# Patient Record
Sex: Male | Born: 1945 | Race: White | Hispanic: No | Smoking: Former smoker
Health system: Southern US, Community
[De-identification: ages and names within clinical notes are randomized; demographics above are authoritative.]

## PROBLEM LIST (undated history)

## (undated) DIAGNOSIS — K219 Gastro-esophageal reflux disease without esophagitis: Secondary | ICD-10-CM

## (undated) DIAGNOSIS — I10 Essential (primary) hypertension: Secondary | ICD-10-CM

## (undated) DIAGNOSIS — E785 Hyperlipidemia, unspecified: Secondary | ICD-10-CM

## (undated) DIAGNOSIS — H3320 Serous retinal detachment, unspecified eye: Secondary | ICD-10-CM

## (undated) DIAGNOSIS — M1990 Unspecified osteoarthritis, unspecified site: Secondary | ICD-10-CM

## (undated) DIAGNOSIS — R911 Solitary pulmonary nodule: Secondary | ICD-10-CM

## (undated) DIAGNOSIS — R32 Unspecified urinary incontinence: Secondary | ICD-10-CM

## (undated) HISTORY — PX: RETINAL DETACHMENT SURGERY: SHX105

## (undated) HISTORY — PX: CATARACT EXTRACTION: SUR2

## (undated) HISTORY — PX: EYE SURGERY: SHX253

## (undated) HISTORY — PX: TONSILLECTOMY: SUR1361

## (undated) HISTORY — PX: HERNIA REPAIR: SHX51

---

## 2014-04-19 HISTORY — PX: HERNIA REPAIR: SHX51

## 2014-06-26 ENCOUNTER — Other Ambulatory Visit: Payer: Self-pay | Admitting: Family Medicine

## 2014-06-26 DIAGNOSIS — I159 Secondary hypertension, unspecified: Secondary | ICD-10-CM

## 2014-06-26 DIAGNOSIS — Z87891 Personal history of nicotine dependence: Secondary | ICD-10-CM

## 2014-06-26 DIAGNOSIS — Z136 Encounter for screening for cardiovascular disorders: Secondary | ICD-10-CM

## 2014-07-02 ENCOUNTER — Ambulatory Visit
Admission: RE | Admit: 2014-07-02 | Discharge: 2014-07-02 | Disposition: A | Discharge status: Home / Self Care | Payer: 59 | Source: Ambulatory Visit | Attending: Family Medicine | Admitting: Family Medicine

## 2014-07-02 DIAGNOSIS — Z87891 Personal history of nicotine dependence: Secondary | ICD-10-CM

## 2014-07-02 DIAGNOSIS — Z136 Encounter for screening for cardiovascular disorders: Secondary | ICD-10-CM

## 2014-07-02 DIAGNOSIS — I159 Secondary hypertension, unspecified: Secondary | ICD-10-CM

## 2014-07-26 NOTE — Progress Notes (Signed)
Please put orders in Epic surgery 08-08-14 pre op 07-31-14 Thanks

## 2014-07-30 ENCOUNTER — Ambulatory Visit: Payer: Self-pay | Admitting: General Surgery

## 2014-07-30 NOTE — H&P (Signed)
Johnny Peck. Fricke 06/27/2014 10:30 AM Location: [facility] Patient #: [id number] DOB: 04-07-1946 Married / Language: English / Race: White Male  History of Present Illness Johnny Peck M. Carra Brindley MD; 06/29/2014 2:34 PM) Patient words: hernia.  The patient is a 69 year old male who presents with an inguinal hernia. He is referred by Dr. Kathyrn Lass for evaluation of a left inguinal hernia. He moved to the area a few months ago. He reports that he developed left groin pain before Christmas. He was picking up a motor for his boat when he felt pain in his left groin. He obtained a hernia belt which decrease the discomfort. The discomfort persisted and he saw a primary care physician who could not really feel a hernia per the patient. He states that he was told he may have a groin pull. He stopped wearing the hernia belt and he states that the bulge returned. The bulge is noticeable when he is on his feet for prolonged period of time. He also describes increased pain with standing or walking or when straining to have a bowel movement. He describes the pain as a burning pain. It does not radiate. He denies any fever, chills, nausea, vomiting, diarrhea or constipation. He does take Flomax. He denies any chest pain, chest pressure, shortness of breath, dyspnea on exertion, TIAs or amaurosis fugax. He has had a prior open repair inthe 80s he thinks on the right side   Other Problems [name], MD; 06/29/2014 2:35 PM) Arthritis Back Pain High blood pressure Hypercholesterolemia Inguinal Hernia Other disease, cancer, significant illness LEFT INGUINAL HERNIA (550.90  K40.90)  Past Surgical History ([name], LPN; 09/14/4130 44:01 AM) Open Inguinal Hernia Surgery Left. Tonsillectomy  Diagnostic Studies History ([name], LPN; 0/27/2536 64:40 AM) Colonoscopy 5-10 years ago  Allergies ([name], LPN; 3/47/4259 56:38 AM) No Known Drug  Allergies03/01/2015  Medication History ([name], LPN; 7/56/4332 95:18 AM) Omeprazole (10MG  Capsule DR, Oral) Active. Lisinopril (10MG  Tablet, Oral) Active. Crestor (10MG  Tablet, Oral) Active. Mobic (15MG  Tablet, Oral) Active. Flomax (0.4MG  Capsule ER 24HR, Oral) Active. Cialis (2.5MG  Tablet, Oral) Active.  Social History ([name], LPN; 8/41/6606 30:16 AM) Alcohol use Occasional alcohol use. Caffeine use Carbonated beverages, Tea. Illicit drug use Prefer to discuss with provider. Tobacco use Former smoker.  Family History Aleatha Borer, LPN; 0/01/9322 55:73 AM) Arthritis Father, Mother. Heart Disease Father, Mother. Hypertension Father, Mother. Prostate Cancer Father.  Review of Systems ([name] LPN; 06/08/2540 70:62 AM) General Not Present- Appetite Loss, Chills, Fatigue, Fever, Night Sweats, Weight Gain and Weight Loss. Skin Not Present- Change in Wart/Mole, Dryness, Hives, Jaundice, New Lesions, Non-Healing Wounds, Rash and Ulcer. HEENT Not Present- Earache, Hearing Loss, Hoarseness, Nose Bleed, Oral Ulcers, Ringing in the Ears, Seasonal Allergies, Sinus Pain, Sore Throat, Visual Disturbances, Wears glasses/contact lenses and Yellow Eyes. Respiratory Not Present- Bloody sputum, Chronic Cough, Difficulty Breathing, Snoring and Wheezing. Cardiovascular Not Present- Chest Pain, Difficulty Breathing Lying Down, Leg Cramps, Palpitations, Rapid Heart Rate, Shortness of Breath and Swelling of Extremities. Gastrointestinal Present- Abdominal Pain. Not Present- Bloating, Bloody Stool, Change in Bowel Habits, Chronic diarrhea, Constipation, Difficulty Swallowing, Excessive gas, Gets full quickly at meals, Hemorrhoids, Indigestion, Nausea, Rectal Pain and Vomiting. Male Genitourinary Present- Urgency and Urine Leakage. Not Present- Blood in Urine, Change in Urinary Stream, Frequency, Impotence, Nocturia and Painful Urination. Musculoskeletal Present- Back  Pain and Joint Pain. Not Present- Joint Stiffness, Muscle Pain, Muscle Weakness and Swelling of Extremities. Neurological Not Present- Decreased Memory, Fainting,  Headaches, Numbness, Seizures, Tingling, Tremor, Trouble walking and Weakness. Psychiatric Not Present- Anxiety, Bipolar, Change in Sleep Pattern, Depression, Fearful and Frequent crying. Endocrine Not Present- Cold Intolerance, Excessive Hunger, Hair Changes, Heat Intolerance, Hot flashes and New Diabetes. Hematology Not Present- Easy Bruising, Excessive bleeding, Gland problems, HIV and Persistent Infections.   Vitals ([name] LPN; 6/50/3546 56:81 AM) 06/27/2014 10:38 AM Weight: 182.4 lb Height: 69.5in Body Surface Area: 2.01 m Body Mass Index: 26.55 kg/m Temp.: 97.12F(Oral)  Pulse: 59 (Regular)  BP: 122/74 (Sitting, Left Arm, Standard)    Physical Exam Johnny Peck M. Kirsten Spearing MD; 06/29/2014 2:31 PM) General Mental Status-Alert. General Appearance-Consistent with stated age. Hydration-Well hydrated. Voice-Normal.  Head and Neck Head-normocephalic, atraumatic with no lesions or palpable masses. Trachea-midline. Thyroid Gland Characteristics - normal size and consistency.  Eye Eyeball - Bilateral-Extraocular movements intact. Sclera/Conjunctiva - Bilateral-No scleral icterus.  Chest and Lung Exam Chest and lung exam reveals -quiet, even and easy respiratory effort with no use of accessory muscles and on auscultation, normal breath sounds, no adventitious sounds and normal vocal resonance. Inspection Chest Wall - Normal. Back - normal.  Breast - Did not examine.  Cardiovascular Cardiovascular examination reveals -normal heart sounds, regular rate and rhythm with no murmurs and normal pedal pulses bilaterally.  Abdomen Inspection Inspection of the abdomen reveals - No Hernias. Skin - Scar - no surgical scars. Palpation/Percussion Palpation and Percussion of the abdomen reveal -  Soft, Non Tender, No Rebound tenderness, No Rigidity (guarding) and No hepatosplenomegaly. Auscultation Auscultation of the abdomen reveals - Bowel sounds normal.  Male Genitourinary Note: both testicles down; old right inguinal incision; no obvious bulge supine. pt examined supine and standing. on standing with valsalva, there is lateral LEFT inguinal bulge. reducible.   Peripheral Vascular Upper Extremity Palpation - Pulses bilaterally normal.  Neurologic Neurologic evaluation reveals -alert and oriented x 3 with no impairment of recent or remote memory. Mental Status-Normal.  Neuropsychiatric The patient's mood and affect are described as -normal. Judgment and Insight-insight is appropriate concerning matters relevant to self.  Musculoskeletal Normal Exam - Left-Upper Extremity Strength Normal and Lower Extremity Strength Normal. Normal Exam - Right-Upper Extremity Strength Normal and Lower Extremity Strength Normal.  Lymphatic Head & Neck  General Head & Neck Lymphatics: Bilateral - Description - Normal. Axillary - Did not examine. Femoral & Inguinal - Did not examine.    Assessment & Plan Johnny Peck M. Kenzy Campoverde MD; 06/27/2014 11:14 AM) LEFT INGUINAL HERNIA (550.90  K40.90) Current Plans  We discussed the etiology of inguinal hernias. We discussed the signs & symptoms of incarceration & strangulation. We discussed non-operative and operative management. We also discussed open and laparoscopic approaches.  I described laparoscopic inguinal hernia repair procedure in detail. The patient was given educational material. We discussed the risks and benefits including but not limited to bleeding, infection, chronic inguinal pain, nerve entrapment, hernia recurrence, mesh complications, hematoma formation, urinary retention, injury to the testicles, numbness in the groin, blood clots, injury to the surrounding structures, and anesthesia risk. We also discussed the typical  post operative recovery course, including no heavy lifting for 4-6 weeks. I explained that the likelihood of improvement of their symptoms is good  Take your flomax the morning of surgery Provided with written educational materials Schedule for Surgery  [name]. Redmond Pulling, MD, FACS General, Bariatric, & Minimally Invasive Surgery [facility], [place]

## 2014-07-30 NOTE — Patient Instructions (Addendum)
Johnny Peck  07/30/2014   Your procedure is scheduled on: 08/08/2014    Report to [facility] Main  Entrance and follow signs to               [name] at    0700 AM.  Call this number if you have problems the morning of surgery [phone]   Remember:  Do not eat food or drink liquids :After Midnight.     Take these medicines the morning of surgery with A SIP OF WATER:   Toprol, Prilosec                                You may not have any metal on your body including hair pins and              piercings  Do not wear jewelry,  lotions, powders or perfumes., deodorant.                            Men may shave face and neck.   Do not bring valuables to the hospital. [place].  Contacts, dentures or bridgework may not be worn into surgery.       Patients discharged the day of surgery will not be allowed to drive home.  Name and phone number of your driver:  Special Instructions: coughing and deep breathing exercises, leg exercises               Please read over the following fact sheets you were given: _____________________________________________________________________             [facility] - Preparing for Surgery Before surgery, you can play an important role.  Because skin is not sterile, your skin needs to be as free of germs as possible.  You can reduce the number of germs on your skin by washing with CHG (chlorahexidine gluconate) soap before surgery.  CHG is an antiseptic cleaner which kills germs and bonds with the skin to continue killing germs even after washing. Please DO NOT use if you have an allergy to CHG or antibacterial soaps.  If your skin becomes reddened/irritated stop using the CHG and inform your nurse when you arrive at Short Stay. Do not shave (including legs and underarms) for at least 48 hours prior to the first CHG shower.  You may shave your face/neck. Please follow  these instructions carefully:  1.  Shower with CHG Soap the night before surgery and the  morning of Surgery.  2.  If you choose to wash your hair, wash your hair first as usual with your  normal  shampoo.  3.  After you shampoo, rinse your hair and body thoroughly to remove the  shampoo.                           4.  Use CHG as you would any other liquid soap.  You can apply chg directly  to the skin and wash                       Gently with a scrungie or clean washcloth.  5.  Apply the  CHG Soap to your body ONLY FROM THE NECK DOWN.   Do not use on face/ open                           Wound or open sores. Avoid contact with eyes, ears mouth and genitals (private parts).                       Wash face,  Genitals (private parts) with your normal soap.             6.  Wash thoroughly, paying special attention to the area where your surgery  will be performed.  7.  Thoroughly rinse your body with warm water from the neck down.  8.  DO NOT shower/wash with your normal soap after using and rinsing off  the CHG Soap.                9.  Pat yourself dry with a clean towel.            10.  Wear clean pajamas.            11.  Place clean sheets on your bed the night of your first shower and do not  sleep with pets. Day of Surgery : Do not apply any lotions/deodorants the morning of surgery.  Please wear clean clothes to the hospital/surgery center.  FAILURE TO FOLLOW THESE INSTRUCTIONS MAY RESULT IN THE CANCELLATION OF YOUR SURGERY PATIENT SIGNATURE_________________________________  NURSE SIGNATURE__________________________________  ________________________________________________________________________

## 2014-07-31 ENCOUNTER — Encounter (HOSPITAL_COMMUNITY): Payer: Self-pay

## 2014-07-31 ENCOUNTER — Encounter (HOSPITAL_COMMUNITY)
Admission: RE | Admit: 2014-07-31 | Discharge: 2014-07-31 | Disposition: A | Discharge status: Home / Self Care | Payer: Medicare Other | Source: Ambulatory Visit | Attending: General Surgery | Admitting: General Surgery

## 2014-07-31 DIAGNOSIS — K4090 Unilateral inguinal hernia, without obstruction or gangrene, not specified as recurrent: Secondary | ICD-10-CM | POA: Diagnosis not present

## 2014-07-31 DIAGNOSIS — Z01812 Encounter for preprocedural laboratory examination: Secondary | ICD-10-CM | POA: Insufficient documentation

## 2014-07-31 HISTORY — DX: Essential (primary) hypertension: I10

## 2014-07-31 HISTORY — DX: Unspecified urinary incontinence: R32

## 2014-07-31 HISTORY — DX: Gastro-esophageal reflux disease without esophagitis: K21.9

## 2014-07-31 HISTORY — DX: Unspecified osteoarthritis, unspecified site: M19.90

## 2014-07-31 LAB — BASIC METABOLIC PANEL
Anion gap: 5 (ref 5–15)
BUN: 27 mg/dL — ABNORMAL HIGH (ref 6–23)
CO2: 26 mmol/L (ref 19–32)
Calcium: 9 mg/dL (ref 8.4–10.5)
Chloride: 105 mmol/L (ref 96–112)
Creatinine, Ser: 1.16 mg/dL (ref 0.50–1.35)
GFR calc Af Amer: 73 mL/min — ABNORMAL LOW (ref >90)
GFR calc non Af Amer: 63 mL/min — ABNORMAL LOW (ref >90)
Glucose, Bld: 94 mg/dL (ref 70–99)
POTASSIUM: 4.3 mmol/L (ref 3.5–5.1)
SODIUM: 136 mmol/L (ref 135–145)

## 2014-07-31 LAB — CBC WITH DIFFERENTIAL/PLATELET
BASOS ABS: 0 10*3/uL (ref 0.0–0.1)
Basophils Relative: 0 % (ref 0–1)
EOS PCT: 1 % (ref 0–5)
Eosinophils Absolute: 0.1 10*3/uL (ref 0.0–0.7)
HCT: 37.2 % — ABNORMAL LOW (ref 39.0–52.0)
Hemoglobin: 12.2 g/dL — ABNORMAL LOW (ref 13.0–17.0)
LYMPHS ABS: 3.7 10*3/uL (ref 0.7–4.0)
Lymphocytes Relative: 42 % (ref 12–46)
MCH: 30.1 pg (ref 26.0–34.0)
MCHC: 32.8 g/dL (ref 30.0–36.0)
MCV: 91.9 fL (ref 78.0–100.0)
Monocytes Absolute: 0.8 10*3/uL (ref 0.1–1.0)
Monocytes Relative: 9 % (ref 3–12)
NEUTROS ABS: 4.3 10*3/uL (ref 1.7–7.7)
Neutrophils Relative %: 48 % (ref 43–77)
PLATELETS: 210 10*3/uL (ref 150–400)
RBC: 4.05 MIL/uL — AB (ref 4.22–5.81)
RDW: 13.8 % (ref 11.5–15.5)
WBC: 8.9 10*3/uL (ref 4.0–10.5)

## 2014-07-31 NOTE — Progress Notes (Signed)
Requested LOV note from 08/24/2013 from [facility].  Had already received EKG done 08/24/2013 and Stress Test 08/25/2012 that is on chart.

## 2014-07-31 NOTE — Progress Notes (Signed)
Placed on chart LOV from [facility] done 08/24/2013  .  In note discussion of EKG done 08/24/2013 .

## 2014-07-31 NOTE — Progress Notes (Signed)
BMP done 07/31/2014 faxed via EPIC to Dr Greer Pickerel.

## 2014-08-07 NOTE — Anesthesia Preprocedure Evaluation (Addendum)
Anesthesia Evaluation  Patient identified by MRN, date of birth, ID band Patient awake    Reviewed: Allergy & Precautions, NPO status , Patient's Chart, lab work & pertinent test results, reviewed documented beta blocker date and time   History of Anesthesia Complications Negative for: history of anesthetic complications  Airway Mallampati: II  TM Distance: >3 FB Neck ROM: Full    Dental no notable dental hx. (+) Dental Advisory Given   Pulmonary former smoker,  breath sounds clear to auscultation  Pulmonary exam normal       Cardiovascular hypertension, Pt. on medications and Pt. on home beta blockers Rhythm:Regular Rate:Normal     Neuro/Psych negative neurological ROS  negative psych ROS   GI/Hepatic Neg liver ROS, GERD-  Medicated and Controlled,  Endo/Other  negative endocrine ROS  Renal/GU negative Renal ROS  negative genitourinary   Musculoskeletal  (+) Arthritis -, Osteoarthritis,    Abdominal   Peds negative pediatric ROS (+)  Hematology negative hematology ROS (+)   Anesthesia Other Findings   Reproductive/Obstetrics negative OB ROS                          Anesthesia Physical Anesthesia Plan  ASA: II  Anesthesia Plan: General   Post-op Pain Management:    Induction: Intravenous  Airway Management Planned: Oral ETT  Additional Equipment:   Intra-op Plan:   Post-operative Plan: Extubation in OR  Informed Consent: I have reviewed the patients History and Physical, chart, labs and discussed the procedure including the risks, benefits and alternatives for the proposed anesthesia with the patient or authorized representative who has indicated his/her understanding and acceptance.   Dental advisory given  Plan Discussed with: CRNA  Anesthesia Plan Comments:         Anesthesia Quick Evaluation

## 2014-08-08 NOTE — Progress Notes (Signed)
08/24/2013-Last office visit and EKG from [facility] -Dr. Kyra Searles on chart.

## 2014-08-09 ENCOUNTER — Encounter (HOSPITAL_COMMUNITY): Payer: Self-pay | Admitting: *Deleted

## 2014-08-09 ENCOUNTER — Encounter (HOSPITAL_COMMUNITY)
Admission: RE | Disposition: A | Discharge status: Home / Self Care | Payer: Self-pay | Source: Ambulatory Visit | Attending: General Surgery

## 2014-08-09 ENCOUNTER — Ambulatory Visit (HOSPITAL_COMMUNITY): Payer: Medicare Other | Admitting: Anesthesiology

## 2014-08-09 ENCOUNTER — Ambulatory Visit (HOSPITAL_COMMUNITY)
Admission: RE | Admit: 2014-08-09 | Discharge: 2014-08-09 | Disposition: A | Discharge status: Home / Self Care | Payer: Medicare Other | Source: Ambulatory Visit | Attending: General Surgery | Admitting: General Surgery

## 2014-08-09 DIAGNOSIS — K219 Gastro-esophageal reflux disease without esophagitis: Secondary | ICD-10-CM | POA: Diagnosis not present

## 2014-08-09 DIAGNOSIS — M1990 Unspecified osteoarthritis, unspecified site: Secondary | ICD-10-CM | POA: Insufficient documentation

## 2014-08-09 DIAGNOSIS — K4090 Unilateral inguinal hernia, without obstruction or gangrene, not specified as recurrent: Secondary | ICD-10-CM | POA: Diagnosis not present

## 2014-08-09 DIAGNOSIS — I10 Essential (primary) hypertension: Secondary | ICD-10-CM | POA: Insufficient documentation

## 2014-08-09 DIAGNOSIS — D176 Benign lipomatous neoplasm of spermatic cord: Secondary | ICD-10-CM | POA: Insufficient documentation

## 2014-08-09 DIAGNOSIS — Z7982 Long term (current) use of aspirin: Secondary | ICD-10-CM | POA: Insufficient documentation

## 2014-08-09 DIAGNOSIS — Z87891 Personal history of nicotine dependence: Secondary | ICD-10-CM | POA: Insufficient documentation

## 2014-08-09 HISTORY — PX: INGUINAL HERNIA REPAIR: SHX194

## 2014-08-09 SURGERY — REPAIR, HERNIA, INGUINAL, LAPAROSCOPIC
Anesthesia: General | Laterality: Left

## 2014-08-09 MED ORDER — MIDAZOLAM HCL 5 MG/5ML IJ SOLN
INTRAMUSCULAR | Status: DC | PRN
  Administered 2014-08-09: 2 mg via INTRAVENOUS

## 2014-08-09 MED ORDER — 0.9 % SODIUM CHLORIDE (POUR BTL) OPTIME
TOPICAL | Status: DC | PRN
  Administered 2014-08-09: 1000 mL

## 2014-08-09 MED ORDER — SODIUM CHLORIDE 0.9 % IJ SOLN
INTRAMUSCULAR | Status: DC | PRN
  Administered 2014-08-09: 30 mL via INTRAVENOUS

## 2014-08-09 MED ORDER — GLYCOPYRROLATE 0.2 MG/ML IJ SOLN
INTRAMUSCULAR | Status: DC | PRN
  Administered 2014-08-09: 0.6 mg via INTRAVENOUS

## 2014-08-09 MED ORDER — SUCCINYLCHOLINE CHLORIDE 20 MG/ML IJ SOLN
INTRAMUSCULAR | Status: DC | PRN
  Administered 2014-08-09: 80 mg via INTRAVENOUS

## 2014-08-09 MED ORDER — CHLORHEXIDINE GLUCONATE 4 % EX LIQD: 1.0000 "application " | Freq: Once | CUTANEOUS | Status: DC

## 2014-08-09 MED ORDER — LIDOCAINE HCL (CARDIAC) 20 MG/ML IV SOLN
INTRAVENOUS | Status: DC | PRN
  Administered 2014-08-09: 80 mg via INTRAVENOUS

## 2014-08-09 MED ORDER — ONDANSETRON HCL 4 MG/2ML IJ SOLN: 4.0000 mg | Freq: Once | INTRAMUSCULAR | Status: DC | PRN

## 2014-08-09 MED ORDER — EPHEDRINE SULFATE 50 MG/ML IJ SOLN
INTRAMUSCULAR | Status: DC | PRN
  Administered 2014-08-09 (×3): 5 mg via INTRAVENOUS

## 2014-08-09 MED ORDER — FENTANYL CITRATE (PF) 100 MCG/2ML IJ SOLN
INTRAMUSCULAR | Status: DC | PRN
  Administered 2014-08-09: 50 ug via INTRAVENOUS
  Administered 2014-08-09: 100 ug via INTRAVENOUS
  Administered 2014-08-09: 50 ug via INTRAVENOUS

## 2014-08-09 MED ORDER — BUPIVACAINE LIPOSOME 1.3 % IJ SUSP
20.0000 mL | Freq: Once | INTRAMUSCULAR | Status: AC
  Administered 2014-08-09: 20 mL
  Filled 2014-08-09: qty 20

## 2014-08-09 MED ORDER — OXYCODONE-ACETAMINOPHEN 5-325 MG PO TABS: 1.0000 | ORAL_TABLET | ORAL | Status: DC | PRN

## 2014-08-09 MED ORDER — LIDOCAINE HCL (CARDIAC) 20 MG/ML IV SOLN
INTRAVENOUS | Status: AC
  Filled 2014-08-09: qty 5

## 2014-08-09 MED ORDER — CHLORHEXIDINE GLUCONATE 4 % EX LIQD
1.0000 | Freq: Once | CUTANEOUS | Status: DC
Start: 2014-08-10 — End: 2014-08-09

## 2014-08-09 MED ORDER — ONDANSETRON HCL 4 MG/2ML IJ SOLN
INTRAMUSCULAR | Status: AC
  Filled 2014-08-09: qty 2

## 2014-08-09 MED ORDER — ROCURONIUM BROMIDE 100 MG/10ML IV SOLN
INTRAVENOUS | Status: AC
  Filled 2014-08-09: qty 1

## 2014-08-09 MED ORDER — NEOSTIGMINE METHYLSULFATE 10 MG/10ML IV SOLN
INTRAVENOUS | Status: DC | PRN
Start: 2014-08-09 — End: 2014-08-09
  Administered 2014-08-09: 4 mg via INTRAVENOUS

## 2014-08-09 MED ORDER — PHENYLEPHRINE HCL 10 MG/ML IJ SOLN
INTRAMUSCULAR | Status: DC | PRN
  Administered 2014-08-09: 40 ug via INTRAVENOUS

## 2014-08-09 MED ORDER — PROPOFOL 10 MG/ML IV BOLUS
INTRAVENOUS | Status: DC | PRN
  Administered 2014-08-09: 150 mg via INTRAVENOUS

## 2014-08-09 MED ORDER — FENTANYL CITRATE (PF) 100 MCG/2ML IJ SOLN: 25.0000 ug | INTRAMUSCULAR | Status: DC | PRN

## 2014-08-09 MED ORDER — PROPOFOL 10 MG/ML IV BOLUS
INTRAVENOUS | Status: AC
  Filled 2014-08-09: qty 20

## 2014-08-09 MED ORDER — ROCURONIUM BROMIDE 100 MG/10ML IV SOLN
INTRAVENOUS | Status: DC | PRN
  Administered 2014-08-09: 10 mg via INTRAVENOUS
  Administered 2014-08-09: 20 mg via INTRAVENOUS
  Administered 2014-08-09: 10 mg via INTRAVENOUS

## 2014-08-09 MED ORDER — GLYCOPYRROLATE 0.2 MG/ML IJ SOLN
INTRAMUSCULAR | Status: AC
  Filled 2014-08-09: qty 3

## 2014-08-09 MED ORDER — ONDANSETRON HCL 4 MG/2ML IJ SOLN
INTRAMUSCULAR | Status: DC | PRN
  Administered 2014-08-09: 4 mg via INTRAVENOUS

## 2014-08-09 MED ORDER — MIDAZOLAM HCL 2 MG/2ML IJ SOLN
INTRAMUSCULAR | Status: AC
  Filled 2014-08-09: qty 2

## 2014-08-09 MED ORDER — OXYCODONE HCL 5 MG PO TABS
ORAL_TABLET | ORAL | Status: AC
  Filled 2014-08-09: qty 2

## 2014-08-09 MED ORDER — CEFAZOLIN SODIUM-DEXTROSE 2-3 GM-% IV SOLR
INTRAVENOUS | Status: AC
  Filled 2014-08-09: qty 50

## 2014-08-09 MED ORDER — FENTANYL CITRATE (PF) 250 MCG/5ML IJ SOLN
INTRAMUSCULAR | Status: AC
  Filled 2014-08-09: qty 5

## 2014-08-09 MED ORDER — LACTATED RINGERS IV SOLN
INTRAVENOUS | Status: DC
  Administered 2014-08-09: 1000 mL via INTRAVENOUS

## 2014-08-09 MED ORDER — OXYCODONE HCL 5 MG PO TABS
5.0000 mg | ORAL_TABLET | ORAL | Status: DC | PRN
  Administered 2014-08-09: 10 mg via ORAL

## 2014-08-09 MED ORDER — CEFAZOLIN SODIUM-DEXTROSE 2-3 GM-% IV SOLR
2.0000 g | INTRAVENOUS | Status: AC
  Administered 2014-08-09: 2 g via INTRAVENOUS

## 2014-08-09 SURGICAL SUPPLY — 35 items
APPLIER CLIP 5 13 M/L LIGAMAX5 (MISCELLANEOUS) ×2
BANDAGE ADH SHEER 1  50/CT (GAUZE/BANDAGES/DRESSINGS) IMPLANT
BENZOIN TINCTURE PRP APPL 2/3 (GAUZE/BANDAGES/DRESSINGS) IMPLANT
CLIP APPLIE 5 13 M/L LIGAMAX5 (MISCELLANEOUS) ×1 IMPLANT
DECANTER SPIKE VIAL GLASS SM (MISCELLANEOUS) ×2 IMPLANT
DEVICE SECURE STRAP 25 ABSORB (INSTRUMENTS) ×2 IMPLANT
DRAPE LAPAROSCOPIC ABDOMINAL (DRAPES) ×2 IMPLANT
DRSG TEGADERM 2-3/8X2-3/4 SM (GAUZE/BANDAGES/DRESSINGS) IMPLANT
DRSG TEGADERM 4X4.75 (GAUZE/BANDAGES/DRESSINGS) IMPLANT
ELECT REM PT RETURN 9FT ADLT (ELECTROSURGICAL) ×2
ELECTRODE REM PT RTRN 9FT ADLT (ELECTROSURGICAL) ×1 IMPLANT
GLOVE BIO SURGEON STRL SZ7.5 (GLOVE) ×2 IMPLANT
GLOVE BIOGEL M STRL SZ7.5 (GLOVE) IMPLANT
GLOVE INDICATOR 8.0 STRL GRN (GLOVE) ×2 IMPLANT
GOWN STRL REUS W/TWL XL LVL3 (GOWN DISPOSABLE) ×8 IMPLANT
KIT BASIN OR (CUSTOM PROCEDURE TRAY) ×2 IMPLANT
MESH ULTRAPRO 3X6 7.6X15CM (Mesh General) ×2 IMPLANT
NS IRRIG 1000ML POUR BTL (IV SOLUTION) ×2 IMPLANT
POUCH SPECIMEN RETRIEVAL 10MM (ENDOMECHANICALS) ×2 IMPLANT
RELOAD STAPLE HERNIA 4.0 BLUE (INSTRUMENTS) IMPLANT
RELOAD STAPLE HERNIA 4.8 BLK (STAPLE) IMPLANT
SCALPEL HARMONIC ACE (MISCELLANEOUS) IMPLANT
SCISSORS LAP 5X35 DISP (ENDOMECHANICALS) IMPLANT
SET IRRIG TUBING LAPAROSCOPIC (IRRIGATION / IRRIGATOR) IMPLANT
SOLUTION ANTI FOG 6CC (MISCELLANEOUS) IMPLANT
STAPLER HERNIA 12 8.5 360D (INSTRUMENTS) IMPLANT
STRIP CLOSURE SKIN 1/2X4 (GAUZE/BANDAGES/DRESSINGS) IMPLANT
SUT MNCRL AB 4-0 PS2 18 (SUTURE) ×2 IMPLANT
TOWEL OR 17X26 10 PK STRL BLUE (TOWEL DISPOSABLE) ×2 IMPLANT
TOWEL OR NON WOVEN STRL DISP B (DISPOSABLE) ×2 IMPLANT
TRAY LAPAROSCOPIC (CUSTOM PROCEDURE TRAY) ×2 IMPLANT
TROCAR BLADELESS OPT 5 75 (ENDOMECHANICALS) ×2 IMPLANT
TROCAR SLEEVE XCEL 5X75 (ENDOMECHANICALS) ×2 IMPLANT
TROCAR XCEL BLUNT TIP 100MML (ENDOMECHANICALS) ×2 IMPLANT
TUBING INSUFFLATION 10FT LAP (TUBING) ×2 IMPLANT

## 2014-08-09 NOTE — Interval H&P Note (Signed)
History and Physical Interval Note:  08/09/2014 8:50 AM  Johnny Peck  has presented today for surgery, with the diagnosis of Left Inguinal Hernia  The various methods of treatment have been discussed with the patient and family. After consideration of risks, benefits and other options for treatment, the patient has consented to  Procedure(s): [place] (Left) as a surgical intervention .  The patient's history has been reviewed, patient examined, no change in status, stable for surgery.  I have reviewed the patient's chart and labs.  Questions were answered to the patient's satisfaction.    [name]. Redmond Pulling, MD, [place], Bariatric, & Minimally Invasive Surgery [facility], [place]   [facility]

## 2014-08-09 NOTE — H&P (View-Only) (Signed)
Johnny Peck. Bord 06/27/2014 10:30 AM Location: [facility] Patient #: [id number] DOB: [birth year] Married / Language: English / Race: White Male  History of Present Illness [name] M. Shaquela Weichert MD; 06/29/2014 2:34 PM) Patient words: hernia.  The patient is a 69 year old male who presents with an inguinal hernia. He is referred by Dr. Kathyrn Lass for evaluation of a left inguinal hernia. He moved to the area a few months ago. He reports that he developed left groin pain before Christmas. He was picking up a motor for his boat when he felt pain in his left groin. He obtained a hernia belt which decrease the discomfort. The discomfort persisted and he saw a primary care physician who could not really feel a hernia per the patient. He states that he was told he may have a groin pull. He stopped wearing the hernia belt and he states that the bulge returned. The bulge is noticeable when he is on his feet for prolonged period of time. He also describes increased pain with standing or walking or when straining to have a bowel movement. He describes the pain as a burning pain. It does not radiate. He denies any fever, chills, nausea, vomiting, diarrhea or constipation. He does take Flomax. He denies any chest pain, chest pressure, shortness of breath, dyspnea on exertion, TIAs or amaurosis fugax. He has had a prior open repair inthe 80s he thinks on the right side   Other Problems [name], MD; 06/29/2014 2:35 PM) Arthritis Back Pain High blood pressure Hypercholesterolemia Inguinal Hernia Other disease, cancer, significant illness LEFT INGUINAL HERNIA (550.90  K40.90)  Past Surgical History ([name], LPN; 07/10/4008 27:25 AM) Open Inguinal Hernia Surgery Left. Tonsillectomy  Diagnostic Studies History ([name], LPN; 3/66/4403 47:42 AM) Colonoscopy 5-10 years ago  Allergies ([name], LPN; 5/95/6387 56:43 AM) No Known Drug  Allergies03/01/2015  Medication History ([name], LPN; 07/16/5186 41:66 AM) Omeprazole (10MG  Capsule DR, Oral) Active. Lisinopril (10MG  Tablet, Oral) Active. Crestor (10MG  Tablet, Oral) Active. Mobic (15MG  Tablet, Oral) Active. Flomax (0.4MG  Capsule ER 24HR, Oral) Active. Cialis (2.5MG  Tablet, Oral) Active.  Social History ([name], LPN; 0/63/0160 10:93 AM) Alcohol use Occasional alcohol use. Caffeine use Carbonated beverages, Tea. Illicit drug use Prefer to discuss with provider. Tobacco use Former smoker.  Family History Aleatha Borer, LPN; 2/35/5732 20:25 AM) Arthritis Father, Mother. Heart Disease Father, Mother. Hypertension Father, Mother. Prostate Cancer Father.  Review of Systems ([name] LPN; 08/13/621 76:28 AM) General Not Present- Appetite Loss, Chills, Fatigue, Fever, Night Sweats, Weight Gain and Weight Loss. Skin Not Present- Change in Wart/Mole, Dryness, Hives, Jaundice, New Lesions, Non-Healing Wounds, Rash and Ulcer. HEENT Not Present- Earache, Hearing Loss, Hoarseness, Nose Bleed, Oral Ulcers, Ringing in the Ears, Seasonal Allergies, Sinus Pain, Sore Throat, Visual Disturbances, Wears glasses/contact lenses and Yellow Eyes. Respiratory Not Present- Bloody sputum, Chronic Cough, Difficulty Breathing, Snoring and Wheezing. Cardiovascular Not Present- Chest Pain, Difficulty Breathing Lying Down, Leg Cramps, Palpitations, Rapid Heart Rate, Shortness of Breath and Swelling of Extremities. Gastrointestinal Present- Abdominal Pain. Not Present- Bloating, Bloody Stool, Change in Bowel Habits, Chronic diarrhea, Constipation, Difficulty Swallowing, Excessive gas, Gets full quickly at meals, Hemorrhoids, Indigestion, Nausea, Rectal Pain and Vomiting. Male Genitourinary Present- Urgency and Urine Leakage. Not Present- Blood in Urine, Change in Urinary Stream, Frequency, Impotence, Nocturia and Painful Urination. Musculoskeletal Present- Back  Pain and Joint Pain. Not Present- Joint Stiffness, Muscle Pain, Muscle Weakness and Swelling of Extremities. Neurological Not Present- Decreased Memory, Fainting,  Headaches, Numbness, Seizures, Tingling, Tremor, Trouble walking and Weakness. Psychiatric Not Present- Anxiety, Bipolar, Change in Sleep Pattern, Depression, Fearful and Frequent crying. Endocrine Not Present- Cold Intolerance, Excessive Hunger, Hair Changes, Heat Intolerance, Hot flashes and New Diabetes. Hematology Not Present- Easy Bruising, Excessive bleeding, Gland problems, HIV and Persistent Infections.   Vitals ([name] LPN; 7/67/2094 70:96 AM) 06/27/2014 10:38 AM Weight: 182.4 lb Height: 69.5in Body Surface Area: 2.01 m Body Mass Index: 26.55 kg/m Temp.: 97.62F(Oral)  Pulse: 59 (Regular)  BP: 122/74 (Sitting, Left Arm, Standard)    Physical Exam [name] M. Daemian Gahm MD; 06/29/2014 2:31 PM) General Mental Status-Alert. General Appearance-Consistent with stated age. Hydration-Well hydrated. Voice-Normal.  Head and Neck Head-normocephalic, atraumatic with no lesions or palpable masses. Trachea-midline. Thyroid Gland Characteristics - normal size and consistency.  Eye Eyeball - Bilateral-Extraocular movements intact. Sclera/Conjunctiva - Bilateral-No scleral icterus.  Chest and Lung Exam Chest and lung exam reveals -quiet, even and easy respiratory effort with no use of accessory muscles and on auscultation, normal breath sounds, no adventitious sounds and normal vocal resonance. Inspection Chest Wall - Normal. Back - normal.  Breast - Did not examine.  Cardiovascular Cardiovascular examination reveals -normal heart sounds, regular rate and rhythm with no murmurs and normal pedal pulses bilaterally.  Abdomen Inspection Inspection of the abdomen reveals - No Hernias. Skin - Scar - no surgical scars. Palpation/Percussion Palpation and Percussion of the abdomen reveal -  Soft, Non Tender, No Rebound tenderness, No Rigidity (guarding) and No hepatosplenomegaly. Auscultation Auscultation of the abdomen reveals - Bowel sounds normal.  Male Genitourinary Note: both testicles down; old right inguinal incision; no obvious bulge supine. pt examined supine and standing. on standing with valsalva, there is lateral LEFT inguinal bulge. reducible.   Peripheral Vascular Upper Extremity Palpation - Pulses bilaterally normal.  Neurologic Neurologic evaluation reveals -alert and oriented x 3 with no impairment of recent or remote memory. Mental Status-Normal.  Neuropsychiatric The patient's mood and affect are described as -normal. Judgment and Insight-insight is appropriate concerning matters relevant to self.  Musculoskeletal Normal Exam - Left-Upper Extremity Strength Normal and Lower Extremity Strength Normal. Normal Exam - Right-Upper Extremity Strength Normal and Lower Extremity Strength Normal.  Lymphatic Head & Neck  General Head & Neck Lymphatics: Bilateral - Description - Normal. Axillary - Did not examine. Femoral & Inguinal - Did not examine.    Assessment & Plan [name] M. Viyan Rosamond MD; 06/27/2014 11:14 AM) LEFT INGUINAL HERNIA (550.90  K40.90) Current Plans  We discussed the etiology of inguinal hernias. We discussed the signs & symptoms of incarceration & strangulation. We discussed non-operative and operative management. We also discussed open and laparoscopic approaches.  I described laparoscopic inguinal hernia repair procedure in detail. The patient was given educational material. We discussed the risks and benefits including but not limited to bleeding, infection, chronic inguinal pain, nerve entrapment, hernia recurrence, mesh complications, hematoma formation, urinary retention, injury to the testicles, numbness in the groin, blood clots, injury to the surrounding structures, and anesthesia risk. We also discussed the typical  post operative recovery course, including no heavy lifting for 4-6 weeks. I explained that the likelihood of improvement of their symptoms is good  Take your flomax the morning of surgery Provided with written educational materials Schedule for Surgery  [name]. Redmond Pulling, MD, FACS General, Bariatric, & Minimally Invasive Surgery [facility], [place]

## 2014-08-09 NOTE — Op Note (Signed)
08/09/2014  Dawayne Ohair 03-10-1946   PREOPERATIVE DIAGNOSIS: left inguinal hernia.   POSTOPERATIVE DIAGNOSIS: left indirect inguinal hernia.   PROCEDURE: Laparoscopic repair of left indirect inguinal hernia with  mesh (TAPP).   SURGEON: [name]. Redmond Pulling, MD   ASSISTANT SURGEON: None.   ANESTHESIA: General plus local consisting of 0.25% Marcaine with epi. And 50cc exparel  ESTIMATED BLOOD LOSS: Minimal.   FINDINGS: The patient had a defect lateral to the inferior epigastric vessels.  It was repaired using a 3 inch x 6  inch piece of Ethicon UltraPro mesh.   SPECIMEN: large cord lipoma/retroperitoneal fat  INDICATIONS FOR PROCEDURE: 69 year old gentleman presented with left groin pain and discomfort as well as intermittent bulge for the past several months. On exam he had an inguinal hernia consistent with an indirect location. He desired surgical repair. The risks and benefits including but not limited to bleeding, infection, chronic inguinal pain, nerve entrapment, hernia recurrence, mesh complications, hematoma formation, urinary retention, injury to the testicles or the ovaries, numbness in the groin, blood clots, injury to the surrounding structures, and anesthesia risk was discussed with the patient.  DESCRIPTION OF PROCEDURE: After obtaining verbal consent and marking  the left groin in the holding area with the patient confirming the  operative site, the patient was then taken back to the operating room, placed  supine on the operating room table. General endotracheal anesthesia was  established. The patient had emptied their bladder prior to going back to  the operating room. Sequential compression devices were placed. The  abdomen and groin were prepped and draped in the usual standard surgical  fashion with ChloraPrep. The patient received  IV antibiotics prior to the incision. A surgical time-out was performed.  Local was infiltrated at the base of the umbilicus.    Next, a 1-cm vertical infraumbilical incision was made with a #11 blade. The fascia  was grasped and lifted anteriorly. Next, the fascia was incised, and  the abdominal cavity was entered. Pursestring suture was placed around  the fascial edges using a 0 Vicryl. A 12-mm Hasson trocar was placed.  Pneumoperitoneum was smoothly established up to a patient pressure of 15  mmHg. Laparoscope was advanced. There was no evidence of a  contralateral hernia. Part of his sigmoid colon epiploic appendage was tethered to the peritoneum. The patient had a defect lateral to  the inferior epigastric vessel, consistent with an indirect left  hernia. Two 5-mm trocars were placed, one on the right, one on the left  in the midclavicular line slightly above the level of the umbilicus all  under direct visualization. After local had been infiltrated, I then  made incision along the peritoneum on the left, starting 2 inches above  the anterior superior iliac spine and caring it medial  toward the median umbilical ligament in a lazy S configuration using  Endo Shears with electrocautery. The peritoneal flap was then gently  dissected downward from the anterior abdominal wall taking care not to  injure the inferior epigastric vessels. The pubic bone was identified.  The testicular vessels were identified.  He had a generous cord lipoma telescoping down the inguinal canal loosely attached to the cord structures. I separated this cord lipoma from the cord structures using traction and countertraction. Interestingly this cord lipoma structure once I had separated it from the cord structures was still tethered and attached to the left lateral abdominal wall almost appearing as if it was coming up from the retroperitoneum. It was definitely lateral  to the iliacs and appeared to be coming more from a lateral position. At this point I turned my attention to reducing the indirect sac. Using  traction and counter traction with  short graspers, I reduced the sac in  its entirety. The testicular vessels had been identified and preserved. The vas deferens was identified and preserved, and the hernia sac was stripped from those to  surrounding structures. I then went about creating a large pocket by  lifting the peritoneum of the pelvic floor. I took great care not to  injure the iliac vessels. There was this still large amount of cord lipoma or pre-peritoneal fat that was somewhat anchored to the lateral abdominal wall from a retroperitoneal position. Using traction and countertraction I stripped it to just had one 1 mm vessel anchoring it. I reconfirmed my anatomy. The vas deferens and the testicular vessels were intact and isolated from this pre-peritoneal fat. The inferior epigastric vessels were well away. The iliac vessels were well away as well. Dr. Hassell Done had stuck his head into the operating room at my request and agreed with my assessment that this was just an abnormal amount of pre-peritoneal fat. There are no structures that had come across and isolating it in trying to remove it. The only thing connecting it at this point was this 1 mm vessel. I decided to place a ligament clip on the staying side and one on the specimen side and then cut in between with EndoShears. This freed this pre-peritoneal fat. I placed it in an Endo Catch bag and removed it from the abdomen.    Exparel was injected 2 finger breadths below and medial to the anterior superior iliac spine as well as along the left groin prior to placing the mesh. I then obtained a piece of Ethicon UltraPro mesh 3 inch x 6 inch, placed it through the Hasson trocar, half of it covered medial  to the inferior epigastric vessels and half of it lateral to the inferior epigastric vessels. The defect was well  covered with the mesh. I then secured the mesh to the abdominal wall  using an Ethicon secure strap tack. Tacks were placed on superior medial aspect of mesh, one  tack on each side of the inferior epigastric  vessel and two tacks out laterally. No tacks were placed below the  shelving edge of the inguinal ligament. Pneumoperitoneum was reduced  to 8 mmHg. I then brought the peritoneal flap back up to the abdominal  wall and tacked it to the abdominal wall using 4 tacks. There was no  defect in the peritoneum, and the mesh was well covered. I removed the  Hasson trocar and tied down the previously placed pursestring suture. I did place another interrupted 0 Vicryl suture at the umbilical fascia due to a small air leak. The closure was viewed laparoscopically. There was no evidence of  fascial defect. There was no air leak at the umbilicus. There was no  evidence of injury to surrounding structures. Additional Exparel was infiltrated at the umbilical fascia. Pneumoperitoneum was  released, and the remaining trocars were removed. All skin incisions  were closed with a 4-0 Monocryl in a subcuticular fashion followed by  application of Dermabond. All needle, instrument, and sponge counts  were correct x2. There are no immediate complications. The patient  tolerated the procedure well. The patient was extubated and taken to the  recovery room in stable condition.

## 2014-08-09 NOTE — Progress Notes (Addendum)
Patient is experiencing more chronic lower back pain than surgical pain after laparoscopic left inguinal hernia repair. Up ambulating in hallways which helps his lower back. Unable to void. Bladder scanned for 123 ml. Encouraged to hydrate slowly to prevent overdistention of bladder.  1500  Patient is up walking in the hallway. Unable to void at this time. States he is just beginning to feel urge to void.  1630  Patient is up walking in hallway. Unable to void. Bladder scanned for 174 ml.

## 2014-08-09 NOTE — Anesthesia Postprocedure Evaluation (Signed)
  Anesthesia Post-op Note  Patient: Johnny Peck  Procedure(s) Performed: Procedure(s): LAPAROSCOPIC REPAIR OF LEFT INGUINAL HERNIA WITH MESH (Left)  Patient Location: PACU  Anesthesia Type:General  Level of Consciousness: awake and alert   Airway and Oxygen Therapy: Patient Spontanous Breathing  Post-op Pain: mild  Post-op Assessment: Post-op Vital signs reviewed, Patient's Cardiovascular Status Stable and Respiratory Function Stable  Post-op Vital Signs: Reviewed  Filed Vitals:   08/09/14 1222  BP: 101/72  Pulse: 55  Temp: 36.4 C  Resp: 14    Complications: No apparent anesthesia complications

## 2014-08-09 NOTE — Discharge Instructions (Signed)
[facility]  UMBILICAL OR INGUINAL HERNIA REPAIR: POST OP INSTRUCTIONS  Always review your discharge instruction sheet given to you by the facility where your surgery was performed. IF YOU HAVE DISABILITY OR FAMILY LEAVE FORMS, YOU MUST BRING THEM TO THE OFFICE FOR PROCESSING.   DO NOT GIVE THEM TO YOUR DOCTOR.  1. A  prescription for pain medication may be given to you upon discharge.  Take your pain medication as prescribed, if needed.  If narcotic pain medicine is not needed, then you may take acetaminophen (Tylenol) or ibuprofen (Advil) as needed. 2. Take your usually prescribed medications unless otherwise directed. 3. If you need a refill on your pain medication, please contact your pharmacy.  They will contact our office to request authorization. Prescriptions will not be filled after 5 pm or on week-ends. 4. You should follow a light diet the first 24 hours after arrival home, such as soup and crackers, etc.  Be sure to include lots of fluids daily.  Resume your normal diet the day after surgery. 5. Most patients will experience some swelling and bruising around the umbilicus or in the groin and scrotum.  Ice packs and reclining will help.  Swelling and bruising can take several days to resolve.  6. It is common to experience some constipation if taking pain medication after surgery.  Increasing fluid intake and taking a stool softener (such as Colace) will usually help or prevent this problem from occurring.  A mild laxative (Milk of Magnesia or Miralax) should be taken according to package directions if there are no bowel movements after 48 hours. 7.  If your surgeon used skin glue on the incision, you may shower in 24 hours.  The glue will flake off over the next 2-3 weeks.  Any sutures or staples will be removed at the office during your follow-up visit. 8. ACTIVITIES:  You may resume regular (light) daily activities beginning the next day--such as daily self-care,  walking, climbing stairs--gradually increasing activities as tolerated.  You may have sexual intercourse when it is comfortable.  Refrain from any heavy lifting or straining until approved by your doctor. a. You may drive when you are no longer taking prescription pain medication, you can comfortably wear a seatbelt, and you can safely maneuver your car and apply brakes. b. RETURN TO WORK:  9. You should see your doctor in the office for a follow-up appointment approximately 2-3 weeks after your surgery.  Make sure that you call for this appointment within a day or two after you arrive home to insure a convenient appointment time. 10. OTHER INSTRUCTIONS: DO NOT LIFT, PUSH, OR PULL ANYTHING GREATER THAN 10 POUNDS FOR 6 WEEKS    WHEN TO CALL YOUR DOCTOR: 1. Fever over 101.0 2. Inability to urinate 3. Nausea and/or vomiting 4. Extreme swelling or bruising 5. Continued bleeding from incision. 6. Increased pain, redness, or drainage from the incision  The clinic staff is available to answer your questions during regular business hours.  Please dont hesitate to call and ask to speak to one of the nurses for clinical concerns.  If you have a medical emergency, go to the nearest emergency room or call 911.  A surgeon from [facility] is always on call at the hospital   [address], [place], [place], [place]  [address] ?  [address], [place], [place]   [address] [phone] ? [phone] ? FAX [phone] Web site: [email or web address]  General Anesthesia, Care After Refer to this sheet in the next few weeks. These instructions provide you with information on caring for yourself after your procedure. Your health care provider may also give you more specific instructions. Your treatment has been planned according to current medical practices, but problems sometimes occur. Call your health care provider if you have any problems or questions after your  procedure. WHAT TO EXPECT AFTER THE PROCEDURE After the procedure, it is typical to experience:  Sleepiness.  Nausea and vomiting. HOME CARE INSTRUCTIONS  For the first 24 hours after general anesthesia:  Have a responsible person with you.  Do not drive a car. If you are alone, do not take public transportation.  Do not drink alcohol.  Do not take medicine that has not been prescribed by your health care provider.  Do not sign important papers or make important decisions.  You may resume a normal diet and activities as directed by your health care provider.  Change bandages (dressings) as directed.  If you have questions or problems that seem related to general anesthesia, call the hospital and ask for the anesthetist or anesthesiologist on call. SEEK MEDICAL CARE IF:  You have nausea and vomiting that continue the day after anesthesia.  You develop a rash. SEEK IMMEDIATE MEDICAL CARE IF:   You have difficulty breathing.  You have chest pain.  You have any allergic problems. Document Released: 07/12/2000 Document Revised: 04/10/2013 Document Reviewed: 10/19/2012 [facility] Patient Information 2015 [place], [place]. This information is not intended to replace advice given to you by your health care provider. Make sure you discuss any questions you have with your health care provider.

## 2014-08-09 NOTE — Anesthesia Procedure Notes (Signed)
Procedure Name: Intubation Date/Time: 08/09/2014 9:13 AM Performed by: Anne Fu Pre-anesthesia Checklist: Patient identified, Emergency Drugs available, Suction available and Patient being monitored Patient Re-evaluated:Patient Re-evaluated prior to inductionOxygen Delivery Method: Circle System Utilized Preoxygenation: Pre-oxygenation with 100% oxygen Intubation Type: IV induction Ventilation: Mask ventilation without difficulty Laryngoscope Size: Mac and 4 Grade View: Grade I Tube type: Oral Tube size: 7.5 mm Number of attempts: 1 Airway Equipment and Method: Stylet Placement Confirmation: ETT inserted through vocal cords under direct vision,  positive ETCO2 and breath sounds checked- equal and bilateral Secured at: 22 cm Tube secured with: Tape Dental Injury: Teeth and Oropharynx as per pre-operative assessment

## 2014-08-09 NOTE — Transfer of Care (Signed)
Immediate Anesthesia Transfer of Care Note  Patient: Johnny Peck  Procedure(s) Performed: Procedure(s): LAPAROSCOPIC REPAIR OF LEFT INGUINAL HERNIA WITH MESH (Left)  Patient Location: PACU  Anesthesia Type:General  Level of Consciousness:  sedated, patient cooperative and responds to stimulation  Airway & Oxygen Therapy:Patient Spontanous Breathing and Patient connected to face mask oxgen  Post-op Assessment:  Report given to PACU RN and Post -op Vital signs reviewed and stable  Post vital signs:  Reviewed and stable  Last Vitals:  Filed Vitals:   08/09/14 0748  BP: 127/81  Pulse: 63  Temp: 36.2 C  Resp: 16    Complications: No apparent anesthesia complications

## 2014-08-12 ENCOUNTER — Encounter (HOSPITAL_COMMUNITY): Payer: Self-pay | Admitting: General Surgery

## 2014-08-20 DIAGNOSIS — G544 Lumbosacral root disorders, not elsewhere classified: Secondary | ICD-10-CM | POA: Diagnosis not present

## 2014-10-12 DIAGNOSIS — H00016 Hordeolum externum left eye, unspecified eyelid: Secondary | ICD-10-CM | POA: Diagnosis not present

## 2014-10-12 DIAGNOSIS — H109 Unspecified conjunctivitis: Secondary | ICD-10-CM | POA: Diagnosis not present

## 2014-12-04 DIAGNOSIS — M47816 Spondylosis without myelopathy or radiculopathy, lumbar region: Secondary | ICD-10-CM | POA: Diagnosis not present

## 2014-12-04 DIAGNOSIS — M545 Low back pain: Secondary | ICD-10-CM | POA: Diagnosis not present

## 2014-12-23 DIAGNOSIS — R110 Nausea: Secondary | ICD-10-CM | POA: Diagnosis not present

## 2014-12-24 DIAGNOSIS — R1110 Vomiting, unspecified: Secondary | ICD-10-CM | POA: Diagnosis not present

## 2014-12-24 DIAGNOSIS — R42 Dizziness and giddiness: Secondary | ICD-10-CM | POA: Diagnosis not present

## 2014-12-24 DIAGNOSIS — R1084 Generalized abdominal pain: Secondary | ICD-10-CM | POA: Diagnosis not present

## 2014-12-25 ENCOUNTER — Inpatient Hospital Stay (HOSPITAL_COMMUNITY)
Admission: EM | Admit: 2014-12-25 | Discharge: 2014-12-27 | DRG: 394 | Disposition: A | Discharge status: Home / Self Care | Payer: Medicare Other | Attending: Family Medicine | Admitting: Family Medicine

## 2014-12-25 ENCOUNTER — Emergency Department (HOSPITAL_COMMUNITY): Payer: Medicare Other

## 2014-12-25 ENCOUNTER — Encounter (HOSPITAL_COMMUNITY): Payer: Self-pay | Admitting: *Deleted

## 2014-12-25 DIAGNOSIS — E869 Volume depletion, unspecified: Secondary | ICD-10-CM | POA: Diagnosis not present

## 2014-12-25 DIAGNOSIS — K56609 Unspecified intestinal obstruction, unspecified as to partial versus complete obstruction: Secondary | ICD-10-CM

## 2014-12-25 DIAGNOSIS — R112 Nausea with vomiting, unspecified: Secondary | ICD-10-CM | POA: Diagnosis not present

## 2014-12-25 DIAGNOSIS — E860 Dehydration: Secondary | ICD-10-CM | POA: Diagnosis not present

## 2014-12-25 DIAGNOSIS — K529 Noninfective gastroenteritis and colitis, unspecified: Secondary | ICD-10-CM | POA: Diagnosis not present

## 2014-12-25 DIAGNOSIS — Z79891 Long term (current) use of opiate analgesic: Secondary | ICD-10-CM

## 2014-12-25 DIAGNOSIS — K567 Ileus, unspecified: Secondary | ICD-10-CM

## 2014-12-25 DIAGNOSIS — N179 Acute kidney failure, unspecified: Secondary | ICD-10-CM | POA: Diagnosis not present

## 2014-12-25 DIAGNOSIS — E871 Hypo-osmolality and hyponatremia: Secondary | ICD-10-CM | POA: Diagnosis present

## 2014-12-25 DIAGNOSIS — M549 Dorsalgia, unspecified: Secondary | ICD-10-CM | POA: Diagnosis present

## 2014-12-25 DIAGNOSIS — R1030 Lower abdominal pain, unspecified: Secondary | ICD-10-CM | POA: Diagnosis not present

## 2014-12-25 DIAGNOSIS — K5660 Unspecified intestinal obstruction: Secondary | ICD-10-CM | POA: Diagnosis present

## 2014-12-25 DIAGNOSIS — Z87891 Personal history of nicotine dependence: Secondary | ICD-10-CM

## 2014-12-25 DIAGNOSIS — I951 Orthostatic hypotension: Secondary | ICD-10-CM | POA: Diagnosis not present

## 2014-12-25 DIAGNOSIS — R55 Syncope and collapse: Secondary | ICD-10-CM | POA: Diagnosis present

## 2014-12-25 DIAGNOSIS — K219 Gastro-esophageal reflux disease without esophagitis: Secondary | ICD-10-CM | POA: Diagnosis present

## 2014-12-25 DIAGNOSIS — I10 Essential (primary) hypertension: Secondary | ICD-10-CM | POA: Diagnosis present

## 2014-12-25 DIAGNOSIS — N400 Enlarged prostate without lower urinary tract symptoms: Secondary | ICD-10-CM | POA: Diagnosis present

## 2014-12-25 DIAGNOSIS — Z7982 Long term (current) use of aspirin: Secondary | ICD-10-CM

## 2014-12-25 DIAGNOSIS — K521 Toxic gastroenteritis and colitis: Secondary | ICD-10-CM | POA: Diagnosis not present

## 2014-12-25 DIAGNOSIS — R32 Unspecified urinary incontinence: Secondary | ICD-10-CM | POA: Diagnosis present

## 2014-12-25 DIAGNOSIS — K8020 Calculus of gallbladder without cholecystitis without obstruction: Secondary | ICD-10-CM | POA: Diagnosis not present

## 2014-12-25 DIAGNOSIS — M1990 Unspecified osteoarthritis, unspecified site: Secondary | ICD-10-CM | POA: Diagnosis present

## 2014-12-25 DIAGNOSIS — K5732 Diverticulitis of large intestine without perforation or abscess without bleeding: Secondary | ICD-10-CM | POA: Diagnosis not present

## 2014-12-25 DIAGNOSIS — R109 Unspecified abdominal pain: Secondary | ICD-10-CM | POA: Diagnosis not present

## 2014-12-25 DIAGNOSIS — Z79899 Other long term (current) drug therapy: Secondary | ICD-10-CM | POA: Diagnosis not present

## 2014-12-25 DIAGNOSIS — G8929 Other chronic pain: Secondary | ICD-10-CM | POA: Diagnosis present

## 2014-12-25 DIAGNOSIS — I959 Hypotension, unspecified: Secondary | ICD-10-CM | POA: Diagnosis present

## 2014-12-25 LAB — URINALYSIS, ROUTINE W REFLEX MICROSCOPIC
Glucose, UA: NEGATIVE mg/dL
KETONES UR: NEGATIVE mg/dL
LEUKOCYTES UA: NEGATIVE
Nitrite: NEGATIVE
PROTEIN: 100 mg/dL — AB
Specific Gravity, Urine: 1.025 (ref 1.005–1.030)
Urobilinogen, UA: 0.2 mg/dL (ref 0.0–1.0)
pH: 5 (ref 5.0–8.0)

## 2014-12-25 LAB — COMPREHENSIVE METABOLIC PANEL
ALK PHOS: 61 U/L (ref 38–126)
ALT: 26 U/L (ref 17–63)
AST: 25 U/L (ref 15–41)
Albumin: 4.2 g/dL (ref 3.5–5.0)
Anion gap: 22 — ABNORMAL HIGH (ref 5–15)
BUN: 81 mg/dL — ABNORMAL HIGH (ref 6–20)
CALCIUM: 9.2 mg/dL (ref 8.9–10.3)
CHLORIDE: 86 mmol/L — AB (ref 101–111)
CO2: 21 mmol/L — ABNORMAL LOW (ref 22–32)
CREATININE: 5.41 mg/dL — AB (ref 0.61–1.24)
GFR calc Af Amer: 11 mL/min — ABNORMAL LOW (ref >60)
GFR calc non Af Amer: 10 mL/min — ABNORMAL LOW (ref >60)
Glucose, Bld: 132 mg/dL — ABNORMAL HIGH (ref 65–99)
Potassium: 4.3 mmol/L (ref 3.5–5.1)
Sodium: 129 mmol/L — ABNORMAL LOW (ref 135–145)
Total Bilirubin: 0.5 mg/dL (ref 0.3–1.2)
Total Protein: 8.2 g/dL — ABNORMAL HIGH (ref 6.5–8.1)

## 2014-12-25 LAB — CBC WITH DIFFERENTIAL/PLATELET
Basophils Absolute: 0 10*3/uL (ref 0.0–0.1)
Basophils Relative: 0 % (ref 0–1)
EOS ABS: 0 10*3/uL (ref 0.0–0.7)
EOS PCT: 0 % (ref 0–5)
HCT: 39.9 % (ref 39.0–52.0)
Hemoglobin: 13.8 g/dL (ref 13.0–17.0)
LYMPHS ABS: 1.9 10*3/uL (ref 0.7–4.0)
Lymphocytes Relative: 13 % (ref 12–46)
MCH: 31.4 pg (ref 26.0–34.0)
MCHC: 34.6 g/dL (ref 30.0–36.0)
MCV: 90.7 fL (ref 78.0–100.0)
Monocytes Absolute: 1.6 10*3/uL — ABNORMAL HIGH (ref 0.1–1.0)
Monocytes Relative: 11 % (ref 3–12)
Neutro Abs: 11.1 10*3/uL — ABNORMAL HIGH (ref 1.7–7.7)
Neutrophils Relative %: 76 % (ref 43–77)
PLATELETS: 305 10*3/uL (ref 150–400)
RBC: 4.4 MIL/uL (ref 4.22–5.81)
RDW: 14.2 % (ref 11.5–15.5)
WBC: 14.7 10*3/uL — AB (ref 4.0–10.5)

## 2014-12-25 LAB — CK: Total CK: 267 U/L (ref 49–397)

## 2014-12-25 LAB — URINE MICROSCOPIC-ADD ON

## 2014-12-25 LAB — MAGNESIUM: Magnesium: 2 mg/dL (ref 1.7–2.4)

## 2014-12-25 LAB — CREATININE, URINE, RANDOM: CREATININE, URINE: 151.32 mg/dL

## 2014-12-25 LAB — NA AND K (SODIUM & POTASSIUM), RAND UR
POTASSIUM UR: 77 mmol/L
SODIUM UR: 11 mmol/L

## 2014-12-25 LAB — LIPASE, BLOOD: Lipase: 20 U/L — ABNORMAL LOW (ref 22–51)

## 2014-12-25 LAB — PHOSPHORUS: PHOSPHORUS: 7.5 mg/dL — AB (ref 2.5–4.6)

## 2014-12-25 LAB — TROPONIN I: Troponin I: 0.03 ng/mL (ref <0.031)

## 2014-12-25 LAB — CBG MONITORING, ED: Glucose-Capillary: 147 mg/dL — ABNORMAL HIGH (ref 65–99)

## 2014-12-25 MED ORDER — CYCLOBENZAPRINE HCL 10 MG PO TABS
10.0000 mg | ORAL_TABLET | Freq: Once | ORAL | Status: AC
  Administered 2014-12-25: 10 mg via ORAL
  Filled 2014-12-25: qty 1

## 2014-12-25 MED ORDER — ACETAMINOPHEN 650 MG RE SUPP: 650.0000 mg | Freq: Four times a day (QID) | RECTAL | Status: DC | PRN

## 2014-12-25 MED ORDER — HEPARIN SODIUM (PORCINE) 5000 UNIT/ML IJ SOLN
5000.0000 [IU] | Freq: Three times a day (TID) | INTRAMUSCULAR | Status: DC
  Administered 2014-12-25 – 2014-12-27 (×5): 5000 [IU] via SUBCUTANEOUS
  Filled 2014-12-25 (×5): qty 1

## 2014-12-25 MED ORDER — SODIUM CHLORIDE 0.9 % IV SOLN
INTRAVENOUS | Status: DC
  Administered 2014-12-25 – 2014-12-27 (×5): via INTRAVENOUS

## 2014-12-25 MED ORDER — SODIUM CHLORIDE 0.9 % IV SOLN
INTRAVENOUS | Status: DC
  Administered 2014-12-25: 16:00:00 via INTRAVENOUS

## 2014-12-25 MED ORDER — SODIUM CHLORIDE 0.9 % IV SOLN
1000.0000 mL | Freq: Once | INTRAVENOUS | Status: AC
  Administered 2014-12-25: 1000 mL via INTRAVENOUS

## 2014-12-25 MED ORDER — SODIUM CHLORIDE 0.9 % IV BOLUS (SEPSIS)
1000.0000 mL | Freq: Once | INTRAVENOUS | Status: AC
  Administered 2014-12-25: 1000 mL via INTRAVENOUS

## 2014-12-25 MED ORDER — HYDROCODONE-ACETAMINOPHEN 5-325 MG PO TABS
2.0000 | ORAL_TABLET | Freq: Once | ORAL | Status: AC
  Administered 2014-12-25: 2 via ORAL
  Filled 2014-12-25: qty 2

## 2014-12-25 MED ORDER — SODIUM CHLORIDE 0.9 % IV BOLUS (SEPSIS)
2000.0000 mL | Freq: Once | INTRAVENOUS | Status: AC
  Administered 2014-12-25: 2000 mL via INTRAVENOUS

## 2014-12-25 MED ORDER — ONDANSETRON HCL 4 MG PO TABS: 4.0000 mg | ORAL_TABLET | Freq: Four times a day (QID) | ORAL | Status: DC | PRN

## 2014-12-25 MED ORDER — HYDROCODONE-ACETAMINOPHEN 5-325 MG PO TABS
1.0000 | ORAL_TABLET | Freq: Four times a day (QID) | ORAL | Status: DC | PRN
  Administered 2014-12-26: 2 via ORAL
  Filled 2014-12-25: qty 2

## 2014-12-25 MED ORDER — ONDANSETRON HCL 4 MG/2ML IJ SOLN
4.0000 mg | Freq: Four times a day (QID) | INTRAMUSCULAR | Status: DC | PRN
  Administered 2014-12-25: 4 mg via INTRAVENOUS
  Filled 2014-12-25: qty 2

## 2014-12-25 MED ORDER — ACETAMINOPHEN 325 MG PO TABS
650.0000 mg | ORAL_TABLET | Freq: Four times a day (QID) | ORAL | Status: DC | PRN
  Administered 2014-12-25 – 2014-12-26 (×2): 650 mg via ORAL
  Filled 2014-12-25 (×2): qty 2

## 2014-12-25 NOTE — ED Notes (Signed)
Admitting physician at bedside

## 2014-12-25 NOTE — Progress Notes (Signed)
Utilization Review completed.  Ameilia Rattan RN CM  

## 2014-12-25 NOTE — Consult Note (Signed)
Renal Service Consult Note [facility]  Johnny Peck 12/25/2014 [place] D Requesting Physician:  Dr Darrick Meigs  Reason for Consult:  Acute renal failure HPI: The patient is a 69 y.o. year-old with hx of HTN, BPH, HL and GERD. Takes acei/hctz combination, PPI, asa, Mobic, MTP, Crestor, Cialis and flomax.  He was in [place] this weekend at the [employer].  He ate some lobster Sunday am 3 days ago while there, then flew back to [facility].  That same day developed lower mid abd pain which persisted. On Monday began to vomit. Saw PCP and was given nausea Rx.  Kept on with n/v and abd pain.  Last night got IV nausea med on second trip to PCP's and was told to hold his lisinopril.  Today starting vomiting this am again and came to ED. Creat is 5.0. In April creat was 1.16.  BP was in the 90's.  Getting IVF"s, abd still sore but feeling better after IVF's.  CT abd no contrast shows possible PSBO, normal kidneys w no hydro.  UA pending.   No hx renal failure, +nsaid's, +ACEi.  No diarrhea, no fevers , chills , sweats, no brown or tea-colored urine.  Past Medical History  Past Medical History  Diagnosis Date  . Hypertension   . Urinary incontinence   . GERD (gastroesophageal reflux disease)   . Arthritis    Past Surgical History  Past Surgical History  Procedure Laterality Date  . Tonsillectomy    . Inguinal hernia repair Left 08/09/2014    Procedure: LAPAROSCOPIC REPAIR OF LEFT INGUINAL HERNIA WITH MESH;  Surgeon: Greer Pickerel, MD;  Location: [facility];  Service: General;  Laterality: Left;  . Hernia repair  approx 1990    right inguinal hernia repair   . Hernia repair Left 2016   Family History History reviewed. No pertinent family history. Social History  reports that he quit smoking about 31 years ago. He has never used smokeless tobacco. He reports that he drinks alcohol. He reports that he does not use illicit drugs. Allergies No Known Allergies Home medications Prior to  Admission medications   Medication Sig Start Date End Date Taking? Authorizing Provider  acetaminophen (TYLENOL) 500 MG tablet Take 500-1,000 mg by mouth every 6 (six) hours as needed for moderate pain or headache.   Yes Historical Provider, MD  aspirin EC 325 MG tablet Take 325 mg by mouth every evening.   Yes Historical Provider, MD  Cholecalciferol (VITAMIN D PO) Take 2,400 Units by mouth every morning.   Yes Historical Provider, MD  desonide (DESOWEN) 0.05 % cream Apply 1 application topically daily as needed (rash).   Yes Historical Provider, MD  lisinopril-hydrochlorothiazide (PRINZIDE,ZESTORETIC) 20-12.5 MG per tablet Take 1 tablet by mouth every morning.   Yes Historical Provider, MD  meloxicam (MOBIC) 15 MG tablet Take 15 mg by mouth every evening.   Yes Historical Provider, MD  metoprolol succinate (TOPROL-XL) 50 MG 24 hr tablet Take 50 mg by mouth every morning. Take with or immediately following a meal.   Yes Historical Provider, MD  Multiple Vitamin (MULTIVITAMIN WITH MINERALS) TABS tablet Take 1 tablet by mouth 2 (two) times daily. Morning and Noon.   Yes Historical Provider, MD  Nutritional Supplements (SOY PROTEIN SHAKE PO) Take 2 scoop by mouth daily.    Yes Historical Provider, MD  Nutritional Supplements (WELLNESS ESSENTIALS FOR JOINT PO) Take 1 tablet by mouth every morning.   Yes Historical Provider, MD  omeprazole (PRILOSEC) 20 MG  capsule Take 20 mg by mouth every morning.   Yes Historical Provider, MD  ondansetron (ZOFRAN) 4 MG tablet Take 4 mg by mouth every 8 (eight) hours as needed for nausea or vomiting.   Yes Historical Provider, MD  OVER THE COUNTER MEDICATION Take 1 tablet by mouth 2 (two) times daily. Co-Q 10-- 120 mg + Red Yeast Rice 1200 mg.   Yes Historical Provider, MD  OVER THE COUNTER MEDICATION Take 1 tablet by mouth every morning. Garlic Cholesterol Health.   Yes Historical Provider, MD  OVER THE COUNTER MEDICATION Take 1 tablet by mouth every morning. Prostate  health.   Yes Historical Provider, MD  psyllium (METAMUCIL) 58.6 % packet Take 1 packet by mouth daily.   Yes Historical Provider, MD  rosuvastatin (CRESTOR) 40 MG tablet Take 40 mg by mouth every evening.   Yes Historical Provider, MD  tadalafil (CIALIS) 5 MG tablet Take 5 mg by mouth daily as needed for erectile dysfunction.    Yes Historical Provider, MD  tamsulosin (FLOMAX) 0.4 MG CAPS capsule Take 0.4 mg by mouth every evening.   Yes Historical Provider, MD  traMADol (ULTRAM) 50 MG tablet Take 50 mg by mouth every evening.   Yes Historical Provider, MD  oxyCODONE-acetaminophen (ROXICET) 5-325 MG per tablet Take 1-2 tablets by mouth every 4 (four) hours as needed for severe pain. Patient not taking: Reported on 12/25/2014 08/09/14   Greer Pickerel, MD   Liver Function Tests  Recent Labs Lab 12/25/14 1120  AST 25  ALT 26  ALKPHOS 61  BILITOT 0.5  PROT 8.2*  ALBUMIN 4.2    Recent Labs Lab 12/25/14 1120  LIPASE 20*   CBC  Recent Labs Lab 12/25/14 1120  WBC 14.7*  NEUTROABS 11.1*  HGB 13.8  HCT 39.9  MCV 90.7  PLT 324   Basic Metabolic Panel  Recent Labs Lab 12/25/14 1120  NA 129*  K 4.3  CL 86*  CO2 21*  GLUCOSE 132*  BUN 81*  CREATININE 5.41*  CALCIUM 9.2  PHOS 7.5*    Filed Vitals:   12/25/14 1430 12/25/14 1515 12/25/14 1523 12/25/14 1600  BP: 100/62 104/59 104/59 101/57  Pulse: 87 85 86 87  Temp:      TempSrc:      Resp: 14 21 23 21   Weight:      SpO2: 95% 96% 94% 92%   Exam Alert, no distress, calm No rash, cyanosis or gangrene Sclera anicteric, throat clear NO jvd Chest clear bilat no wheezes or rales RRR no MRG Abd soft ntnd no mass or ascites GU normal male No LE or UE edema No joint effusion / deformity Neuro is nf, ox 3,, no asterixis  UA - pending Na 129  K 4.3  CO2 21 CL 86  BUN 81  Creat 5.41  Ca 9.2   P 7.5  Alb 4.2 wBC 14k  Hb 13 Plt 305 CT abd >  normal kidneys, possible PSBO  Assessment: 1 Acute renal failure - in pt with  acute GI illness w frequent vomiting, vol depletion and hypotension on ACEi and NSAID's. Prob functional AKI.  Plan is to get UA, give lots of IVF's and hold BP meds/ nsaid's.  Should recover relatively quickly, no hx renal problems.  2 Abd pain / n/v - possible PSBO per CT  3 HTN 4 HL 5 BPH    Plan- IVF"s, UA, hold BP meds, avoid nsaidsKelly Splinter MD (pgr) [phone]    [phone]  12/25/2014, 4:39 PM

## 2014-12-25 NOTE — Consult Note (Signed)
Reason for Consult:  SBO with [facility] Referring Physician: M Pheiffer  Johnny Peck is an 69 y.o. male.  HPI: Pt was vacationing in [place] and had some raw oysters 12/21/14, and then a Lobster roll 12/22/14.Marland Kitchen  He then developed nausea and vomiting and flew home on 12/22/14.  He has been having nausea and vomiting 4-5 times per day since Sunday, he has been trying to drink water, no diarrhea and his last BM was yesterday 12/24/14. He passed out at home yesterday 12/24/14 and was seen by PCP, given antiemetics without improvement. He has not been taking BP meds because his BP was low at home.  He reports leg cramping.  Abdominal pain is better than Sunday 9/4, but he still has nausea and vomiting.  He returned to Urgent care last PM when antiemetics did not work,  He has not been able to keep even clear liquids down, and was sent to Ed.   Work up in the ED shows:  Hypotension, BP down to 87/53, Na 129, with creatinine of 5.41, BUN of 86.  WBC 14.7.  Lactate is pending. CT scan:  There are multiple dilated proximal and mid small bowel loops with relative sparing of ileal small bowel loops and transition in probably an ileal loop in the right lower quadrant. The colon is normal. The appendix is normal. We are ask to see.    Past Medical History  Diagnosis Date  . Hypertension   . Urinary incontinence   . GERD (gastroesophageal reflux disease)   . Arthritis     Past Surgical History  Procedure Laterality Date  . Tonsillectomy    . Hernia repair      right inguinal hernia repair   . Inguinal hernia repair Left 08/09/2014    Procedure: LAPAROSCOPIC REPAIR OF LEFT INGUINAL HERNIA WITH MESH;  Surgeon: Greer Pickerel, MD;  Location: [facility];  Service: General;  Laterality: Left;    History reviewed. No pertinent family history.  Social History:  reports that he has quit smoking. He has never used smokeless tobacco. He reports that he drinks alcohol. He reports that he does not use illicit drugs.  Allergies: No  Known Allergies  Prior to Admission medications   Medication Sig Start Date End Date Taking? Authorizing Provider  acetaminophen (TYLENOL) 500 MG tablet Take 500-1,000 mg by mouth every 6 (six) hours as needed for moderate pain or headache.   Yes Historical Provider, MD  aspirin EC 325 MG tablet Take 325 mg by mouth every evening.   Yes Historical Provider, MD  Cholecalciferol (VITAMIN D PO) Take 2,400 Units by mouth every morning.   Yes Historical Provider, MD  desonide (DESOWEN) 0.05 % cream Apply 1 application topically daily as needed (rash).   Yes Historical Provider, MD  lisinopril-hydrochlorothiazide (PRINZIDE,ZESTORETIC) 20-12.5 MG per tablet Take 1 tablet by mouth every morning.   Yes Historical Provider, MD  meloxicam (MOBIC) 15 MG tablet Take 15 mg by mouth every evening.   Yes Historical Provider, MD  metoprolol succinate (TOPROL-XL) 50 MG 24 hr tablet Take 50 mg by mouth every morning. Take with or immediately following a meal.   Yes Historical Provider, MD  Multiple Vitamin (MULTIVITAMIN WITH MINERALS) TABS tablet Take 1 tablet by mouth 2 (two) times daily. Morning and Noon.   Yes Historical Provider, MD  Nutritional Supplements (SOY PROTEIN SHAKE PO) Take 2 scoop by mouth daily.    Yes Historical Provider, MD  Nutritional Supplements (WELLNESS ESSENTIALS FOR JOINT PO) Take 1  tablet by mouth every morning.   Yes Historical Provider, MD  omeprazole (PRILOSEC) 20 MG capsule Take 20 mg by mouth every morning.   Yes Historical Provider, MD  ondansetron (ZOFRAN) 4 MG tablet Take 4 mg by mouth every 8 (eight) hours as needed for nausea or vomiting.   Yes Historical Provider, MD  OVER THE COUNTER MEDICATION Take 1 tablet by mouth 2 (two) times daily. Co-Q 10-- 120 mg + Red Yeast Rice 1200 mg.   Yes Historical Provider, MD  OVER THE COUNTER MEDICATION Take 1 tablet by mouth every morning. Garlic Cholesterol Health.   Yes Historical Provider, MD  OVER THE COUNTER MEDICATION Take 1 tablet by  mouth every morning. Prostate health.   Yes Historical Provider, MD  psyllium (METAMUCIL) 58.6 % packet Take 1 packet by mouth daily.   Yes Historical Provider, MD  rosuvastatin (CRESTOR) 40 MG tablet Take 40 mg by mouth every evening.   Yes Historical Provider, MD  tadalafil (CIALIS) 5 MG tablet Take 5 mg by mouth daily as needed for erectile dysfunction.    Yes Historical Provider, MD  tamsulosin (FLOMAX) 0.4 MG CAPS capsule Take 0.4 mg by mouth every evening.   Yes Historical Provider, MD  traMADol (ULTRAM) 50 MG tablet Take 50 mg by mouth every evening.   Yes Historical Provider, MD  oxyCODONE-acetaminophen (ROXICET) 5-325 MG per tablet Take 1-2 tablets by mouth every 4 (four) hours as needed for severe pain. Patient not taking: Reported on 12/25/2014 08/09/14   Greer Pickerel, MD     Results for orders placed or performed during the hospital encounter of 12/25/14 (from the past 48 hour(s))  CBG monitoring, ED     Status: Abnormal   Collection Time: 12/25/14 11:16 AM  Result Value Ref Range   Glucose-Capillary 147 (H) 65 - 99 mg/dL  Comprehensive metabolic panel     Status: Abnormal   Collection Time: 12/25/14 11:20 AM  Result Value Ref Range   Sodium 129 (L) 135 - 145 mmol/L    Comment: REPEATED TO VERIFY   Potassium 4.3 3.5 - 5.1 mmol/L    Comment: REPEATED TO VERIFY   Chloride 86 (L) 101 - 111 mmol/L    Comment: REPEATED TO VERIFY   CO2 21 (L) 22 - 32 mmol/L    Comment: REPEATED TO VERIFY   Glucose, Bld 132 (H) 65 - 99 mg/dL   BUN 81 (H) 6 - 20 mg/dL   Creatinine, Ser 5.41 (H) 0.61 - 1.24 mg/dL   Calcium 9.2 8.9 - 10.3 mg/dL    Comment: REPEATED TO VERIFY   Total Protein 8.2 (H) 6.5 - 8.1 g/dL   Albumin 4.2 3.5 - 5.0 g/dL   AST 25 15 - 41 U/L   ALT 26 17 - 63 U/L   Alkaline Phosphatase 61 38 - 126 U/L   Total Bilirubin 0.5 0.3 - 1.2 mg/dL   GFR calc non Af Amer 10 (L) >60 mL/min   GFR calc Af Amer 11 (L) >60 mL/min    Comment: (NOTE) The eGFR has been calculated using the  CKD EPI equation. This calculation has not been validated in all clinical situations. eGFR's persistently <60 mL/min signify possible Chronic Kidney Disease.    Anion gap 22 (H) 5 - 15    Comment: REPEATED TO VERIFY  CBC with Differential     Status: Abnormal   Collection Time: 12/25/14 11:20 AM  Result Value Ref Range   WBC 14.7 (H) 4.0 - 10.5 K/uL  RBC 4.40 4.22 - 5.81 MIL/uL   Hemoglobin 13.8 13.0 - 17.0 g/dL   HCT 39.9 39.0 - 52.0 %   MCV 90.7 78.0 - 100.0 fL   MCH 31.4 26.0 - 34.0 pg   MCHC 34.6 30.0 - 36.0 g/dL   RDW 14.2 11.5 - 15.5 %   Platelets 305 150 - 400 K/uL   Neutrophils Relative % 76 43 - 77 %   Neutro Abs 11.1 (H) 1.7 - 7.7 K/uL   Lymphocytes Relative 13 12 - 46 %   Lymphs Abs 1.9 0.7 - 4.0 K/uL   Monocytes Relative 11 3 - 12 %   Monocytes Absolute 1.6 (H) 0.1 - 1.0 K/uL   Eosinophils Relative 0 0 - 5 %   Eosinophils Absolute 0.0 0.0 - 0.7 K/uL   Basophils Relative 0 0 - 1 %   Basophils Absolute 0.0 0.0 - 0.1 K/uL  Lipase, blood     Status: Abnormal   Collection Time: 12/25/14 11:20 AM  Result Value Ref Range   Lipase 20 (L) 22 - 51 U/L  Magnesium     Status: None   Collection Time: 12/25/14 11:20 AM  Result Value Ref Range   Magnesium 2.0 1.7 - 2.4 mg/dL  Phosphorus     Status: Abnormal   Collection Time: 12/25/14 11:20 AM  Result Value Ref Range   Phosphorus 7.5 (H) 2.5 - 4.6 mg/dL  CK     Status: None   Collection Time: 12/25/14 11:20 AM  Result Value Ref Range   Total CK 267 49 - 397 U/L  Troponin I     Status: None   Collection Time: 12/25/14 11:20 AM  Result Value Ref Range   Troponin I 0.03 <0.031 ng/mL    Comment:        NO INDICATION OF MYOCARDIAL INJURY.     Ct Abdomen Pelvis Wo Contrast  12/25/2014   CLINICAL DATA:  Low abdomen pain since Sunday.  EXAM: CT ABDOMEN AND PELVIS WITHOUT CONTRAST  TECHNIQUE: Multidetector CT imaging of the abdomen and pelvis was performed following the standard protocol without IV contrast.  COMPARISON:   None.  FINDINGS: There are multiple dilated proximal and mid small bowel loops with relative sparing of ileal small bowel loops and transition in probably an ileal loop in the right lower quadrant. The colon is normal. The appendix is normal.  The liver, spleen, pancreas, gallbladder, adrenal glands are normal. There is no hydronephrosis bilaterally. No nephrolithiasis is identified bilaterally. There is a midpole right kidney cyst measuring 2.1 cm. There is atherosclerosis of the aorta without aneurysmal dilatation. There is no abdominal lymphadenopathy.  Fluid-filled bladder is normal. Pelvic phleboliths are identified. Prostate gland is normal.  Minimal dependent atelectasis of the posterior lung bases are identified. Degenerative joint changes of the spine are noted.  IMPRESSION: Multiple dilated small bowel loops with relative sparing of ileal small bowel loops in transition in probably an ileal loop in the right lower quadrant. Differential diagnosis includes early or partial small bowel obstruction. Followup is recommended.   Electronically Signed   By: Abelardo Diesel M.D.   On: 12/25/2014 13:34    Review of Systems  Constitutional: Negative.   HENT: Negative.   Eyes: Negative.   Respiratory: Negative.   Cardiovascular: Negative.   Gastrointestinal: Positive for heartburn (on chronic PPI), nausea (last nausea and vomiting 0530 and 0930 this AM,  small BM after Nausea and vomiting this AM.), vomiting and abdominal pain. Negative for diarrhea, constipation,  blood in stool and melena.  Genitourinary: Negative.        Decreased volume  Musculoskeletal: Positive for back pain (chronic back pain on tramadol and NSAID).  Skin: Negative.   Neurological: Negative.   Endo/Heme/Allergies: Negative.   Psychiatric/Behavioral: Negative.    Blood pressure 101/61, pulse 88, temperature 97.8 F (36.6 C), temperature source Oral, resp. rate 23, weight 79.379 kg (175 lb), SpO2 94 %. Physical Exam   Constitutional: He is oriented to person, place, and time. He appears well-developed. No distress.  Dehydrated WM, no acute distress.  HENT:  Head: Normocephalic and atraumatic.  Nose: Nose normal.  Eyes: Conjunctivae and EOM are normal. Right eye exhibits no discharge. Left eye exhibits no discharge. No scleral icterus.  Neck: Neck supple. No JVD present. No tracheal deviation present. No thyromegaly present.  Cardiovascular: Normal rate, regular rhythm, normal heart sounds and intact distal pulses.   No murmur heard. Respiratory: Effort normal and breath sounds normal. No respiratory distress. He has no wheezes. He has no rales. He exhibits no tenderness.  GI: Soft. Bowel sounds are normal. He exhibits no distension and no mass. There is tenderness (minimal tenderness over umbilical region now.). There is no rebound and no guarding.  Musculoskeletal: He exhibits no edema.  Lymphadenopathy:    He has no cervical adenopathy.  Neurological: He is alert and oriented to person, place, and time. No cranial nerve deficit.  Skin: Skin is warm and dry. No rash noted. He is not diaphoretic. No erythema. No pallor.  Dehydrated on exam  Psychiatric: He has a normal mood and affect. His behavior is normal. Judgment and thought content normal.    Assessment/Plan: PSBO  Severe dehydration with Acute renal failure Hx of Hypertension Chronic back pain GERD  Plan:  He vomited last early this AM 0530 and 0930, since then he has had a small BM and is passing flatus.  Abdomen is currently soft, with a few high pitched bowel sounds.  I think he is getting better, but he is also in acute renal failure.  I would keep him NPO except for ice chips, recheck film in AM and treat renal failure.  We will follow with you.  Birgit Nowling 12/25/2014, 2:10 PM

## 2014-12-25 NOTE — ED Notes (Addendum)
Pt reports he was out of town at [place] over the last weekend. Pt ate raw oysters Saturday night, had lobster roll on Sunday and had immediate abd pain. Pt flew back home Sunday. Still did not fell well Monday, went to urgent care/pcp and was given nausea meds. No relief. Passed out on Tuesday then went to pcp, given different nausea meds, was told his BP was very low and to hold BP meds until he was feeling better. Vomited x2 today. Last bowel movement today- denies diarrhea.  abd pain 4/10 at present.  Reports leg cramps x3 days.    Reports ears feel "plump" since trip is having difficulty hearing.

## 2014-12-25 NOTE — ED Provider Notes (Signed)
CSN: [id number]     Arrival date & time 12/25/14  1014 History   First MD Initiated Contact with Patient 12/25/14 1124     Chief Complaint  Patient presents with  . Abdominal Pain  . Vomiting  . Otalgia     (Consider location/radiation/quality/duration/timing/severity/associated sxs/prior Treatment) HPI Patient reports he was in [place] over the weekend. Saturday night he ate raw oysters. On Sunday he ate a lobster roll and after a very short period of time, within 10-15 minutes he developed central cramping abdominal pain and subsequently vomiting. He flew back home on Sunday. Patient continued to have vomiting up to 4-5 times per day. He has been trying to take in water. He denies diarrhea. He reports that he did have a normal bowel movement today. Patient reports on Tuesday he had loss of consciousness. He was seen by his PCP and started on antiemetics but has not been getting any improvement. He has not been taking his blood pressure medications due to low blood pressure identified at his PCPs office. He states he's been getting cramping in his legs. He reports that his central lower abdominal pain was very bad on Sunday. He reports it still present but not quite as severe as it was on Sunday. He has not had any rash, paresthesias, fever. Past Medical History  Diagnosis Date  . Hypertension   . Urinary incontinence   . GERD (gastroesophageal reflux disease)   . Arthritis    Past Surgical History  Procedure Laterality Date  . Tonsillectomy    . Inguinal hernia repair Left 08/09/2014    Procedure: LAPAROSCOPIC REPAIR OF LEFT INGUINAL HERNIA WITH MESH;  Surgeon: Greer Pickerel, MD;  Location: [facility];  Service: General;  Laterality: Left;  . Hernia repair  approx 1990    right inguinal hernia repair   . Hernia repair Left 2016   History reviewed. No pertinent family history. Social History  Substance Use Topics  . Smoking status: Former Smoker    Quit date: 12/25/1983  . Smokeless  tobacco: Never Used  . Alcohol Use: Yes     Comment: occasional     Review of Systems  10 Systems reviewed and are negative for acute change except as noted in the HPI.   Allergies  Review of patient's allergies indicates no known allergies.  Home Medications   Prior to Admission medications   Medication Sig Start Date End Date Taking? Authorizing Provider  acetaminophen (TYLENOL) 500 MG tablet Take 500-1,000 mg by mouth every 6 (six) hours as needed for moderate pain or headache.   Yes Historical Provider, MD  aspirin EC 325 MG tablet Take 325 mg by mouth every evening.   Yes Historical Provider, MD  Cholecalciferol (VITAMIN D PO) Take 2,400 Units by mouth every morning.   Yes Historical Provider, MD  desonide (DESOWEN) 0.05 % cream Apply 1 application topically daily as needed (rash).   Yes Historical Provider, MD  lisinopril-hydrochlorothiazide (PRINZIDE,ZESTORETIC) 20-12.5 MG per tablet Take 1 tablet by mouth every morning.   Yes Historical Provider, MD  meloxicam (MOBIC) 15 MG tablet Take 15 mg by mouth every evening.   Yes Historical Provider, MD  metoprolol succinate (TOPROL-XL) 50 MG 24 hr tablet Take 50 mg by mouth every morning. Take with or immediately following a meal.   Yes Historical Provider, MD  Multiple Vitamin (MULTIVITAMIN WITH MINERALS) TABS tablet Take 1 tablet by mouth 2 (two) times daily. Morning and Noon.   Yes Historical Provider, MD  Nutritional  Supplements (SOY PROTEIN SHAKE PO) Take 2 scoop by mouth daily.    Yes Historical Provider, MD  Nutritional Supplements (WELLNESS ESSENTIALS FOR JOINT PO) Take 1 tablet by mouth every morning.   Yes Historical Provider, MD  omeprazole (PRILOSEC) 20 MG capsule Take 20 mg by mouth every morning.   Yes Historical Provider, MD  ondansetron (ZOFRAN) 4 MG tablet Take 4 mg by mouth every 8 (eight) hours as needed for nausea or vomiting.   Yes Historical Provider, MD  OVER THE COUNTER MEDICATION Take 1 tablet by mouth 2 (two)  times daily. Co-Q 10-- 120 mg + Red Yeast Rice 1200 mg.   Yes Historical Provider, MD  OVER THE COUNTER MEDICATION Take 1 tablet by mouth every morning. Garlic Cholesterol Health.   Yes Historical Provider, MD  OVER THE COUNTER MEDICATION Take 1 tablet by mouth every morning. Prostate health.   Yes Historical Provider, MD  psyllium (METAMUCIL) 58.6 % packet Take 1 packet by mouth daily.   Yes Historical Provider, MD  rosuvastatin (CRESTOR) 40 MG tablet Take 40 mg by mouth every evening.   Yes Historical Provider, MD  tadalafil (CIALIS) 5 MG tablet Take 5 mg by mouth daily as needed for erectile dysfunction.    Yes Historical Provider, MD  tamsulosin (FLOMAX) 0.4 MG CAPS capsule Take 0.4 mg by mouth every evening.   Yes Historical Provider, MD  traMADol (ULTRAM) 50 MG tablet Take 50 mg by mouth every evening.   Yes Historical Provider, MD  oxyCODONE-acetaminophen (ROXICET) 5-325 MG per tablet Take 1-2 tablets by mouth every 4 (four) hours as needed for severe pain. Patient not taking: Reported on 12/25/2014 08/09/14   Greer Pickerel, MD   BP 104/59 mmHg  Pulse 86  Temp(Src) 97.8 F (36.6 C) (Oral)  Resp 23  Wt 175 lb (79.379 kg)  SpO2 94% Physical Exam  Constitutional: He is oriented to person, place, and time. He appears well-developed and well-nourished.  Patient is a slender, well-nourished well-developed male. He is pale in appearance. No respiratory distress. Mental status is clear.  HENT:  Head: Normocephalic and atraumatic.  Mouth/Throat: Oropharynx is clear and moist.  Eyes: EOM are normal. Pupils are equal, round, and reactive to light. No scleral icterus.  Neck: Neck supple.  Cardiovascular: Normal rate, regular rhythm, normal heart sounds and intact distal pulses.   Femoral pulses 2+ and symmetric.  Pulmonary/Chest: Effort normal and breath sounds normal. No respiratory distress.  Abdominal: Soft. Bowel sounds are normal. He exhibits no distension. There is tenderness.  Moderate  diffuse central lower abdominal pain.  Musculoskeletal: Normal range of motion. He exhibits no edema or tenderness.  Neurological: He is alert and oriented to person, place, and time. He has normal strength. No cranial nerve deficit. He exhibits normal muscle tone. Coordination normal. GCS eye subscore is 4. GCS verbal subscore is 5. GCS motor subscore is 6.  Skin: Skin is warm, dry and intact. There is pallor.  Psychiatric: He has a normal mood and affect.    ED Course  Procedures (including critical care time) Labs Review Labs Reviewed  COMPREHENSIVE METABOLIC PANEL - Abnormal; Notable for the following:    Sodium 129 (*)    Chloride 86 (*)    CO2 21 (*)    Glucose, Bld 132 (*)    BUN 81 (*)    Creatinine, Ser 5.41 (*)    Total Protein 8.2 (*)    GFR calc non Af Amer 10 (*)    GFR calc  Af Amer 11 (*)    Anion gap 22 (*)    All other components within normal limits  CBC WITH DIFFERENTIAL/PLATELET - Abnormal; Notable for the following:    WBC 14.7 (*)    Neutro Abs 11.1 (*)    Monocytes Absolute 1.6 (*)    All other components within normal limits  LIPASE, BLOOD - Abnormal; Notable for the following:    Lipase 20 (*)    All other components within normal limits  PHOSPHORUS - Abnormal; Notable for the following:    Phosphorus 7.5 (*)    All other components within normal limits  CBG MONITORING, ED - Abnormal; Notable for the following:    Glucose-Capillary 147 (*)    All other components within normal limits  MAGNESIUM  CK  TROPONIN I  URINALYSIS, ROUTINE W REFLEX MICROSCOPIC (NOT AT [facility])  I-STAT CG4 LACTIC ACID, ED  I-STAT CG4 LACTIC ACID, ED    Imaging Review Ct Abdomen Pelvis Wo Contrast  12/25/2014   CLINICAL DATA:  Low abdomen pain since Sunday.  EXAM: CT ABDOMEN AND PELVIS WITHOUT CONTRAST  TECHNIQUE: Multidetector CT imaging of the abdomen and pelvis was performed following the standard protocol without IV contrast.  COMPARISON:  None.  FINDINGS: There are  multiple dilated proximal and mid small bowel loops with relative sparing of ileal small bowel loops and transition in probably an ileal loop in the right lower quadrant. The colon is normal. The appendix is normal.  The liver, spleen, pancreas, gallbladder, adrenal glands are normal. There is no hydronephrosis bilaterally. No nephrolithiasis is identified bilaterally. There is a midpole right kidney cyst measuring 2.1 cm. There is atherosclerosis of the aorta without aneurysmal dilatation. There is no abdominal lymphadenopathy.  Fluid-filled bladder is normal. Pelvic phleboliths are identified. Prostate gland is normal.  Minimal dependent atelectasis of the posterior lung bases are identified. Degenerative joint changes of the spine are noted.  IMPRESSION: Multiple dilated small bowel loops with relative sparing of ileal small bowel loops in transition in probably an ileal loop in the right lower quadrant. Differential diagnosis includes early or partial small bowel obstruction. Followup is recommended.   Electronically Signed   By: Abelardo Diesel M.D.   On: 12/25/2014 13:34   I have personally reviewed and evaluated these images and lab results as part of my medical decision-making.   EKG Interpretation None     CRITICAL CARE Performed by: Charlesetta Shanks   Total critical care time: 60  Critical care time was exclusive of separately billable procedures and treating other patients.  Critical care was necessary to treat or prevent imminent or life-threatening deterioration.  Critical care was time spent personally by me on the following activities: development of treatment plan with patient and/or surrogate as well as nursing, discussions with consultants, evaluation of patient's response to treatment, examination of patient, obtaining history from patient or surrogate, ordering and performing treatments and interventions, ordering and review of laboratory studies, ordering and review of  radiographic studies, pulse oximetry and re-evaluation of patient's condition. MDM   Final diagnoses:  Acute renal failure, unspecified acute renal failure type  Dehydration  Ileus  Gastroenteritis due to toxin in food   Patient presents with severe dehydration after suspected food borne illness. Patient has associated acute renal failure. Rehydration has been initiated in emergency department the patient is showing positive response with improving blood pressure. He identified lower abdominal pain which is suggestive of ileus versus small bowel obstruction. Gen. surgery was consulted and  has evaluated the patient. Nephrology has also been consult it for the patient's acute renal failure. The patient will be admitted for ongoing fluid replacement and management. His mental status is clear without respiratory distress.    Charlesetta Shanks, MD 12/25/14 [phone]

## 2014-12-25 NOTE — H&P (Signed)
PCP:   Tawanna Solo, MD   Chief Complaint:  Nausea and vomiting  HPI: 69 year old male who   has a past medical history of Hypertension; Urinary incontinence; GERD (gastroesophageal reflux disease); and Arthritis.  The presence the hospital with complaints of nausea and vomiting which started on Tuesday morning.  As per patient he was on vacation at  [place], and on saturday night he ate oysters, next day he ate lobster roll at lunchtime and then he started having abdominal pain.  Patient feel that to [facility] and saw a physician at urgent care and was given nausea and pain medication.  Patient said that he started having vomiting Tuesday morning, he denies diarrhea.  During this time.  Patient did not eat much but was able to drink 40 ounces of Gatorade.  This morning also patient vomited and did not "taken to the hospital for further evaluation In the ED patient found to be hypotensive with acute kidney injury with creatinine 5.41, CT abdomen pelvis showed early partial small bowel obstruction. He denies fever, no chest pain, no shortness of breath. Patient had one episode of syncope yesterday.   Allergies:  No Known Allergies    Past Medical History  Diagnosis Date  . Hypertension   . Urinary incontinence   . GERD (gastroesophageal reflux disease)   . Arthritis     Past Surgical History  Procedure Laterality Date  . Tonsillectomy    . Hernia repair      right inguinal hernia repair   . Inguinal hernia repair Left 08/09/2014    Procedure: LAPAROSCOPIC REPAIR OF LEFT INGUINAL HERNIA WITH MESH;  Surgeon: Greer Pickerel, MD;  Location: [facility];  Service: General;  Laterality: Left;    Prior to Admission medications   Medication Sig Start Date End Date Taking? Authorizing Provider  acetaminophen (TYLENOL) 500 MG tablet Take 500-1,000 mg by mouth every 6 (six) hours as needed for moderate pain or headache.   Yes Historical Provider, MD  aspirin EC 325 MG tablet Take 325 mg by  mouth every evening.   Yes Historical Provider, MD  Cholecalciferol (VITAMIN D PO) Take 2,400 Units by mouth every morning.   Yes Historical Provider, MD  desonide (DESOWEN) 0.05 % cream Apply 1 application topically daily as needed (rash).   Yes Historical Provider, MD  lisinopril-hydrochlorothiazide (PRINZIDE,ZESTORETIC) 20-12.5 MG per tablet Take 1 tablet by mouth every morning.   Yes Historical Provider, MD  meloxicam (MOBIC) 15 MG tablet Take 15 mg by mouth every evening.   Yes Historical Provider, MD  metoprolol succinate (TOPROL-XL) 50 MG 24 hr tablet Take 50 mg by mouth every morning. Take with or immediately following a meal.   Yes Historical Provider, MD  Multiple Vitamin (MULTIVITAMIN WITH MINERALS) TABS tablet Take 1 tablet by mouth 2 (two) times daily. Morning and Noon.   Yes Historical Provider, MD  Nutritional Supplements (SOY PROTEIN SHAKE PO) Take 2 scoop by mouth daily.    Yes Historical Provider, MD  Nutritional Supplements (WELLNESS ESSENTIALS FOR JOINT PO) Take 1 tablet by mouth every morning.   Yes Historical Provider, MD  omeprazole (PRILOSEC) 20 MG capsule Take 20 mg by mouth every morning.   Yes Historical Provider, MD  ondansetron (ZOFRAN) 4 MG tablet Take 4 mg by mouth every 8 (eight) hours as needed for nausea or vomiting.   Yes Historical Provider, MD  OVER THE COUNTER MEDICATION Take 1 tablet by mouth 2 (two) times daily. Co-Q 10-- 120 mg + Red  Yeast Rice 1200 mg.   Yes Historical Provider, MD  OVER THE COUNTER MEDICATION Take 1 tablet by mouth every morning. Garlic Cholesterol Health.   Yes Historical Provider, MD  OVER THE COUNTER MEDICATION Take 1 tablet by mouth every morning. Prostate health.   Yes Historical Provider, MD  psyllium (METAMUCIL) 58.6 % packet Take 1 packet by mouth daily.   Yes Historical Provider, MD  rosuvastatin (CRESTOR) 40 MG tablet Take 40 mg by mouth every evening.   Yes Historical Provider, MD  tadalafil (CIALIS) 5 MG tablet Take 5 mg by mouth  daily as needed for erectile dysfunction.    Yes Historical Provider, MD  tamsulosin (FLOMAX) 0.4 MG CAPS capsule Take 0.4 mg by mouth every evening.   Yes Historical Provider, MD  traMADol (ULTRAM) 50 MG tablet Take 50 mg by mouth every evening.   Yes Historical Provider, MD  oxyCODONE-acetaminophen (ROXICET) 5-325 MG per tablet Take 1-2 tablets by mouth every 4 (four) hours as needed for severe pain. Patient not taking: Reported on 12/25/2014 08/09/14   Greer Pickerel, MD    Social History:  reports that he has quit smoking. He has never used smokeless tobacco. He reports that he drinks alcohol. He reports that he does not use illicit drugs.  History reviewed. No pertinent family history.  Filed Weights   12/25/14 1122  Weight: 79.379 kg (175 lb)    All the positives are listed in BOLD  Review of Systems:  HEENT: Headache, blurred vision, runny nose, sore throat Neck: Hypothyroidism, hyperthyroidism,,lymphadenopathy Chest : Shortness of breath, history of COPD, Asthma Heart : Chest pain, history of coronary arterey disease GI:  Nausea, vomiting, diarrhea, constipation, GERD GU: Dysuria, urgency, frequency of urination, hematuria Neuro: Stroke, seizures, syncope Psych: Depression, anxiety, hallucinations   Physical Exam: Blood pressure 100/62, pulse 87, temperature 97.8 F (36.6 C), temperature source Oral, resp. rate 14, weight 79.379 kg (175 lb), SpO2 95 %. Constitutional:   Patient is a well-developed and well-nourished male  in no acute distress and cooperative with exam. Head: Normocephalic and atraumatic Mouth: Mucus membranes moist Eyes: PERRL, EOMI, conjunctivae normal Neck: Supple, No Thyromegaly Cardiovascular: RRR, S1 normal, S2 normal Pulmonary/Chest: CTAB, no wheezes, rales, or rhonchi Abdominal: Soft. Non-tender, non-distended, bowel sounds are normal, no masses, organomegaly, or guarding present.  Neurological: A&O x3, Strength is normal and symmetric bilaterally,  cranial nerve II-XII are grossly intact, no focal motor deficit, sensory intact to light touch bilaterally.  Extremities : No Cyanosis, Clubbing or Edema  Labs on Admission:  Basic Metabolic Panel:  Recent Labs Lab 12/25/14 1120  NA 129*  K 4.3  CL 86*  CO2 21*  GLUCOSE 132*  BUN 81*  CREATININE 5.41*  CALCIUM 9.2  MG 2.0  PHOS 7.5*   Liver Function Tests:  Recent Labs Lab 12/25/14 1120  AST 25  ALT 26  ALKPHOS 61  BILITOT 0.5  PROT 8.2*  ALBUMIN 4.2    Recent Labs Lab 12/25/14 1120  LIPASE 20*   No results for input(s): AMMONIA in the last 168 hours. CBC:  Recent Labs Lab 12/25/14 1120  WBC 14.7*  NEUTROABS 11.1*  HGB 13.8  HCT 39.9  MCV 90.7  PLT 305   Cardiac Enzymes:  Recent Labs Lab 12/25/14 1120  CKTOTAL 267  TROPONINI 0.03     CBG:  Recent Labs Lab 12/25/14 1116  GLUCAP 147*    Radiological Exams on Admission: Ct Abdomen Pelvis Wo Contrast  12/25/2014   CLINICAL DATA:  Low abdomen pain since Sunday.  EXAM: CT ABDOMEN AND PELVIS WITHOUT CONTRAST  TECHNIQUE: Multidetector CT imaging of the abdomen and pelvis was performed following the standard protocol without IV contrast.  COMPARISON:  None.  FINDINGS: There are multiple dilated proximal and mid small bowel loops with relative sparing of ileal small bowel loops and transition in probably an ileal loop in the right lower quadrant. The colon is normal. The appendix is normal.  The liver, spleen, pancreas, gallbladder, adrenal glands are normal. There is no hydronephrosis bilaterally. No nephrolithiasis is identified bilaterally. There is a midpole right kidney cyst measuring 2.1 cm. There is atherosclerosis of the aorta without aneurysmal dilatation. There is no abdominal lymphadenopathy.  Fluid-filled bladder is normal. Pelvic phleboliths are identified. Prostate gland is normal.  Minimal dependent atelectasis of the posterior lung bases are identified. Degenerative joint changes of the  spine are noted.  IMPRESSION: Multiple dilated small bowel loops with relative sparing of ileal small bowel loops in transition in probably an ileal loop in the right lower quadrant. Differential diagnosis includes early or partial small bowel obstruction. Followup is recommended.   Electronically Signed   By: Abelardo Diesel M.D.   On: 12/25/2014 13:34     Assessment/Plan Active Problems:   AKI (acute kidney injury)   Hypertension  Acute kidney injury   likely combination of dehydration as well as hypotension from the hypertensive medications  patient presented with acute kidney injury from dehydration, gastroenteritis.  Will continue with IV fluids normal saline at 125 mL/hr. we'll check urine sodium, creatinine.    Early partial small bowel obstruction  CT scan showed multiple dilated small bowel loops with relative sparing of ileal small bowel loops in transition possibility of early or partial small bowel obstruction.  General surgery has been consulted.  Will follow the recommendations   Hyponatremia Likely from dehydration Sodium is 129, check serum osmolality Follow BMP in a.m.  History of hypertension  Currently patient hypotensive from dehydration.  Hold the antihypertensives medications at this time.    DVT prophylaxis Heparin  Code status:Full code   Family discussion: no family at bedside   Time Spent on Admission: 60 minutes   [facility] Pager: [phone] 12/25/2014, 3:22 PM  If 7PM-7AM, please contact night-coverage  [email or web address]  Password [email or web address]

## 2014-12-25 NOTE — ED Notes (Signed)
Pt was unable to provide urine sample

## 2014-12-26 ENCOUNTER — Inpatient Hospital Stay (HOSPITAL_COMMUNITY): Payer: Medicare Other

## 2014-12-26 LAB — CBC
HCT: 35.3 % — ABNORMAL LOW (ref 39.0–52.0)
Hemoglobin: 12 g/dL — ABNORMAL LOW (ref 13.0–17.0)
MCH: 30.9 pg (ref 26.0–34.0)
MCHC: 34 g/dL (ref 30.0–36.0)
MCV: 91 fL (ref 78.0–100.0)
PLATELETS: 250 10*3/uL (ref 150–400)
RBC: 3.88 MIL/uL — ABNORMAL LOW (ref 4.22–5.81)
RDW: 14.3 % (ref 11.5–15.5)
WBC: 9.2 10*3/uL (ref 4.0–10.5)

## 2014-12-26 LAB — COMPREHENSIVE METABOLIC PANEL
ALT: 23 U/L (ref 17–63)
AST: 15 U/L (ref 15–41)
Albumin: 3.1 g/dL — ABNORMAL LOW (ref 3.5–5.0)
Alkaline Phosphatase: 49 U/L (ref 38–126)
Anion gap: 11 (ref 5–15)
BUN: 60 mg/dL — ABNORMAL HIGH (ref 6–20)
CHLORIDE: 99 mmol/L — AB (ref 101–111)
CO2: 25 mmol/L (ref 22–32)
CREATININE: 1.87 mg/dL — AB (ref 0.61–1.24)
Calcium: 8.8 mg/dL — ABNORMAL LOW (ref 8.9–10.3)
GFR, EST AFRICAN AMERICAN: 41 mL/min — AB (ref >60)
GFR, EST NON AFRICAN AMERICAN: 36 mL/min — AB (ref >60)
Glucose, Bld: 107 mg/dL — ABNORMAL HIGH (ref 65–99)
POTASSIUM: 3.7 mmol/L (ref 3.5–5.1)
Sodium: 135 mmol/L (ref 135–145)
Total Bilirubin: 0.8 mg/dL (ref 0.3–1.2)
Total Protein: 6.6 g/dL (ref 6.5–8.1)

## 2014-12-26 LAB — OSMOLALITY: Osmolality: 301 mOsm/kg — ABNORMAL HIGH (ref 275–300)

## 2014-12-26 NOTE — Progress Notes (Signed)
Subjective: He feels better, playing solitaire on phone.  No pain, he isn't distended. + BS and + BM.  Objective: Vital signs in last 24 hours: Temp:  [97.8 F (36.6 C)-99 F (37.2 C)] 98.2 F (36.8 C) (09/08 0441) Pulse Rate:  [66-88] 79 (09/08 0441) Resp:  [14-23] 20 (09/08 0441) BP: (60-138)/(26-98) 129/80 mmHg (09/08 0441) SpO2:  [91 %-99 %] 92 % (09/08 0441) Weight:  [79.379 kg (175 lb)] 79.379 kg (175 lb) (09/07 1122) Last BM Date: 12/25/14 Nothing PO recorded 2 stools recorded Nothing IV or urine output recorded. TM 99.3, BP improving Creatinine and WBC much better. NA back to normal AM films just completed, not read yet.  He still has a few SB loops distended with air in the colon.I do not see any contrast. Intake/Output from previous day: 09/07 0701 - 09/08 0700 In: -  Out: 2 [Stool:2] Intake/Output this shift:    General appearance: alert, cooperative and no distress GI: soft, non-tender; bowel sounds normal; no masses,  no organomegaly  Lab Results:   Recent Labs  12/25/14 1120 12/26/14 0443  WBC 14.7* 9.2  HGB 13.8 12.0*  HCT 39.9 35.3*  PLT 305 250    BMET  Recent Labs  12/25/14 1120 12/26/14 0443  NA 129* 135  K 4.3 3.7  CL 86* 99*  CO2 21* 25  GLUCOSE 132* 107*  BUN 81* 60*  CREATININE 5.41* 1.87*  CALCIUM 9.2 8.8*   PT/INR No results for input(s): LABPROT, INR in the last 72 hours.   Recent Labs Lab 12/25/14 1120 12/26/14 0443  AST 25 15  ALT 26 23  ALKPHOS 61 49  BILITOT 0.5 0.8  PROT 8.2* 6.6  ALBUMIN 4.2 3.1*     Lipase     Component Value Date/Time   LIPASE 20* 12/25/2014 1120     Studies/Results: Ct Abdomen Pelvis Wo Contrast  12/25/2014   CLINICAL DATA:  Low abdomen pain since Sunday.  EXAM: CT ABDOMEN AND PELVIS WITHOUT CONTRAST  TECHNIQUE: Multidetector CT imaging of the abdomen and pelvis was performed following the standard protocol without IV contrast.  COMPARISON:  None.  FINDINGS: There are multiple  dilated proximal and mid small bowel loops with relative sparing of ileal small bowel loops and transition in probably an ileal loop in the right lower quadrant. The colon is normal. The appendix is normal.  The liver, spleen, pancreas, gallbladder, adrenal glands are normal. There is no hydronephrosis bilaterally. No nephrolithiasis is identified bilaterally. There is a midpole right kidney cyst measuring 2.1 cm. There is atherosclerosis of the aorta without aneurysmal dilatation. There is no abdominal lymphadenopathy.  Fluid-filled bladder is normal. Pelvic phleboliths are identified. Prostate gland is normal.  Minimal dependent atelectasis of the posterior lung bases are identified. Degenerative joint changes of the spine are noted.  IMPRESSION: Multiple dilated small bowel loops with relative sparing of ileal small bowel loops in transition in probably an ileal loop in the right lower quadrant. Differential diagnosis includes early or partial small bowel obstruction. Followup is recommended.   Electronically Signed   By: Abelardo Diesel M.D.   On: 12/25/2014 13:34    Medications: . heparin  5,000 Units Subcutaneous 3 times per day   . sodium chloride 125 mL/hr at 12/26/14 0435    Assessment/Plan PSBO  Severe dehydration with Acute renal failure Hx of Hypertension Chronic back pain GERD Antibiotics:  None DVT: heparin/SCD    Plan:  I don't think he has completely resolved, but I  will start some clears, I told him to go slow and stop if he has any nausea at all with clears.  I will get a single view in the AM. Renal function better with fluids and holding meds..    LOS: 1 day    Veyda Kaufman 12/26/2014

## 2014-12-26 NOTE — Progress Notes (Signed)
  [facility] Progress Note   Subjective: creat down 1.8, feeling better  Filed Vitals:   12/25/14 1600 12/25/14 1642 12/25/14 2056 12/26/14 0441  BP: 101/57 97/64 124/76 129/80  Pulse: 87 78 87 79  Temp:  99 F (37.2 C) 98.6 F (37 C) 98.2 F (36.8 C)  TempSrc:  Oral Oral Oral  Resp: 21  21 20   Height:      Weight:      SpO2: 92% 97% 95% 92%   Exam: *Alert, no distress, calm No rash, cyanosis or gangrene Sclera anicteric, throat clear NO jvd Chest clear bilat no wheezes or rales RRR no MRG Abd soft ntnd no mass or ascites GU normal male No LE or UE edema No joint effusion / deformity Neuro is nf, ox 3,, no asterixis  UA - pending Na 129 K 4.3 CO2 21 CL 86 BUN 81 Creat 5.41 Ca 9.2 P 7.5 Alb 4.2 wBC 14k Hb 13 Plt 305 CT abd > normal kidneys, possible PSBO  Assessment: 1 Acute renal failure - functional ARF from vol depletion/ ACEi and nsaid's. Much better today with fluid resuscitation . Should hold ACEi/ nsaids for 3 weeks minimum.  In future if he has a dehydrating illness he should hold his ACEi/ lisinopril and avoid nsaid's. I explained this to him. Will sign off.  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 heparin  5,000 Units Subcutaneous 3 times per day   . sodium chloride 125 mL/hr at 12/26/14 1403   acetaminophen **OR**  acetaminophen, HYDROcodone-acetaminophen, ondansetron **OR** ondansetron (ZOFRAN) IV

## 2014-12-26 NOTE — Progress Notes (Signed)
[facility] PROGRESS NOTE  Johnny Peck FUX:[id number] DOB: 1945-11-04 DOA: 12/25/2014 PCP: Tawanna Solo, MD  Assessment/Plan: 1. Acute kidney injury- improving, today creatinine 1.87, multifactorial from gastroenteritis, poor by mouth intake, medications. Nephrology following. 2. Partial small bowel obstruction- improving, general surgery following. Patient had BMs this morning. 3. Hyponatremia- resolved, likely from dehydration today sodium is 135. 4. History of hypertension- blood pressure medications on hold. 5. DVT prophylaxis- heparin  Code Status: Full code Family Communication: No family at bedside Disposition Plan: Home when stable   Consultants:  Nephrology  Surgery   Procedures:  None  Antibiotics:  None  HPI/Subjective: 69 year old male who  has a past medical history of Hypertension; Urinary incontinence; GERD (gastroesophageal reflux disease); and Arthritis.  The presence the hospital with complaints of nausea and vomiting which started on Tuesday morning. As per patient he was on vacation at [place], and on saturday night he ate oysters, next day he ate lobster roll at lunchtime and then he started having abdominal pain. Patient feel that to [facility] and saw a physician at urgent care and was given nausea and pain medication. Patient said that he started having vomiting Tuesday morning, he denies diarrhea. During this time. Patient did not eat much but was able to drink 40 ounces of Gatorade. This morning also patient vomited and did not "taken to the hospital for further evaluation In the ED patient found to be hypotensive with acute kidney injury with creatinine 5.41, CT abdomen pelvis showed early partial small bowel obstruction.  Patient feels better today, creatinine is 1.87 today. Had BM's this morning.  Objective: Filed Vitals:   12/26/14 0441  BP: 129/80  Pulse: 79  Temp: 98.2 F (36.8 C)  Resp: 20    Intake/Output Summary  (Last 24 hours) at 12/26/14 1436 Last data filed at 12/26/14 0300  Gross per 24 hour  Intake      0 ml  Output      2 ml  Net     -2 ml   Filed Weights   12/25/14 1122  Weight: 79.379 kg (175 lb)    Exam:   General:  Appears in no acute distress  Cardiovascular: S1s2 RRR  Respiratory: Clear bilaterally  Abdomen: Soft, nontender, no organomegaly  Musculoskeletal: No cyanosis/clubbing/edema of the lower extremities   Data Reviewed: Basic Metabolic Panel:  Recent Labs Lab 12/25/14 1120 12/26/14 0443  NA 129* 135  K 4.3 3.7  CL 86* 99*  CO2 21* 25  GLUCOSE 132* 107*  BUN 81* 60*  CREATININE 5.41* 1.87*  CALCIUM 9.2 8.8*  MG 2.0  --   PHOS 7.5*  --    Liver Function Tests:  Recent Labs Lab 12/25/14 1120 12/26/14 0443  AST 25 15  ALT 26 23  ALKPHOS 61 49  BILITOT 0.5 0.8  PROT 8.2* 6.6  ALBUMIN 4.2 3.1*    Recent Labs Lab 12/25/14 1120  LIPASE 20*   No results for input(s): AMMONIA in the last 168 hours. CBC:  Recent Labs Lab 12/25/14 1120 12/26/14 0443  WBC 14.7* 9.2  NEUTROABS 11.1*  --   HGB 13.8 12.0*  HCT 39.9 35.3*  MCV 90.7 91.0  PLT 305 250   Cardiac Enzymes:  Recent Labs Lab 12/25/14 1120  CKTOTAL 267  TROPONINI 0.03   BNP (last 3 results) No results for input(s): BNP in the last 8760 hours.  ProBNP (last 3 results) No results for input(s): PROBNP in the last 8760 hours.  CBG:  Recent  Labs Lab 12/25/14 1116  GLUCAP 147*    No results found for this or any previous visit (from the past 240 hour(s)).   Studies: Ct Abdomen Pelvis Wo Contrast  12/25/2014   CLINICAL DATA:  Low abdomen pain since Sunday.  EXAM: CT ABDOMEN AND PELVIS WITHOUT CONTRAST  TECHNIQUE: Multidetector CT imaging of the abdomen and pelvis was performed following the standard protocol without IV contrast.  COMPARISON:  None.  FINDINGS: There are multiple dilated proximal and mid small bowel loops with relative sparing of ileal small bowel loops and  transition in probably an ileal loop in the right lower quadrant. The colon is normal. The appendix is normal.  The liver, spleen, pancreas, gallbladder, adrenal glands are normal. There is no hydronephrosis bilaterally. No nephrolithiasis is identified bilaterally. There is a midpole right kidney cyst measuring 2.1 cm. There is atherosclerosis of the aorta without aneurysmal dilatation. There is no abdominal lymphadenopathy.  Fluid-filled bladder is normal. Pelvic phleboliths are identified. Prostate gland is normal.  Minimal dependent atelectasis of the posterior lung bases are identified. Degenerative joint changes of the spine are noted.  IMPRESSION: Multiple dilated small bowel loops with relative sparing of ileal small bowel loops in transition in probably an ileal loop in the right lower quadrant. Differential diagnosis includes early or partial small bowel obstruction. Followup is recommended.   Electronically Signed   By: Abelardo Diesel M.D.   On: 12/25/2014 13:34   Dg Abd 2 Views  12/26/2014   CLINICAL DATA:  Followup small bowel obstruction  EXAM: ABDOMEN - 2 VIEW  COMPARISON:  12/25/2014  FINDINGS: Scattered large and small bowel gas is noted. Few air-fluid levels are noted within the small bowel with evidence of mild dilatation. The overall appearance is stable from the prior exam. No free air is seen. No abnormal mass lesion is noted. Degenerative changes of the lumbar spine are seen.  IMPRESSION: Stable small bowel dilatation with air-fluid levels. No free air is seen. Given the amount of colonic gas is likely represents a partial small bowel obstruction.   Electronically Signed   By: Inez Catalina M.D.   On: 12/26/2014 09:02    Scheduled Meds: . heparin  5,000 Units Subcutaneous 3 times per day   Continuous Infusions: . sodium chloride 125 mL/hr at 12/26/14 1403    Active Problems:   AKI (acute kidney injury)   Hypertension   Ileus   Gastroenteritis due to food toxin    Time spent:  25 min    [facility] Pager [phone]. If 7PM-7AM, please contact night-coverage at [email or web address], password [facility] 12/26/2014, 2:36 PM  LOS: 1 day

## 2014-12-27 ENCOUNTER — Inpatient Hospital Stay (HOSPITAL_COMMUNITY): Payer: Medicare Other

## 2014-12-27 LAB — BASIC METABOLIC PANEL
Anion gap: 8 (ref 5–15)
BUN: 18 mg/dL (ref 6–20)
CALCIUM: 8.6 mg/dL — AB (ref 8.9–10.3)
CHLORIDE: 104 mmol/L (ref 101–111)
CO2: 24 mmol/L (ref 22–32)
CREATININE: 0.85 mg/dL (ref 0.61–1.24)
Glucose, Bld: 109 mg/dL — ABNORMAL HIGH (ref 65–99)
Potassium: 3.8 mmol/L (ref 3.5–5.1)
SODIUM: 136 mmol/L (ref 135–145)

## 2014-12-27 NOTE — Discharge Summary (Signed)
Physician Discharge Summary  Linda Biehn XHB:[id number] DOB: 08/03/1945 DOA: 12/25/2014  PCP: Tawanna Solo, MD  Admit date: 12/25/2014 Discharge date: 12/27/2014  Time spent: 25* minutes  Recommendations for Outpatient Follow-up:  1. *Follow up PCP in 2 weeks  Discharge Diagnoses:  Active Problems:   AKI (acute kidney injury)   Hypertension   Ileus   Gastroenteritis due to food toxin   Discharge Condition: Stable  Diet recommendation: Regular diet  Filed Weights   12/25/14 1122  Weight: 79.379 kg (175 lb)    History of present illness:  69 year old male who  has a past medical history of Hypertension; Urinary incontinence; GERD (gastroesophageal reflux disease); and Arthritis.  The presence the hospital with complaints of nausea and vomiting which started on Tuesday morning. As per patient he was on vacation at [place], and on saturday night he ate oysters, next day he ate lobster roll at lunchtime and then he started having abdominal pain. Patient feel that to [facility] and saw a physician at urgent care and was given nausea and pain medication. Patient said that he started having vomiting Tuesday morning, he denies diarrhea. During this time. Patient did not eat much but was able to drink 40 ounces of Gatorade. This morning also patient vomited and did not "taken to the hospital for further evaluation In the ED patient found to be hypotensive with acute kidney injury with creatinine 5.41, CT abdomen pelvis showed early partial small bowel obstruction.  Hospital Course:  1. Acute kidney injury- improving, today creatinine 0.85, multifactorial from gastroenteritis, poor by mouth intake, medications. Nephrology has signed off and recommend holding ACE for three weeks 2. Partial small bowel obstruction- resolved,  improving, general surgery following. Patient had BMs this morning.Tolerated the diet well, called and discussed with Dr Dalbert Batman, will discharge  home. 3. Hyponatremia- resolved, likely from dehydration today sodium is 135. 4. History of hypertension- will hold the Zestoretic, continue Metoprolol.  Procedures:  None  Consultations:  Nephrology   Discharge Exam: Filed Vitals:   12/27/14 0430  BP: 126/66  Pulse: 70  Temp: 98.4 F (36.9 C)  Resp: 20    General: Appears in no acute distress Cardiovascular: S1S2 RRR Respiratory: Clear bilaterally  Discharge Instructions   Discharge Instructions    Diet - low sodium heart healthy    Complete by:  As directed      Increase activity slowly    Complete by:  As directed           Current Discharge Medication List    CONTINUE these medications which have NOT CHANGED   Details  acetaminophen (TYLENOL) 500 MG tablet Take 500-1,000 mg by mouth every 6 (six) hours as needed for moderate pain or headache.    aspirin EC 325 MG tablet Take 325 mg by mouth every evening.    Cholecalciferol (VITAMIN D PO) Take 2,400 Units by mouth every morning.    desonide (DESOWEN) 0.05 % cream Apply 1 application topically daily as needed (rash).    metoprolol succinate (TOPROL-XL) 50 MG 24 hr tablet Take 50 mg by mouth every morning. Take with or immediately following a meal.    Multiple Vitamin (MULTIVITAMIN WITH MINERALS) TABS tablet Take 1 tablet by mouth 2 (two) times daily. Morning and Noon.    Nutritional Supplements (SOY PROTEIN SHAKE PO) Take 2 scoop by mouth daily.     Nutritional Supplements (WELLNESS ESSENTIALS FOR JOINT PO) Take 1 tablet by mouth every morning.    omeprazole (PRILOSEC) 20 MG  capsule Take 20 mg by mouth every morning.    ondansetron (ZOFRAN) 4 MG tablet Take 4 mg by mouth every 8 (eight) hours as needed for nausea or vomiting.    !! OVER THE COUNTER MEDICATION Take 1 tablet by mouth 2 (two) times daily. Co-Q 10-- 120 mg + Red Yeast Rice 1200 mg.    !! OVER THE COUNTER MEDICATION Take 1 tablet by mouth every morning. Garlic Cholesterol Health.    !!  OVER THE COUNTER MEDICATION Take 1 tablet by mouth every morning. Prostate health.    psyllium (METAMUCIL) 58.6 % packet Take 1 packet by mouth daily.    rosuvastatin (CRESTOR) 40 MG tablet Take 40 mg by mouth every evening.    tadalafil (CIALIS) 5 MG tablet Take 5 mg by mouth daily as needed for erectile dysfunction.     tamsulosin (FLOMAX) 0.4 MG CAPS capsule Take 0.4 mg by mouth every evening.    traMADol (ULTRAM) 50 MG tablet Take 50 mg by mouth every evening.    oxyCODONE-acetaminophen (ROXICET) 5-325 MG per tablet Take 1-2 tablets by mouth every 4 (four) hours as needed for severe pain. Qty: 40 tablet, Refills: 0     !! - Potential duplicate medications found. Please discuss with provider.    STOP taking these medications     lisinopril-hydrochlorothiazide (PRINZIDE,ZESTORETIC) 20-12.5 MG per tablet      meloxicam (MOBIC) 15 MG tablet        No Known Allergies Follow-up Information    Follow up with Tawanna Solo, MD.   Specialty:  Family Medicine   Why:  Call and let her know what happened.  Follow up for medical issues as needed.   Contact information:   [place] [address] [phone]       Call [facility].   Specialty:  General Surgery   Why:  Call if you have any issues, or feel like this is re-occuring.     Contact information:   [place] [address] [phone]        The results of significant diagnostics from this hospitalization (including imaging, microbiology, ancillary and laboratory) are listed below for reference.    Significant Diagnostic Studies: Ct Abdomen Pelvis Wo Contrast  12/25/2014   CLINICAL DATA:  Low abdomen pain since Sunday.  EXAM: CT ABDOMEN AND PELVIS WITHOUT CONTRAST  TECHNIQUE: Multidetector CT imaging of the abdomen and pelvis was performed following the standard protocol without IV contrast.  COMPARISON:  None.  FINDINGS: There are multiple dilated proximal and  mid small bowel loops with relative sparing of ileal small bowel loops and transition in probably an ileal loop in the right lower quadrant. The colon is normal. The appendix is normal.  The liver, spleen, pancreas, gallbladder, adrenal glands are normal. There is no hydronephrosis bilaterally. No nephrolithiasis is identified bilaterally. There is a midpole right kidney cyst measuring 2.1 cm. There is atherosclerosis of the aorta without aneurysmal dilatation. There is no abdominal lymphadenopathy.  Fluid-filled bladder is normal. Pelvic phleboliths are identified. Prostate gland is normal.  Minimal dependent atelectasis of the posterior lung bases are identified. Degenerative joint changes of the spine are noted.  IMPRESSION: Multiple dilated small bowel loops with relative sparing of ileal small bowel loops in transition in probably an ileal loop in the right lower quadrant. Differential diagnosis includes early or partial small bowel obstruction. Followup is recommended.   Electronically Signed   By: Mallie Darting.D.  On: 12/25/2014 13:34   Dg Abd 1 View  12/27/2014   CLINICAL DATA:  Abdominal pain today. Follow-up small bowel obstruction. Abdominal pain for 5 days. Recent hernia repair a couple of months ago.  EXAM: ABDOMEN - 1 VIEW  COMPARISON:  12/26/2014 and earlier  FINDINGS: Persistent dilatation of small bowel loops identified in the central abdomen. No evidence for free air on this supine view. Gas identified within nondilated loops of colon. Degenerative changes in the mid lumbar spine.  IMPRESSION: Persistent dilatation of small bowel loops consistent with partial small bowel obstruction.   Electronically Signed   By: Nolon Nations M.D.   On: 12/27/2014 08:48   Dg Abd 2 Views  12/26/2014   CLINICAL DATA:  Followup small bowel obstruction  EXAM: ABDOMEN - 2 VIEW  COMPARISON:  12/25/2014  FINDINGS: Scattered large and small bowel gas is noted. Few air-fluid levels are noted within the small  bowel with evidence of mild dilatation. The overall appearance is stable from the prior exam. No free air is seen. No abnormal mass lesion is noted. Degenerative changes of the lumbar spine are seen.  IMPRESSION: Stable small bowel dilatation with air-fluid levels. No free air is seen. Given the amount of colonic gas is likely represents a partial small bowel obstruction.   Electronically Signed   By: Inez Catalina M.D.   On: 12/26/2014 09:02    Microbiology: No results found for this or any previous visit (from the past 240 hour(s)).   Labs: Basic Metabolic Panel:  Recent Labs Lab 12/25/14 1120 12/26/14 0443 12/27/14 0425  NA 129* 135 136  K 4.3 3.7 3.8  CL 86* 99* 104  CO2 21* 25 24  GLUCOSE 132* 107* 109*  BUN 81* 60* 18  CREATININE 5.41* 1.87* 0.85  CALCIUM 9.2 8.8* 8.6*  MG 2.0  --   --   PHOS 7.5*  --   --    Liver Function Tests:  Recent Labs Lab 12/25/14 1120 12/26/14 0443  AST 25 15  ALT 26 23  ALKPHOS 61 49  BILITOT 0.5 0.8  PROT 8.2* 6.6  ALBUMIN 4.2 3.1*    Recent Labs Lab 12/25/14 1120  LIPASE 20*   No results for input(s): AMMONIA in the last 168 hours. CBC:  Recent Labs Lab 12/25/14 1120 12/26/14 0443  WBC 14.7* 9.2  NEUTROABS 11.1*  --   HGB 13.8 12.0*  HCT 39.9 35.3*  MCV 90.7 91.0  PLT 305 250   Cardiac Enzymes:  Recent Labs Lab 12/25/14 1120  CKTOTAL 267  TROPONINI 0.03   BNP: BNP (last 3 results) No results for input(s): BNP in the last 8760 hours.  ProBNP (last 3 results) No results for input(s): PROBNP in the last 8760 hours.  CBG:  Recent Labs Lab 12/25/14 1116  GLUCAP 147*       Signed:  Alnita Aybar S  [facility] 12/27/2014, 1:36 PM

## 2014-12-27 NOTE — Discharge Instructions (Signed)
Low-Fiber Diet for at least the next week or until Bowel movements are normal again. Fiber is found in fruits, vegetables, and whole grains. A low-fiber diet restricts fibrous foods that are not digested in the small intestine. A diet containing about 10-15 grams of fiber per day is considered low fiber. Low-fiber diets may be used to:  Promote healing and rest the bowel during intestinal flare-ups.  Prevent blockage of a partially obstructed or narrowed gastrointestinal tract.  Reduce fecal weight and volume.  Slow the movement of feces. You may be on a low-fiber diet as a transitional diet following surgery, after an injury (trauma), or because of a short (acute) or lifelong (chronic) illness. Your health care provider will determine the length of time you need to stay on this diet.  WHAT DO I NEED TO KNOW ABOUT A LOW-FIBER DIET? Always check the fiber content on the packaging's Nutrition Facts label, especially on foods from the grains list. Ask your dietitian if you have questions about specific foods that are related to your condition, especially if the food is not listed below. In general, a low-fiber food will have less than 2 g of fiber. WHAT FOODS CAN I EAT? Grains All breads and crackers made with white flour. Sweet rolls, doughnuts, waffles, pancakes, [place] toast, bagels. Pretzels, Melba toast, zwieback. Well-cooked cereals, such as cornmeal, farina, or cream cereals. Dry cereals that do not contain whole grains, fruit, or nuts, such as refined corn, wheat, rice, and oat cereals. Potatoes prepared any way without skins, plain pastas and noodles, refined white rice. Use white flour for baking and making sauces. Use allowed list of grains for casseroles, dumplings, and puddings.  Vegetables Strained tomato and vegetable juices. Fresh lettuce, cucumber, spinach. Well-cooked (no skin or pulp) or canned vegetables, such as asparagus, bean sprouts, beets, carrots, green beans, mushrooms,  potatoes, pumpkin, spinach, yellow squash, tomato sauce/puree, turnips, yams, and zucchini. Keep servings limited to  cup.  Fruits All fruit juices except prune juice. Cooked or canned fruits without skin and seeds, such as applesauce, apricots, cherries, fruit cocktail, grapefruit, grapes, mandarin oranges, melons, peaches, pears, pineapple, and plums. Fresh fruits without skin, such as apricots, avocados, bananas, melons, pineapple, nectarines, and peaches. Keep servings limited to  cup or 1 piece.  Meat and Other Protein Sources Ground or well-cooked tender beef, ham, veal, lamb, pork, or poultry. Eggs, plain cheese. Fish, oysters, shrimp, lobster, and other seafood. Liver, organ meats. Smooth nut butters. Dairy All milk products and alternative dairy substitutes, such as soy, rice, almond, and coconut, not containing added whole nuts, seeds, or added fruit. Beverages Decaf coffee, fruit, and vegetable juices or smoothies (small amounts, with no pulp or skins, and with fruits from allowed list), sports drinks, herbal tea. Condiments Ketchup, mustard, vinegar, cream sauce, cheese sauce, cocoa powder. Spices in moderation, such as allspice, basil, bay leaves, celery powder or leaves, cinnamon, cumin powder, curry powder, ginger, mace, marjoram, onion or garlic powder, oregano, paprika, parsley flakes, ground pepper, rosemary, sage, savory, tarragon, thyme, and turmeric. Sweets and Desserts Plain cakes and cookies, pie made with allowed fruit, pudding, custard, cream pie. Gelatin, fruit, ice, sherbet, frozen ice pops. Ice cream, ice milk without nuts. Plain hard candy, honey, jelly, molasses, syrup, sugar, chocolate syrup, gumdrops, marshmallows. Limit overall sugar intake.  Fats and Oil Margarine, butter, cream, mayonnaise, salad oils, plain salad dressings made from allowed foods. Choose healthy fats such as olive oil, canola oil, and omega-3 fatty acids (such as found in  salmon or tuna) when  possible.  Other Bouillon, broth, or cream soups made from allowed foods. Any strained soup. Casseroles or mixed dishes made with allowed foods. The items listed above may not be a complete list of recommended foods or beverages. Contact your dietitian for more options.  WHAT FOODS ARE NOT RECOMMENDED? Grains All whole wheat and whole grain breads and crackers. Multigrains, rye, bran seeds, nuts, or coconut. Cereals containing whole grains, multigrains, bran, coconut, nuts, raisins. Cooked or dry oatmeal, steel-cut oats. Coarse wheat cereals, granola. Cereals advertised as high fiber. Potato skins. Whole grain pasta, wild or brown rice. Popcorn. Coconut flour. Bran, buckwheat, corn bread, multigrains, rye, wheat germ.  Vegetables Fresh, cooked or canned vegetables, such as artichokes, asparagus, beet greens, broccoli, Brussels sprouts, cabbage, celery, cauliflower, corn, eggplant, kale, legumes or beans, okra, peas, and tomatoes. Avoid large servings of any vegetables, especially raw vegetables.  Fruits Fresh fruits, such as apples with or without skin, berries, cherries, figs, grapes, grapefruit, guavas, kiwis, mangoes, oranges, papayas, pears, persimmons, pineapple, and pomegranate. Prune juice and juices with pulp, stewed or dried prunes. Dried fruits, dates, raisins. Fruit seeds or skins. Avoid large servings of all fresh fruits. Meats and Other Protein Sources Tough, fibrous meats with gristle. Chunky nut butter. Cheese made with seeds, nuts, or other foods not recommended. Nuts, seeds, legumes (beans, including baked beans), dried peas, beans, lentils.  Dairy Yogurt or cheese that contains nuts, seeds, or added fruit.  Beverages Fruit juices with high pulp, prune juice. Caffeinated coffee and teas.  Condiments Coconut, maple syrup, pickles, olives. Sweets and Desserts Desserts, cookies, or candies that contain nuts or coconut, chunky peanut butter, dried fruits. Jams, preserves with seeds,  marmalade. Large amounts of sugar and sweets. Any other dessert made with fruits from the not recommended list.  Other Soups made from vegetables that are not recommended or that contain other foods not recommended.  The items listed above may not be a complete list of foods and beverages to avoid. Contact your dietitian for more information. Document Released: 09/25/2001 Document Revised: 04/10/2013 Document Reviewed: 02/26/2013 [facility] Patient Information 2015 [place], [place]. This information is not intended to replace advice given to you by your health care provider. Make sure you discuss any questions you have with your health care provider.

## 2014-12-27 NOTE — Progress Notes (Signed)
Subjective: Patient feels better.  States pain is gone.  May feel a little bloated.  Beginning to get hungry.  Passing flatus and a few small loose stools. Creatinine has now normalized  Objective: Vital signs in last 24 hours: Temp:  [98 F (36.7 C)-99.4 F (37.4 C)] 98.4 F (36.9 C) (09/09 0430) Pulse Rate:  [70-78] 70 (09/09 0430) Resp:  [16-20] 20 (09/09 0430) BP: (111-129)/(66-85) 126/66 mmHg (09/09 0430) SpO2:  [97 %-99 %] 97 % (09/09 0430) Last BM Date: 12/26/14  Intake/Output from previous day: 09/08 0701 - 09/09 0700 In: 4162.1 [P.O.:360; I.V.:3802.1] Out: 4 [Urine:4] Intake/Output this shift:    General appearance: Pleasant.  Cooperative.  No distress. GI: Abdomen is soft and essentially nontender.  Normoactive bowel sounds.  Minimally tympanitic  Lab Results:   Recent Labs  12/25/14 1120 12/26/14 0443  WBC 14.7* 9.2  HGB 13.8 12.0*  HCT 39.9 35.3*  PLT 305 250   BMET  Recent Labs  12/26/14 0443 12/27/14 0425  NA 135 136  K 3.7 3.8  CL 99* 104  CO2 25 24  GLUCOSE 107* 109*  BUN 60* 18  CREATININE 1.87* 0.85  CALCIUM 8.8* 8.6*   PT/INR No results for input(s): LABPROT, INR in the last 72 hours. ABG No results for input(s): PHART, HCO3 in the last 72 hours.  Invalid input(s): PCO2, PO2  Studies/Results: Ct Abdomen Pelvis Wo Contrast  12/25/2014   CLINICAL DATA:  Low abdomen pain since Sunday.  EXAM: CT ABDOMEN AND PELVIS WITHOUT CONTRAST  TECHNIQUE: Multidetector CT imaging of the abdomen and pelvis was performed following the standard protocol without IV contrast.  COMPARISON:  None.  FINDINGS: There are multiple dilated proximal and mid small bowel loops with relative sparing of ileal small bowel loops and transition in probably an ileal loop in the right lower quadrant. The colon is normal. The appendix is normal.  The liver, spleen, pancreas, gallbladder, adrenal glands are normal. There is no hydronephrosis bilaterally. No nephrolithiasis  is identified bilaterally. There is a midpole right kidney cyst measuring 2.1 cm. There is atherosclerosis of the aorta without aneurysmal dilatation. There is no abdominal lymphadenopathy.  Fluid-filled bladder is normal. Pelvic phleboliths are identified. Prostate gland is normal.  Minimal dependent atelectasis of the posterior lung bases are identified. Degenerative joint changes of the spine are noted.  IMPRESSION: Multiple dilated small bowel loops with relative sparing of ileal small bowel loops in transition in probably an ileal loop in the right lower quadrant. Differential diagnosis includes early or partial small bowel obstruction. Followup is recommended.   Electronically Signed   By: Abelardo Diesel M.D.   On: 12/25/2014 13:34   Dg Abd 2 Views  12/26/2014   CLINICAL DATA:  Followup small bowel obstruction  EXAM: ABDOMEN - 2 VIEW  COMPARISON:  12/25/2014  FINDINGS: Scattered large and small bowel gas is noted. Few air-fluid levels are noted within the small bowel with evidence of mild dilatation. The overall appearance is stable from the prior exam. No free air is seen. No abnormal mass lesion is noted. Degenerative changes of the lumbar spine are seen.  IMPRESSION: Stable small bowel dilatation with air-fluid levels. No free air is seen. Given the amount of colonic gas is likely represents a partial small bowel obstruction.   Electronically Signed   By: Inez Catalina M.D.   On: 12/26/2014 09:02    Anti-infectives: Anti-infectives    None      Assessment/Plan:  Partial small bowel obstruction  versus motility disorder.  Resolving Advance to soft diet Possibly home this afternoon Follow-up with [facility] if necessary  Severe dehydration with acute renal failure, resolved History of hypertension Chronic back pain GERD   LOS: 2 days    Hussam Muniz M 12/27/2014

## 2014-12-30 DIAGNOSIS — K5669 Other intestinal obstruction: Secondary | ICD-10-CM | POA: Diagnosis not present

## 2014-12-30 DIAGNOSIS — M1990 Unspecified osteoarthritis, unspecified site: Secondary | ICD-10-CM | POA: Diagnosis not present

## 2014-12-30 DIAGNOSIS — N179 Acute kidney failure, unspecified: Secondary | ICD-10-CM | POA: Diagnosis not present

## 2014-12-30 DIAGNOSIS — I10 Essential (primary) hypertension: Secondary | ICD-10-CM | POA: Diagnosis not present

## 2014-12-30 DIAGNOSIS — Z23 Encounter for immunization: Secondary | ICD-10-CM | POA: Diagnosis not present

## 2015-01-09 DIAGNOSIS — I10 Essential (primary) hypertension: Secondary | ICD-10-CM | POA: Diagnosis not present

## 2015-01-09 DIAGNOSIS — N401 Enlarged prostate with lower urinary tract symptoms: Secondary | ICD-10-CM | POA: Diagnosis not present

## 2015-01-09 DIAGNOSIS — M1990 Unspecified osteoarthritis, unspecified site: Secondary | ICD-10-CM | POA: Diagnosis not present

## 2015-04-08 DIAGNOSIS — M47816 Spondylosis without myelopathy or radiculopathy, lumbar region: Secondary | ICD-10-CM | POA: Diagnosis not present

## 2015-04-08 DIAGNOSIS — M545 Low back pain: Secondary | ICD-10-CM | POA: Diagnosis not present

## 2015-04-09 DIAGNOSIS — M47816 Spondylosis without myelopathy or radiculopathy, lumbar region: Secondary | ICD-10-CM | POA: Diagnosis not present

## 2015-05-23 DIAGNOSIS — M1990 Unspecified osteoarthritis, unspecified site: Secondary | ICD-10-CM | POA: Diagnosis not present

## 2015-05-23 DIAGNOSIS — M542 Cervicalgia: Secondary | ICD-10-CM | POA: Diagnosis not present

## 2015-05-23 DIAGNOSIS — M549 Dorsalgia, unspecified: Secondary | ICD-10-CM | POA: Diagnosis not present

## 2015-05-26 DIAGNOSIS — Z961 Presence of intraocular lens: Secondary | ICD-10-CM | POA: Diagnosis not present

## 2015-05-26 DIAGNOSIS — H2512 Age-related nuclear cataract, left eye: Secondary | ICD-10-CM | POA: Diagnosis not present

## 2015-06-03 DIAGNOSIS — M542 Cervicalgia: Secondary | ICD-10-CM | POA: Diagnosis not present

## 2015-06-03 DIAGNOSIS — M4802 Spinal stenosis, cervical region: Secondary | ICD-10-CM | POA: Diagnosis not present

## 2015-06-03 DIAGNOSIS — M5021 Other cervical disc displacement,  high cervical region: Secondary | ICD-10-CM | POA: Diagnosis not present

## 2015-06-03 DIAGNOSIS — M545 Low back pain: Secondary | ICD-10-CM | POA: Diagnosis not present

## 2015-06-03 DIAGNOSIS — M47816 Spondylosis without myelopathy or radiculopathy, lumbar region: Secondary | ICD-10-CM | POA: Diagnosis not present

## 2015-06-03 DIAGNOSIS — M47812 Spondylosis without myelopathy or radiculopathy, cervical region: Secondary | ICD-10-CM | POA: Diagnosis not present

## 2015-06-03 DIAGNOSIS — M47817 Spondylosis without myelopathy or radiculopathy, lumbosacral region: Secondary | ICD-10-CM | POA: Diagnosis not present

## 2015-06-03 DIAGNOSIS — M4806 Spinal stenosis, lumbar region: Secondary | ICD-10-CM | POA: Diagnosis not present

## 2015-06-03 DIAGNOSIS — M5126 Other intervertebral disc displacement, lumbar region: Secondary | ICD-10-CM | POA: Diagnosis not present

## 2015-06-03 DIAGNOSIS — M4312 Spondylolisthesis, cervical region: Secondary | ICD-10-CM | POA: Diagnosis not present

## 2015-06-26 DIAGNOSIS — D1801 Hemangioma of skin and subcutaneous tissue: Secondary | ICD-10-CM | POA: Diagnosis not present

## 2015-06-26 DIAGNOSIS — D485 Neoplasm of uncertain behavior of skin: Secondary | ICD-10-CM | POA: Diagnosis not present

## 2015-06-26 DIAGNOSIS — L821 Other seborrheic keratosis: Secondary | ICD-10-CM | POA: Diagnosis not present

## 2015-06-26 DIAGNOSIS — L570 Actinic keratosis: Secondary | ICD-10-CM | POA: Diagnosis not present

## 2015-06-30 DIAGNOSIS — N400 Enlarged prostate without lower urinary tract symptoms: Secondary | ICD-10-CM | POA: Diagnosis not present

## 2015-06-30 DIAGNOSIS — Z0000 Encounter for general adult medical examination without abnormal findings: Secondary | ICD-10-CM | POA: Diagnosis not present

## 2015-06-30 DIAGNOSIS — M1990 Unspecified osteoarthritis, unspecified site: Secondary | ICD-10-CM | POA: Diagnosis not present

## 2015-06-30 DIAGNOSIS — I10 Essential (primary) hypertension: Secondary | ICD-10-CM | POA: Diagnosis not present

## 2015-06-30 DIAGNOSIS — K219 Gastro-esophageal reflux disease without esophagitis: Secondary | ICD-10-CM | POA: Diagnosis not present

## 2015-06-30 DIAGNOSIS — E785 Hyperlipidemia, unspecified: Secondary | ICD-10-CM | POA: Diagnosis not present

## 2015-07-09 DIAGNOSIS — L570 Actinic keratosis: Secondary | ICD-10-CM | POA: Diagnosis not present

## 2015-10-03 DIAGNOSIS — M5135 Other intervertebral disc degeneration, thoracolumbar region: Secondary | ICD-10-CM | POA: Diagnosis not present

## 2015-10-03 DIAGNOSIS — M5406 Panniculitis affecting regions of neck and back, lumbar region: Secondary | ICD-10-CM | POA: Diagnosis not present

## 2015-10-03 DIAGNOSIS — M545 Low back pain: Secondary | ICD-10-CM | POA: Diagnosis not present

## 2015-11-03 DIAGNOSIS — M47816 Spondylosis without myelopathy or radiculopathy, lumbar region: Secondary | ICD-10-CM | POA: Diagnosis not present

## 2015-11-05 DIAGNOSIS — M47816 Spondylosis without myelopathy or radiculopathy, lumbar region: Secondary | ICD-10-CM | POA: Diagnosis not present

## 2015-11-10 DIAGNOSIS — M47816 Spondylosis without myelopathy or radiculopathy, lumbar region: Secondary | ICD-10-CM | POA: Diagnosis not present

## 2015-11-12 DIAGNOSIS — M47816 Spondylosis without myelopathy or radiculopathy, lumbar region: Secondary | ICD-10-CM | POA: Diagnosis not present

## 2015-11-26 DIAGNOSIS — M47816 Spondylosis without myelopathy or radiculopathy, lumbar region: Secondary | ICD-10-CM | POA: Diagnosis not present

## 2015-11-28 DIAGNOSIS — M47816 Spondylosis without myelopathy or radiculopathy, lumbar region: Secondary | ICD-10-CM | POA: Diagnosis not present

## 2015-12-01 DIAGNOSIS — M47816 Spondylosis without myelopathy or radiculopathy, lumbar region: Secondary | ICD-10-CM | POA: Diagnosis not present

## 2015-12-05 DIAGNOSIS — M47816 Spondylosis without myelopathy or radiculopathy, lumbar region: Secondary | ICD-10-CM | POA: Diagnosis not present

## 2015-12-08 DIAGNOSIS — M47816 Spondylosis without myelopathy or radiculopathy, lumbar region: Secondary | ICD-10-CM | POA: Diagnosis not present

## 2015-12-24 DIAGNOSIS — M47816 Spondylosis without myelopathy or radiculopathy, lumbar region: Secondary | ICD-10-CM | POA: Diagnosis not present

## 2015-12-29 DIAGNOSIS — Z23 Encounter for immunization: Secondary | ICD-10-CM | POA: Diagnosis not present

## 2015-12-29 DIAGNOSIS — M1990 Unspecified osteoarthritis, unspecified site: Secondary | ICD-10-CM | POA: Diagnosis not present

## 2015-12-29 DIAGNOSIS — N401 Enlarged prostate with lower urinary tract symptoms: Secondary | ICD-10-CM | POA: Diagnosis not present

## 2015-12-29 DIAGNOSIS — K219 Gastro-esophageal reflux disease without esophagitis: Secondary | ICD-10-CM | POA: Diagnosis not present

## 2015-12-29 DIAGNOSIS — I10 Essential (primary) hypertension: Secondary | ICD-10-CM | POA: Diagnosis not present

## 2015-12-29 DIAGNOSIS — E785 Hyperlipidemia, unspecified: Secondary | ICD-10-CM | POA: Diagnosis not present

## 2016-01-06 DIAGNOSIS — F40248 Other situational type phobia: Secondary | ICD-10-CM | POA: Diagnosis not present

## 2016-01-15 DIAGNOSIS — F40248 Other situational type phobia: Secondary | ICD-10-CM | POA: Diagnosis not present

## 2016-01-26 DIAGNOSIS — F40248 Other situational type phobia: Secondary | ICD-10-CM | POA: Diagnosis not present

## 2016-02-19 DIAGNOSIS — R32 Unspecified urinary incontinence: Secondary | ICD-10-CM | POA: Diagnosis not present

## 2016-02-19 DIAGNOSIS — R3915 Urgency of urination: Secondary | ICD-10-CM | POA: Diagnosis not present

## 2016-02-19 DIAGNOSIS — R399 Unspecified symptoms and signs involving the genitourinary system: Secondary | ICD-10-CM | POA: Diagnosis not present

## 2016-02-23 DIAGNOSIS — F40248 Other situational type phobia: Secondary | ICD-10-CM | POA: Diagnosis not present

## 2016-03-22 DIAGNOSIS — F40248 Other situational type phobia: Secondary | ICD-10-CM | POA: Diagnosis not present

## 2016-03-25 DIAGNOSIS — R399 Unspecified symptoms and signs involving the genitourinary system: Secondary | ICD-10-CM | POA: Diagnosis not present

## 2016-03-25 DIAGNOSIS — R3915 Urgency of urination: Secondary | ICD-10-CM | POA: Diagnosis not present

## 2016-04-01 DIAGNOSIS — Z205 Contact with and (suspected) exposure to viral hepatitis: Secondary | ICD-10-CM | POA: Diagnosis not present

## 2016-04-01 DIAGNOSIS — Z209 Contact with and (suspected) exposure to unspecified communicable disease: Secondary | ICD-10-CM | POA: Diagnosis not present

## 2016-04-01 DIAGNOSIS — Z202 Contact with and (suspected) exposure to infections with a predominantly sexual mode of transmission: Secondary | ICD-10-CM | POA: Diagnosis not present

## 2016-04-29 DIAGNOSIS — N3941 Urge incontinence: Secondary | ICD-10-CM | POA: Diagnosis not present

## 2016-04-29 DIAGNOSIS — R3915 Urgency of urination: Secondary | ICD-10-CM | POA: Diagnosis not present

## 2016-05-21 DIAGNOSIS — H524 Presbyopia: Secondary | ICD-10-CM | POA: Diagnosis not present

## 2016-05-21 DIAGNOSIS — H2512 Age-related nuclear cataract, left eye: Secondary | ICD-10-CM | POA: Diagnosis not present

## 2016-06-14 DIAGNOSIS — M25561 Pain in right knee: Secondary | ICD-10-CM | POA: Diagnosis not present

## 2016-06-14 DIAGNOSIS — M1711 Unilateral primary osteoarthritis, right knee: Secondary | ICD-10-CM | POA: Diagnosis not present

## 2016-06-24 DIAGNOSIS — L570 Actinic keratosis: Secondary | ICD-10-CM | POA: Diagnosis not present

## 2016-06-24 DIAGNOSIS — L814 Other melanin hyperpigmentation: Secondary | ICD-10-CM | POA: Diagnosis not present

## 2016-06-24 DIAGNOSIS — L820 Inflamed seborrheic keratosis: Secondary | ICD-10-CM | POA: Diagnosis not present

## 2016-06-24 DIAGNOSIS — D1801 Hemangioma of skin and subcutaneous tissue: Secondary | ICD-10-CM | POA: Diagnosis not present

## 2016-07-12 DIAGNOSIS — I10 Essential (primary) hypertension: Secondary | ICD-10-CM | POA: Diagnosis not present

## 2016-07-12 DIAGNOSIS — E785 Hyperlipidemia, unspecified: Secondary | ICD-10-CM | POA: Diagnosis not present

## 2016-07-12 DIAGNOSIS — N401 Enlarged prostate with lower urinary tract symptoms: Secondary | ICD-10-CM | POA: Diagnosis not present

## 2016-07-12 DIAGNOSIS — Z6829 Body mass index (BMI) 29.0-29.9, adult: Secondary | ICD-10-CM | POA: Diagnosis not present

## 2016-07-12 DIAGNOSIS — K219 Gastro-esophageal reflux disease without esophagitis: Secondary | ICD-10-CM | POA: Diagnosis not present

## 2016-07-12 DIAGNOSIS — E663 Overweight: Secondary | ICD-10-CM | POA: Diagnosis not present

## 2016-07-12 DIAGNOSIS — M1990 Unspecified osteoarthritis, unspecified site: Secondary | ICD-10-CM | POA: Diagnosis not present

## 2016-07-30 DIAGNOSIS — M1711 Unilateral primary osteoarthritis, right knee: Secondary | ICD-10-CM | POA: Diagnosis not present

## 2016-07-30 DIAGNOSIS — M25561 Pain in right knee: Secondary | ICD-10-CM | POA: Diagnosis not present

## 2016-08-05 DIAGNOSIS — R350 Frequency of micturition: Secondary | ICD-10-CM | POA: Diagnosis not present

## 2016-08-05 DIAGNOSIS — N401 Enlarged prostate with lower urinary tract symptoms: Secondary | ICD-10-CM | POA: Diagnosis not present

## 2016-08-06 DIAGNOSIS — M1711 Unilateral primary osteoarthritis, right knee: Secondary | ICD-10-CM | POA: Diagnosis not present

## 2016-08-13 DIAGNOSIS — M1711 Unilateral primary osteoarthritis, right knee: Secondary | ICD-10-CM | POA: Diagnosis not present

## 2016-09-28 DIAGNOSIS — M25561 Pain in right knee: Secondary | ICD-10-CM | POA: Diagnosis not present

## 2016-09-28 DIAGNOSIS — M1711 Unilateral primary osteoarthritis, right knee: Secondary | ICD-10-CM | POA: Diagnosis not present

## 2016-12-13 DIAGNOSIS — A549 Gonococcal infection, unspecified: Secondary | ICD-10-CM | POA: Diagnosis not present

## 2016-12-13 DIAGNOSIS — N342 Other urethritis: Secondary | ICD-10-CM | POA: Diagnosis not present

## 2016-12-13 DIAGNOSIS — R369 Urethral discharge, unspecified: Secondary | ICD-10-CM | POA: Diagnosis not present

## 2016-12-16 DIAGNOSIS — A549 Gonococcal infection, unspecified: Secondary | ICD-10-CM | POA: Diagnosis not present

## 2016-12-16 DIAGNOSIS — R369 Urethral discharge, unspecified: Secondary | ICD-10-CM | POA: Diagnosis not present

## 2016-12-16 DIAGNOSIS — N342 Other urethritis: Secondary | ICD-10-CM | POA: Diagnosis not present

## 2016-12-27 DIAGNOSIS — M1990 Unspecified osteoarthritis, unspecified site: Secondary | ICD-10-CM | POA: Diagnosis not present

## 2016-12-27 DIAGNOSIS — I10 Essential (primary) hypertension: Secondary | ICD-10-CM | POA: Diagnosis not present

## 2016-12-27 DIAGNOSIS — Z23 Encounter for immunization: Secondary | ICD-10-CM | POA: Diagnosis not present

## 2016-12-27 DIAGNOSIS — E663 Overweight: Secondary | ICD-10-CM | POA: Diagnosis not present

## 2016-12-27 DIAGNOSIS — N401 Enlarged prostate with lower urinary tract symptoms: Secondary | ICD-10-CM | POA: Diagnosis not present

## 2016-12-27 DIAGNOSIS — E785 Hyperlipidemia, unspecified: Secondary | ICD-10-CM | POA: Diagnosis not present

## 2016-12-27 DIAGNOSIS — Z0000 Encounter for general adult medical examination without abnormal findings: Secondary | ICD-10-CM | POA: Diagnosis not present

## 2016-12-27 DIAGNOSIS — Z125 Encounter for screening for malignant neoplasm of prostate: Secondary | ICD-10-CM | POA: Diagnosis not present

## 2016-12-27 DIAGNOSIS — Z6829 Body mass index (BMI) 29.0-29.9, adult: Secondary | ICD-10-CM | POA: Diagnosis not present

## 2016-12-27 DIAGNOSIS — K219 Gastro-esophageal reflux disease without esophagitis: Secondary | ICD-10-CM | POA: Diagnosis not present

## 2017-02-03 DIAGNOSIS — N401 Enlarged prostate with lower urinary tract symptoms: Secondary | ICD-10-CM | POA: Diagnosis not present

## 2017-02-03 DIAGNOSIS — N3941 Urge incontinence: Secondary | ICD-10-CM | POA: Diagnosis not present

## 2017-02-03 DIAGNOSIS — R3915 Urgency of urination: Secondary | ICD-10-CM | POA: Diagnosis not present

## 2017-02-03 DIAGNOSIS — R350 Frequency of micturition: Secondary | ICD-10-CM | POA: Diagnosis not present

## 2017-03-23 DIAGNOSIS — N492 Inflammatory disorders of scrotum: Secondary | ICD-10-CM | POA: Diagnosis not present

## 2017-05-17 DIAGNOSIS — L732 Hidradenitis suppurativa: Secondary | ICD-10-CM | POA: Diagnosis not present

## 2017-05-17 DIAGNOSIS — L723 Sebaceous cyst: Secondary | ICD-10-CM | POA: Diagnosis not present

## 2017-07-04 DIAGNOSIS — K219 Gastro-esophageal reflux disease without esophagitis: Secondary | ICD-10-CM | POA: Diagnosis not present

## 2017-07-04 DIAGNOSIS — E663 Overweight: Secondary | ICD-10-CM | POA: Diagnosis not present

## 2017-07-04 DIAGNOSIS — M1990 Unspecified osteoarthritis, unspecified site: Secondary | ICD-10-CM | POA: Diagnosis not present

## 2017-07-04 DIAGNOSIS — N401 Enlarged prostate with lower urinary tract symptoms: Secondary | ICD-10-CM | POA: Diagnosis not present

## 2017-07-04 DIAGNOSIS — I10 Essential (primary) hypertension: Secondary | ICD-10-CM | POA: Diagnosis not present

## 2017-07-04 DIAGNOSIS — E785 Hyperlipidemia, unspecified: Secondary | ICD-10-CM | POA: Diagnosis not present

## 2017-07-04 DIAGNOSIS — Z1211 Encounter for screening for malignant neoplasm of colon: Secondary | ICD-10-CM | POA: Diagnosis not present

## 2017-07-06 DIAGNOSIS — L03019 Cellulitis of unspecified finger: Secondary | ICD-10-CM | POA: Diagnosis not present

## 2017-07-06 DIAGNOSIS — S60449A External constriction of unspecified finger, initial encounter: Secondary | ICD-10-CM | POA: Diagnosis not present

## 2017-07-11 DIAGNOSIS — N401 Enlarged prostate with lower urinary tract symptoms: Secondary | ICD-10-CM | POA: Diagnosis not present

## 2017-07-13 DIAGNOSIS — S60449D External constriction of unspecified finger, subsequent encounter: Secondary | ICD-10-CM | POA: Diagnosis not present

## 2017-07-13 DIAGNOSIS — L03019 Cellulitis of unspecified finger: Secondary | ICD-10-CM | POA: Diagnosis not present

## 2017-08-25 DIAGNOSIS — N320 Bladder-neck obstruction: Secondary | ICD-10-CM | POA: Diagnosis not present

## 2017-08-25 DIAGNOSIS — N35912 Unspecified bulbous urethral stricture, male: Secondary | ICD-10-CM | POA: Diagnosis not present

## 2017-08-25 DIAGNOSIS — N401 Enlarged prostate with lower urinary tract symptoms: Secondary | ICD-10-CM | POA: Diagnosis not present

## 2017-08-25 DIAGNOSIS — N138 Other obstructive and reflux uropathy: Secondary | ICD-10-CM | POA: Diagnosis not present

## 2017-09-06 DIAGNOSIS — H5211 Myopia, right eye: Secondary | ICD-10-CM | POA: Diagnosis not present

## 2017-09-06 DIAGNOSIS — E119 Type 2 diabetes mellitus without complications: Secondary | ICD-10-CM | POA: Diagnosis not present

## 2017-09-06 DIAGNOSIS — H2512 Age-related nuclear cataract, left eye: Secondary | ICD-10-CM | POA: Diagnosis not present

## 2017-09-25 DIAGNOSIS — L03116 Cellulitis of left lower limb: Secondary | ICD-10-CM | POA: Diagnosis not present

## 2017-09-26 DIAGNOSIS — Z1211 Encounter for screening for malignant neoplasm of colon: Secondary | ICD-10-CM | POA: Diagnosis not present

## 2017-09-26 DIAGNOSIS — K5730 Diverticulosis of large intestine without perforation or abscess without bleeding: Secondary | ICD-10-CM | POA: Diagnosis not present

## 2017-10-05 DIAGNOSIS — Z87448 Personal history of other diseases of urinary system: Secondary | ICD-10-CM | POA: Diagnosis not present

## 2017-10-05 DIAGNOSIS — N3001 Acute cystitis with hematuria: Secondary | ICD-10-CM | POA: Diagnosis not present

## 2017-10-14 DIAGNOSIS — N35919 Unspecified urethral stricture, male, unspecified site: Secondary | ICD-10-CM | POA: Diagnosis not present

## 2017-10-14 DIAGNOSIS — Z302 Encounter for sterilization: Secondary | ICD-10-CM | POA: Diagnosis not present

## 2017-10-14 DIAGNOSIS — N469 Male infertility, unspecified: Secondary | ICD-10-CM | POA: Diagnosis not present

## 2017-10-14 DIAGNOSIS — I10 Essential (primary) hypertension: Secondary | ICD-10-CM | POA: Diagnosis not present

## 2017-10-14 DIAGNOSIS — Z87891 Personal history of nicotine dependence: Secondary | ICD-10-CM | POA: Diagnosis not present

## 2017-10-14 HISTORY — PX: URETHRAL DILATION: SUR417

## 2017-10-17 DIAGNOSIS — Z87448 Personal history of other diseases of urinary system: Secondary | ICD-10-CM | POA: Diagnosis not present

## 2017-12-23 ENCOUNTER — Ambulatory Visit: Payer: Self-pay | Admitting: General Surgery

## 2017-12-23 DIAGNOSIS — K4091 Unilateral inguinal hernia, without obstruction or gangrene, recurrent: Secondary | ICD-10-CM | POA: Diagnosis not present

## 2017-12-23 NOTE — H&P (Signed)
Johnny Peck Documented: 12/23/2017 10:09 AM Location: [facility] Patient #: [id number] DOB: 11/06/1945 Married / Language: Johnny Peck / Race: White Male  History of Present Illness [name] M. Lizbeth Feijoo MD; 12/23/2017 12:44 PM) The patient is a 72 year old male who presents with inguinal swelling. He comes in with complaints of recurrent discomfort as well as intermittent swelling in his left groin. He underwent laparoscopic repair of a left indirect inguinal hernia with mesh in 2016 by me. 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 He states about a month and have ago he started noticing some discomfort in his left groin. He noticed an intermittent bulge. He felt like a gas sensation in the area. He has had some intermittent burning as well. If he lays flat he is able to push it down and have some flatus and it feels better. It is more noticeable toward the end of the day. He denies any nausea, vomiting, diarrhea or constipation. 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 He states right after that procedure a few months ago he noticed an immediate improvement but now he started to have some recurrent symptoms in the sense that is taking him a while to void but he will eventually empty his bladder. He does not feel like he is not completely emptying his bladder. He plans to follow up with his urologist soon   Problem List/Past Medical [name] M. 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 Open Inguinal Hernia Surgery Left, Right. Tonsillectomy Vasectomy  Diagnostic Studies History [name]; 12/23/2017 10:09 AM) Colonoscopy within last year 5-10 years ago  Allergies [name];  12/23/2017 10:10 AM) No Known Drug Allergies [06/27/2014]: Allergies Reconciled  Medication History [name] M. Redmond Pulling, MD; 12/23/2017 12:48 PM) traMADol HCl (50MG  Tablet, Oral) Active. Crestor (10MG  Tablet, Oral) Active. Omeprazole (10MG  Capsule DR, Oral) Active. Meloxicam (15MG  Tablet, Oral) Active. Metoprolol Succinate ER (50MG  Tablet ER 24HR, Oral) Active. Cialis (2.5MG  Tablet, Oral) Active. CoQ10 (100MG  Capsule, Oral) Active. Multi Vitamin (Oral) Active. Myrbetriq (50MG  Tablet ER 24HR, Oral) Active. Tamsulosin HCl (0.4MG  Capsule, Oral) Active. Medications Reconciled Lisinopril (10MG  Tablet, Oral) Active. Mobic (15MG  Tablet, Oral) Active. Flomax (0.4MG  Capsule ER 24HR, Oral) Active.  Social History [name]; 12/23/2017 10:09 AM) Alcohol use Moderate alcohol use, Occasional alcohol use. Caffeine use Carbonated beverages, Tea. Illicit drug use Prefer to discuss with provider. Tobacco use Current some day smoker, Former smoker.  Family History [name] M. Redmond Pulling, MD; 12/23/2017 12:48 PM) Alcohol Abuse Daughter, Family Members In General. Anesthetic complications Daughter, Family Members In General. Arthritis Family Members In General, Father, Mother. Bleeding disorder Daughter, Family Members In General. Breast Cancer Family Members In General. Cervical Cancer Family Members In General. Colon Cancer Family Members In General. Colon Polyps Family Members In General. Depression Family Members In General. Diabetes Mellitus Family Members In General. Heart Disease Family Members In General, Father, Mother. Hypertension Daughter, Family Members In General, Father, Mother. Kidney Disease Daughter, Family Members In [place], Mother, Son. Malignant Neoplasm Of Pancreas Family Members In General. Migraine Headache Family Members In General. Ovarian Cancer Family Members In General. Prostate Cancer Father. Rectal Cancer Family Members In  General. Respiratory Condition Daughter, Family Members In [place], Son. Arthritis Father, Mother.  Other Problems [name] M. Redmond Pulling, MD; 12/23/2017  12:48 PM) Arthritis Asthma Back Pain Bladder Problems Chronic Renal Failure Syndrome Congestive Heart Failure Gastroesophageal Reflux Disease Hemorrhoids Hepatitis High blood pressure Home Oxygen Use Hypercholesterolemia Inguinal Hernia Migraine Headache Transfusion history Umbilical Hernia Repair Other disease, cancer, significant illness     Review of Systems [name] M. Genever Hentges MD; 12/23/2017 12:44 PM) General Not Present- Appetite Loss, Chills, Fatigue, Fever, Night Sweats, Weight Gain and Weight Loss. Skin Not Present- Change in Wart/Mole, Dryness, Hives, Jaundice, New Lesions, Non-Healing Wounds, Rash and Ulcer. HEENT Not Present- Earache, Hearing Loss, Hoarseness, Nose Bleed, Oral Ulcers, Ringing in the Ears, Seasonal Allergies, Sinus Pain, Sore Throat, Visual Disturbances, Wears glasses/contact lenses and Yellow Eyes. Respiratory Not Present- Bloody sputum, Chronic Cough, Difficulty Breathing, Snoring and Wheezing. Breast Not Present- Breast Mass, Breast Pain, Nipple Discharge and Skin Changes. Cardiovascular Not Present- Chest Pain, Difficulty Breathing Lying Down, Leg Cramps, Palpitations, Rapid Heart Rate, Shortness of Breath and Swelling of Extremities. Gastrointestinal Present- Abdominal Pain. Not Present- Bloating, Bloody Stool, Change in Bowel Habits, Chronic diarrhea, Constipation, Difficulty Swallowing, Excessive gas, Gets full quickly at meals, Hemorrhoids, Indigestion, Nausea, Rectal Pain and Vomiting. Male Genitourinary Not Present- Blood in Urine, Change in Urinary Stream, Frequency, Impotence, Nocturia, Painful Urination, Urgency and Urine Leakage. All other systems negative  Vitals [name]; 12/23/2017 10:15 AM) 12/23/2017 10:15 AM Weight: 201 lb Height: 70in Body Surface Area: 2.09 m  Body Mass Index: 28.84 kg/m  Pain Level: 3/10 Temp.: 98.24F(Temporal)  Pulse: 73 (Regular)  P.OX: 94% (Room air) BP: 158/86 (Sitting, Left Arm, Standard)      Physical Exam [name] M. Jentzen Minasyan MD; 12/23/2017 12:45 PM)  General Mental Status-Alert. General Appearance-Consistent with stated age. Hydration-Well hydrated. Voice-Normal.  Head and Neck Head-normocephalic, atraumatic with no lesions or palpable masses. Trachea-midline. Thyroid Gland Characteristics - normal size and consistency.  Eye Eyeball - Bilateral-Extraocular movements intact. Sclera/Conjunctiva - Bilateral-No scleral icterus.  ENMT Ears -Note:normal external ears.  Mouth and Throat -Note:lips intact.   Chest and Lung Exam Chest and lung exam reveals -quiet, even and easy respiratory effort with no use of accessory muscles and on auscultation, normal breath sounds, no adventitious sounds and normal vocal resonance. Inspection Chest Wall - Normal. Back - normal.  Breast - Did not examine.  Cardiovascular Cardiovascular examination reveals -normal heart sounds, regular rate and rhythm with no murmurs and normal pedal pulses bilaterally.  Abdomen Inspection  Inspection of the abdomen reveals: Note: well healed Rt groin incision and trocar scars No obvious bulge upon standing. Both testicles are descended. No scrotal testicular masses. Palpation of right inguinal canal reveals no bulge with Valsalva. Palpation of the left inguinal canal with valsalva reveals an impulse - small bulge. Skin - Scar - no surgical scars. Palpation/Percussion Palpation and Percussion of the abdomen reveal - Soft, Non Tender, No Rebound tenderness, No Rigidity (guarding) and No hepatosplenomegaly. Auscultation Auscultation of the abdomen reveals - Bowel sounds normal.  Male Genitourinary Note: both testicles down; old right inguinal incision; no obvious bulge supine. pt examined supine and  standing. on standing with valsalva, there is lateral LEFT inguinal bulge. reducible.  Peripheral Vascular Upper Extremity Palpation - Pulses bilaterally normal.  Neurologic Neurologic evaluation reveals -alert and oriented x 3 with no impairment of recent or remote memory. Mental Status-Normal.  Neuropsychiatric The patient's mood and affect are described as -normal. Judgment and Insight-insight is appropriate concerning matters relevant to self.  Musculoskeletal Normal Exam - Left-Upper Extremity Strength Normal and Lower Extremity Strength Normal. Normal Exam - Right-Upper Extremity  Strength Normal and Lower Extremity Strength Normal.  Lymphatic Head & Neck  General Head & Neck Lymphatics: Bilateral - Description - Normal. Axillary - Did not examine. Femoral & Inguinal - Did not examine.    Assessment & Plan [name] M. 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 On exam it is not obvious but there is a small bulge with Valsalva. I don't think imaging would be of significant benefit. Since he has had a laparoscopic approach in the past I recommended an open approach  We discussed the etiology of inguinal hernias. We discussed the signs & symptoms of incarceration & strangulation. We discussed non-operative and operative management.  The patient has elected to proceed with open repair of left recurrent inguinal hernia with mesh  I described the procedure in detail. The patient was given educational material. We discussed the risks and benefits including but not limited to bleeding, infection, chronic inguinal pain, nerve entrapment, hernia recurrence, mesh complications, hematoma formation, urinary retention, injury to the testicle, numbness in the groin, blood clots, injury to the surrounding structures, and  anesthesia risk. We also discussed the typical post operative recovery course, including no heavy lifting for 4-6 weeks. I explained that the likelihood of improvement of their symptoms is good.  I explained that he has a slight increased risk for recurrence, injury to surrounding structures since this is potentially a recurrent hernia.  We also discussed the possibility of urinary retention postoperatively. Since he has had a urethral stricture that has required surgery (if he were to go into retention after this hernia surgery) then we may have to get urology involved. He voiced understanding.  Current Plans Pt Education - Pamphlet Given - Hernia Surgery: discussed with patient and provided information.  [name]. Redmond Pulling, MD, FACS General, Bariatric, & Minimally Invasive Surgery [facility], [place]

## 2018-01-17 DIAGNOSIS — D485 Neoplasm of uncertain behavior of skin: Secondary | ICD-10-CM | POA: Diagnosis not present

## 2018-01-17 DIAGNOSIS — D1801 Hemangioma of skin and subcutaneous tissue: Secondary | ICD-10-CM | POA: Diagnosis not present

## 2018-01-17 DIAGNOSIS — L814 Other melanin hyperpigmentation: Secondary | ICD-10-CM | POA: Diagnosis not present

## 2018-01-17 DIAGNOSIS — D229 Melanocytic nevi, unspecified: Secondary | ICD-10-CM | POA: Diagnosis not present

## 2018-01-17 DIAGNOSIS — D2339 Other benign neoplasm of skin of other parts of face: Secondary | ICD-10-CM | POA: Diagnosis not present

## 2018-01-23 DIAGNOSIS — Z6829 Body mass index (BMI) 29.0-29.9, adult: Secondary | ICD-10-CM | POA: Diagnosis not present

## 2018-01-23 DIAGNOSIS — N401 Enlarged prostate with lower urinary tract symptoms: Secondary | ICD-10-CM | POA: Diagnosis not present

## 2018-01-23 DIAGNOSIS — Z1389 Encounter for screening for other disorder: Secondary | ICD-10-CM | POA: Diagnosis not present

## 2018-01-23 DIAGNOSIS — E785 Hyperlipidemia, unspecified: Secondary | ICD-10-CM | POA: Diagnosis not present

## 2018-01-23 DIAGNOSIS — Z0000 Encounter for general adult medical examination without abnormal findings: Secondary | ICD-10-CM | POA: Diagnosis not present

## 2018-01-23 DIAGNOSIS — Z23 Encounter for immunization: Secondary | ICD-10-CM | POA: Diagnosis not present

## 2018-01-23 DIAGNOSIS — I10 Essential (primary) hypertension: Secondary | ICD-10-CM | POA: Diagnosis not present

## 2018-01-23 DIAGNOSIS — K219 Gastro-esophageal reflux disease without esophagitis: Secondary | ICD-10-CM | POA: Diagnosis not present

## 2018-01-23 DIAGNOSIS — M1990 Unspecified osteoarthritis, unspecified site: Secondary | ICD-10-CM | POA: Diagnosis not present

## 2018-01-23 DIAGNOSIS — E663 Overweight: Secondary | ICD-10-CM | POA: Diagnosis not present

## 2018-02-10 NOTE — Patient Instructions (Signed)
Johnny Peck  02/10/2018   Your procedure is scheduled on: 02-16-18   Report to [facility] Main  Entrance    Report to admitting at 1:00PM    Call this number if you have problems the morning of surgery [phone]     Remember: NO SOLID FOOD AFTER MIDNIGHT THE NIGHT PRIOR TO SURGERY. NOTHING BY MOUTH EXCEPT CLEAR LIQUIDS UNTIL 3 HOURS PRIOR TO [place] SURGERY. PLEASE FINISH ENSURE DRINK PER SURGEON ORDER 3 HOURS PRIOR TO SCHEDULED SURGERY TIME WHICH NEEDS TO BE COMPLETED AT ____12:00PM_____.     CLEAR LIQUID DIET   Foods Allowed                                                                     Foods Excluded  Coffee and tea, regular and decaf                             liquids that you cannot  Plain Jell-O in any flavor                                             see through such as: Fruit ices (not with fruit pulp)                                     milk, soups, orange juice  Iced Popsicles                                    All solid food Carbonated beverages, regular and diet                                    Cranberry, grape and apple juices Sports drinks like Gatorade Lightly seasoned clear broth or consume(fat free) Sugar, honey syrup  Sample Menu Breakfast                                Lunch                                     Supper Cranberry juice                    Beef broth                            Chicken broth Jell-O                                     Grape juice  Apple juice Coffee or tea                        Jell-O                                      Popsicle                                                Coffee or tea                        Coffee or tea  _____________________________________________________________________      Take these medicines the morning of surgery with A SIP OF WATER: metoprolol, omeprazole                                 You may not have any metal on your body  including hair pins and              piercings  Do not wear jewelry, make-up, lotions, powders or perfumes, deodorant                    Men may shave face and neck.   Do not bring valuables to the hospital. [place].  Contacts, dentures or bridgework may not be worn into surgery.      Patients discharged the day of surgery will not be allowed to drive home.  Name and phone number of your driver:  Special Instructions: N/A              Please read over the following fact sheets you were given: _____________________________________________________________________             [facility] - Preparing for Surgery Before surgery, you can play an important role.  Because skin is not sterile, your skin needs to be as free of germs as possible.  You can reduce the number of germs on your skin by washing with CHG (chlorahexidine gluconate) soap before surgery.  CHG is an antiseptic cleaner which kills germs and bonds with the skin to continue killing germs even after washing. Please DO NOT use if you have an allergy to CHG or antibacterial soaps.  If your skin becomes reddened/irritated stop using the CHG and inform your nurse when you arrive at Short Stay. Do not shave (including legs and underarms) for at least 48 hours prior to the first CHG shower.  You may shave your face/neck. Please follow these instructions carefully:  1.  Shower with CHG Soap the night before surgery and the  morning of Surgery.  2.  If you choose to wash your hair, wash your hair first as usual with your  normal  shampoo.  3.  After you shampoo, rinse your hair and body thoroughly to remove the  shampoo.                           4.  Use CHG as you would any other liquid soap.  You can apply chg  directly  to the skin and wash                       Gently with a scrungie or clean washcloth.  5.  Apply the CHG Soap to your body ONLY FROM THE NECK DOWN.   Do not use on  face/ open                           Wound or open sores. Avoid contact with eyes, ears mouth and genitals (private parts).                       Wash face,  Genitals (private parts) with your normal soap.             6.  Wash thoroughly, paying special attention to the area where your surgery  will be performed.  7.  Thoroughly rinse your body with warm water from the neck down.  8.  DO NOT shower/wash with your normal soap after using and rinsing off  the CHG Soap.                9.  Pat yourself dry with a clean towel.            10.  Wear clean pajamas.            11.  Place clean sheets on your bed the night of your first shower and do not  sleep with pets. Day of Surgery : Do not apply any lotions/deodorants the morning of surgery.  Please wear clean clothes to the hospital/surgery center.  FAILURE TO FOLLOW THESE INSTRUCTIONS MAY RESULT IN THE CANCELLATION OF YOUR SURGERY PATIENT SIGNATURE_________________________________  NURSE SIGNATURE__________________________________  ________________________________________________________________________

## 2018-02-13 ENCOUNTER — Encounter (HOSPITAL_COMMUNITY): Payer: Self-pay

## 2018-02-13 ENCOUNTER — Other Ambulatory Visit: Payer: Self-pay

## 2018-02-13 ENCOUNTER — Encounter (HOSPITAL_COMMUNITY)
Admission: RE | Admit: 2018-02-13 | Discharge: 2018-02-13 | Disposition: A | Discharge status: Home / Self Care | Payer: Medicare Other | Source: Ambulatory Visit | Attending: General Surgery | Admitting: General Surgery

## 2018-02-13 DIAGNOSIS — K649 Unspecified hemorrhoids: Secondary | ICD-10-CM | POA: Diagnosis not present

## 2018-02-13 DIAGNOSIS — K4091 Unilateral inguinal hernia, without obstruction or gangrene, recurrent: Secondary | ICD-10-CM | POA: Diagnosis not present

## 2018-02-13 DIAGNOSIS — Z8041 Family history of malignant neoplasm of ovary: Secondary | ICD-10-CM | POA: Diagnosis not present

## 2018-02-13 DIAGNOSIS — Z803 Family history of malignant neoplasm of breast: Secondary | ICD-10-CM | POA: Diagnosis not present

## 2018-02-13 DIAGNOSIS — N189 Chronic kidney disease, unspecified: Secondary | ICD-10-CM | POA: Diagnosis not present

## 2018-02-13 DIAGNOSIS — E7800 Pure hypercholesterolemia, unspecified: Secondary | ICD-10-CM | POA: Diagnosis not present

## 2018-02-13 DIAGNOSIS — Z8489 Family history of other specified conditions: Secondary | ICD-10-CM | POA: Diagnosis not present

## 2018-02-13 DIAGNOSIS — Z800 Family history of malignant neoplasm of digestive organs: Secondary | ICD-10-CM | POA: Diagnosis not present

## 2018-02-13 DIAGNOSIS — Z832 Family history of diseases of the blood and blood-forming organs and certain disorders involving the immune mechanism: Secondary | ICD-10-CM | POA: Diagnosis not present

## 2018-02-13 DIAGNOSIS — Z79899 Other long term (current) drug therapy: Secondary | ICD-10-CM | POA: Diagnosis not present

## 2018-02-13 DIAGNOSIS — Z8042 Family history of malignant neoplasm of prostate: Secondary | ICD-10-CM | POA: Diagnosis not present

## 2018-02-13 DIAGNOSIS — Z9981 Dependence on supplemental oxygen: Secondary | ICD-10-CM | POA: Diagnosis not present

## 2018-02-13 DIAGNOSIS — I130 Hypertensive heart and chronic kidney disease with heart failure and stage 1 through stage 4 chronic kidney disease, or unspecified chronic kidney disease: Secondary | ICD-10-CM | POA: Diagnosis not present

## 2018-02-13 DIAGNOSIS — K219 Gastro-esophageal reflux disease without esophagitis: Secondary | ICD-10-CM | POA: Diagnosis not present

## 2018-02-13 DIAGNOSIS — G43909 Migraine, unspecified, not intractable, without status migrainosus: Secondary | ICD-10-CM | POA: Diagnosis not present

## 2018-02-13 DIAGNOSIS — Z836 Family history of other diseases of the respiratory system: Secondary | ICD-10-CM | POA: Diagnosis not present

## 2018-02-13 DIAGNOSIS — I509 Heart failure, unspecified: Secondary | ICD-10-CM | POA: Diagnosis not present

## 2018-02-13 DIAGNOSIS — M1990 Unspecified osteoarthritis, unspecified site: Secondary | ICD-10-CM | POA: Diagnosis not present

## 2018-02-13 DIAGNOSIS — Z841 Family history of disorders of kidney and ureter: Secondary | ICD-10-CM | POA: Diagnosis not present

## 2018-02-13 DIAGNOSIS — Z87891 Personal history of nicotine dependence: Secondary | ICD-10-CM | POA: Diagnosis not present

## 2018-02-13 DIAGNOSIS — J45909 Unspecified asthma, uncomplicated: Secondary | ICD-10-CM | POA: Diagnosis not present

## 2018-02-13 DIAGNOSIS — Z811 Family history of alcohol abuse and dependence: Secondary | ICD-10-CM | POA: Diagnosis not present

## 2018-02-13 DIAGNOSIS — Z01812 Encounter for preprocedural laboratory examination: Secondary | ICD-10-CM

## 2018-02-13 DIAGNOSIS — Z8261 Family history of arthritis: Secondary | ICD-10-CM | POA: Diagnosis not present

## 2018-02-13 DIAGNOSIS — M549 Dorsalgia, unspecified: Secondary | ICD-10-CM | POA: Diagnosis not present

## 2018-02-13 LAB — CBC
HCT: 40.5 % (ref 39.0–52.0)
Hemoglobin: 13.4 g/dL (ref 13.0–17.0)
MCH: 31.5 pg (ref 26.0–34.0)
MCHC: 33.1 g/dL (ref 30.0–36.0)
MCV: 95.3 fL (ref 80.0–100.0)
Platelets: 187 10*3/uL (ref 150–400)
RBC: 4.25 MIL/uL (ref 4.22–5.81)
RDW: 13.5 % (ref 11.5–15.5)
WBC: 7.5 10*3/uL (ref 4.0–10.5)
nRBC: 0 % (ref 0.0–0.2)

## 2018-02-13 LAB — BASIC METABOLIC PANEL
Anion gap: 7 (ref 5–15)
BUN: 17 mg/dL (ref 8–23)
CO2: 26 mmol/L (ref 22–32)
CREATININE: 0.96 mg/dL (ref 0.61–1.24)
Calcium: 9.2 mg/dL (ref 8.9–10.3)
Chloride: 106 mmol/L (ref 98–111)
GFR calc Af Amer: >60 mL/min (ref >60)
Glucose, Bld: 95 mg/dL (ref 70–99)
Potassium: 4.1 mmol/L (ref 3.5–5.1)
SODIUM: 139 mmol/L (ref 135–145)

## 2018-02-13 NOTE — Progress Notes (Signed)
EKG REQUESTED [place]

## 2018-02-14 DIAGNOSIS — Z0000 Encounter for general adult medical examination without abnormal findings: Secondary | ICD-10-CM | POA: Diagnosis not present

## 2018-02-14 DIAGNOSIS — M1990 Unspecified osteoarthritis, unspecified site: Secondary | ICD-10-CM | POA: Diagnosis not present

## 2018-02-14 DIAGNOSIS — K219 Gastro-esophageal reflux disease without esophagitis: Secondary | ICD-10-CM | POA: Diagnosis not present

## 2018-02-14 DIAGNOSIS — E785 Hyperlipidemia, unspecified: Secondary | ICD-10-CM | POA: Diagnosis not present

## 2018-02-14 DIAGNOSIS — E663 Overweight: Secondary | ICD-10-CM | POA: Diagnosis not present

## 2018-02-14 DIAGNOSIS — Z6829 Body mass index (BMI) 29.0-29.9, adult: Secondary | ICD-10-CM | POA: Diagnosis not present

## 2018-02-14 DIAGNOSIS — N401 Enlarged prostate with lower urinary tract symptoms: Secondary | ICD-10-CM | POA: Diagnosis not present

## 2018-02-14 DIAGNOSIS — Z23 Encounter for immunization: Secondary | ICD-10-CM | POA: Diagnosis not present

## 2018-02-14 DIAGNOSIS — I10 Essential (primary) hypertension: Secondary | ICD-10-CM | POA: Diagnosis not present

## 2018-02-14 DIAGNOSIS — Z1389 Encounter for screening for other disorder: Secondary | ICD-10-CM | POA: Diagnosis not present

## 2018-02-14 NOTE — Progress Notes (Signed)
EKG 10-05-17 on chart

## 2018-02-16 ENCOUNTER — Encounter (HOSPITAL_COMMUNITY)
Admission: RE | Disposition: A | Discharge status: Home / Self Care | Payer: Self-pay | Source: Ambulatory Visit | Attending: General Surgery

## 2018-02-16 ENCOUNTER — Ambulatory Visit (HOSPITAL_COMMUNITY): Payer: Medicare Other | Admitting: Anesthesiology

## 2018-02-16 ENCOUNTER — Ambulatory Visit (HOSPITAL_COMMUNITY)
Admission: RE | Admit: 2018-02-16 | Discharge: 2018-02-16 | Disposition: A | Discharge status: Home / Self Care | Payer: Medicare Other | Source: Ambulatory Visit | Attending: General Surgery | Admitting: General Surgery

## 2018-02-16 ENCOUNTER — Encounter (HOSPITAL_COMMUNITY): Payer: Self-pay

## 2018-02-16 DIAGNOSIS — G43909 Migraine, unspecified, not intractable, without status migrainosus: Secondary | ICD-10-CM | POA: Insufficient documentation

## 2018-02-16 DIAGNOSIS — K219 Gastro-esophageal reflux disease without esophagitis: Secondary | ICD-10-CM | POA: Insufficient documentation

## 2018-02-16 DIAGNOSIS — E7800 Pure hypercholesterolemia, unspecified: Secondary | ICD-10-CM | POA: Insufficient documentation

## 2018-02-16 DIAGNOSIS — K649 Unspecified hemorrhoids: Secondary | ICD-10-CM | POA: Insufficient documentation

## 2018-02-16 DIAGNOSIS — Z841 Family history of disorders of kidney and ureter: Secondary | ICD-10-CM | POA: Insufficient documentation

## 2018-02-16 DIAGNOSIS — Z87891 Personal history of nicotine dependence: Secondary | ICD-10-CM | POA: Diagnosis not present

## 2018-02-16 DIAGNOSIS — K4091 Unilateral inguinal hernia, without obstruction or gangrene, recurrent: Secondary | ICD-10-CM | POA: Diagnosis not present

## 2018-02-16 DIAGNOSIS — M1990 Unspecified osteoarthritis, unspecified site: Secondary | ICD-10-CM | POA: Diagnosis not present

## 2018-02-16 DIAGNOSIS — Z79899 Other long term (current) drug therapy: Secondary | ICD-10-CM | POA: Insufficient documentation

## 2018-02-16 DIAGNOSIS — M549 Dorsalgia, unspecified: Secondary | ICD-10-CM | POA: Insufficient documentation

## 2018-02-16 DIAGNOSIS — N189 Chronic kidney disease, unspecified: Secondary | ICD-10-CM | POA: Insufficient documentation

## 2018-02-16 DIAGNOSIS — Z832 Family history of diseases of the blood and blood-forming organs and certain disorders involving the immune mechanism: Secondary | ICD-10-CM | POA: Insufficient documentation

## 2018-02-16 DIAGNOSIS — Z811 Family history of alcohol abuse and dependence: Secondary | ICD-10-CM | POA: Insufficient documentation

## 2018-02-16 DIAGNOSIS — Z8041 Family history of malignant neoplasm of ovary: Secondary | ICD-10-CM | POA: Insufficient documentation

## 2018-02-16 DIAGNOSIS — G8918 Other acute postprocedural pain: Secondary | ICD-10-CM | POA: Diagnosis not present

## 2018-02-16 DIAGNOSIS — Z9981 Dependence on supplemental oxygen: Secondary | ICD-10-CM | POA: Diagnosis not present

## 2018-02-16 DIAGNOSIS — J45909 Unspecified asthma, uncomplicated: Secondary | ICD-10-CM | POA: Diagnosis not present

## 2018-02-16 DIAGNOSIS — I130 Hypertensive heart and chronic kidney disease with heart failure and stage 1 through stage 4 chronic kidney disease, or unspecified chronic kidney disease: Secondary | ICD-10-CM | POA: Diagnosis not present

## 2018-02-16 DIAGNOSIS — Z8489 Family history of other specified conditions: Secondary | ICD-10-CM | POA: Insufficient documentation

## 2018-02-16 DIAGNOSIS — Z8042 Family history of malignant neoplasm of prostate: Secondary | ICD-10-CM | POA: Insufficient documentation

## 2018-02-16 DIAGNOSIS — Z836 Family history of other diseases of the respiratory system: Secondary | ICD-10-CM | POA: Insufficient documentation

## 2018-02-16 DIAGNOSIS — I509 Heart failure, unspecified: Secondary | ICD-10-CM | POA: Diagnosis not present

## 2018-02-16 DIAGNOSIS — Z800 Family history of malignant neoplasm of digestive organs: Secondary | ICD-10-CM | POA: Insufficient documentation

## 2018-02-16 DIAGNOSIS — Z8261 Family history of arthritis: Secondary | ICD-10-CM | POA: Insufficient documentation

## 2018-02-16 DIAGNOSIS — Z803 Family history of malignant neoplasm of breast: Secondary | ICD-10-CM | POA: Insufficient documentation

## 2018-02-16 HISTORY — PX: INSERTION OF MESH: SHX5868

## 2018-02-16 HISTORY — PX: INGUINAL HERNIA REPAIR: SHX194

## 2018-02-16 SURGERY — REPAIR, HERNIA, INGUINAL, ADULT
Anesthesia: General | Site: Inguinal | Laterality: Left

## 2018-02-16 MED ORDER — SUGAMMADEX SODIUM 200 MG/2ML IV SOLN
INTRAVENOUS | Status: AC
  Filled 2018-02-16: qty 2

## 2018-02-16 MED ORDER — CHLORHEXIDINE GLUCONATE 4 % EX LIQD: 60.0000 mL | Freq: Once | CUTANEOUS | Status: DC

## 2018-02-16 MED ORDER — FENTANYL CITRATE (PF) 100 MCG/2ML IJ SOLN
50.0000 ug | INTRAMUSCULAR | Status: DC | PRN
  Administered 2018-02-16: 50 ug via INTRAVENOUS

## 2018-02-16 MED ORDER — DEXAMETHASONE SODIUM PHOSPHATE 10 MG/ML IJ SOLN
INTRAMUSCULAR | Status: DC | PRN
  Administered 2018-02-16: 10 mg via INTRAVENOUS

## 2018-02-16 MED ORDER — 0.9 % SODIUM CHLORIDE (POUR BTL) OPTIME
TOPICAL | Status: DC | PRN
  Administered 2018-02-16: 1000 mL

## 2018-02-16 MED ORDER — LIDOCAINE 2% (20 MG/ML) 5 ML SYRINGE
INTRAMUSCULAR | Status: DC | PRN
  Administered 2018-02-16: 80 mg via INTRAVENOUS

## 2018-02-16 MED ORDER — ONDANSETRON HCL 4 MG/2ML IJ SOLN
INTRAMUSCULAR | Status: AC
  Filled 2018-02-16: qty 6

## 2018-02-16 MED ORDER — PROPOFOL 10 MG/ML IV BOLUS
INTRAVENOUS | Status: DC | PRN
  Administered 2018-02-16: 140 mg via INTRAVENOUS

## 2018-02-16 MED ORDER — DEXAMETHASONE SODIUM PHOSPHATE 10 MG/ML IJ SOLN
INTRAMUSCULAR | Status: AC
  Filled 2018-02-16: qty 3

## 2018-02-16 MED ORDER — FENTANYL CITRATE (PF) 100 MCG/2ML IJ SOLN
INTRAMUSCULAR | Status: AC
  Filled 2018-02-16: qty 2

## 2018-02-16 MED ORDER — FENTANYL CITRATE (PF) 100 MCG/2ML IJ SOLN
INTRAMUSCULAR | Status: DC | PRN
  Administered 2018-02-16: 50 ug via INTRAVENOUS

## 2018-02-16 MED ORDER — ONDANSETRON HCL 4 MG/2ML IJ SOLN
INTRAMUSCULAR | Status: AC
  Filled 2018-02-16: qty 2

## 2018-02-16 MED ORDER — LACTATED RINGERS IV SOLN: INTRAVENOUS | Status: DC

## 2018-02-16 MED ORDER — KETOROLAC TROMETHAMINE 15 MG/ML IJ SOLN
INTRAMUSCULAR | Status: DC | PRN
  Administered 2018-02-16: 15 mg via INTRAVENOUS

## 2018-02-16 MED ORDER — MIDAZOLAM HCL 2 MG/2ML IJ SOLN
INTRAMUSCULAR | Status: AC
  Filled 2018-02-16: qty 2

## 2018-02-16 MED ORDER — ROPIVACAINE HCL 5 MG/ML IJ SOLN
INTRAMUSCULAR | Status: DC | PRN
  Administered 2018-02-16: 30 mL

## 2018-02-16 MED ORDER — PHENYLEPHRINE 40 MCG/ML (10ML) SYRINGE FOR IV PUSH (FOR BLOOD PRESSURE SUPPORT)
PREFILLED_SYRINGE | INTRAVENOUS | Status: DC | PRN
  Administered 2018-02-16 (×4): 80 ug via INTRAVENOUS

## 2018-02-16 MED ORDER — EPHEDRINE 5 MG/ML INJ
INTRAVENOUS | Status: AC
  Filled 2018-02-16: qty 10

## 2018-02-16 MED ORDER — BUPIVACAINE-EPINEPHRINE 0.5% -1:200000 IJ SOLN
INTRAMUSCULAR | Status: AC
  Filled 2018-02-16: qty 1

## 2018-02-16 MED ORDER — LIDOCAINE 2% (20 MG/ML) 5 ML SYRINGE
INTRAMUSCULAR | Status: DC | PRN
  Administered 2018-02-16: 1 mg/kg/h via INTRAVENOUS

## 2018-02-16 MED ORDER — SUGAMMADEX SODIUM 200 MG/2ML IV SOLN
INTRAVENOUS | Status: DC | PRN
  Administered 2018-02-16: 190 mg via INTRAVENOUS

## 2018-02-16 MED ORDER — BUPIVACAINE LIPOSOME 1.3 % IJ SUSP
20.0000 mL | Freq: Once | INTRAMUSCULAR | Status: DC
  Filled 2018-02-16: qty 20

## 2018-02-16 MED ORDER — GABAPENTIN 300 MG PO CAPS
300.0000 mg | ORAL_CAPSULE | ORAL | Status: AC
  Administered 2018-02-16: 300 mg via ORAL
  Filled 2018-02-16: qty 1

## 2018-02-16 MED ORDER — CEFAZOLIN SODIUM-DEXTROSE 2-4 GM/100ML-% IV SOLN
2.0000 g | INTRAVENOUS | Status: AC
  Administered 2018-02-16: 2 g via INTRAVENOUS
  Filled 2018-02-16: qty 100

## 2018-02-16 MED ORDER — ROCURONIUM BROMIDE 100 MG/10ML IV SOLN
INTRAVENOUS | Status: AC
  Filled 2018-02-16: qty 1

## 2018-02-16 MED ORDER — CLONIDINE HCL (ANALGESIA) 100 MCG/ML EP SOLN
EPIDURAL | Status: DC | PRN
  Administered 2018-02-16: 50 ug

## 2018-02-16 MED ORDER — LACTATED RINGERS IV SOLN
INTRAVENOUS | Status: DC | PRN
  Administered 2018-02-16: 13:00:00 via INTRAVENOUS

## 2018-02-16 MED ORDER — MIDAZOLAM HCL 2 MG/2ML IJ SOLN
1.0000 mg | INTRAMUSCULAR | Status: DC | PRN
  Administered 2018-02-16: 1 mg via INTRAVENOUS

## 2018-02-16 MED ORDER — EPHEDRINE SULFATE-NACL 50-0.9 MG/10ML-% IV SOSY
PREFILLED_SYRINGE | INTRAVENOUS | Status: DC | PRN
  Administered 2018-02-16: 10 mg via INTRAVENOUS
  Administered 2018-02-16 (×3): 5 mg via INTRAVENOUS
  Administered 2018-02-16: 10 mg via INTRAVENOUS
  Administered 2018-02-16: 5 mg via INTRAVENOUS

## 2018-02-16 MED ORDER — ROCURONIUM BROMIDE 10 MG/ML (PF) SYRINGE
PREFILLED_SYRINGE | INTRAVENOUS | Status: DC | PRN
  Administered 2018-02-16: 60 mg via INTRAVENOUS

## 2018-02-16 MED ORDER — ONDANSETRON HCL 4 MG/2ML IJ SOLN
INTRAMUSCULAR | Status: DC | PRN
  Administered 2018-02-16: 4 mg via INTRAVENOUS

## 2018-02-16 MED ORDER — PHENYLEPHRINE 40 MCG/ML (10ML) SYRINGE FOR IV PUSH (FOR BLOOD PRESSURE SUPPORT)
PREFILLED_SYRINGE | INTRAVENOUS | Status: AC
  Filled 2018-02-16: qty 10

## 2018-02-16 MED ORDER — BUPIVACAINE-EPINEPHRINE (PF) 0.25% -1:200000 IJ SOLN
INTRAMUSCULAR | Status: DC | PRN
  Administered 2018-02-16: 30 mL

## 2018-02-16 MED ORDER — ACETAMINOPHEN 500 MG PO TABS
1000.0000 mg | ORAL_TABLET | ORAL | Status: AC
  Administered 2018-02-16: 1000 mg via ORAL
  Filled 2018-02-16: qty 2

## 2018-02-16 SURGICAL SUPPLY — 32 items
BENZOIN TINCTURE PRP APPL 2/3 (GAUZE/BANDAGES/DRESSINGS) IMPLANT
COVER SURGICAL LIGHT HANDLE (MISCELLANEOUS) ×2 IMPLANT
COVER WAND RF STERILE (DRAPES) ×2 IMPLANT
DECANTER SPIKE VIAL GLASS SM (MISCELLANEOUS) IMPLANT
DERMABOND ADVANCED (GAUZE/BANDAGES/DRESSINGS) ×1
DERMABOND ADVANCED .7 DNX12 (GAUZE/BANDAGES/DRESSINGS) ×1 IMPLANT
DRAIN PENROSE 18X1/2 LTX STRL (DRAIN) IMPLANT
DRAPE LAPAROTOMY T 98X78 PEDS (DRAPES) ×2 IMPLANT
DRSG TEGADERM 4X4.75 (GAUZE/BANDAGES/DRESSINGS) IMPLANT
ELECT REM PT RETURN 15FT ADLT (MISCELLANEOUS) ×2 IMPLANT
GLOVE BIO SURGEON STRL SZ7.5 (GLOVE) ×2 IMPLANT
GLOVE BIOGEL PI IND STRL 7.0 (GLOVE) ×1 IMPLANT
GLOVE BIOGEL PI IND STRL 7.5 (GLOVE) ×1 IMPLANT
GLOVE BIOGEL PI INDICATOR 7.0 (GLOVE) ×1
GLOVE BIOGEL PI INDICATOR 7.5 (GLOVE) ×1
GLOVE INDICATOR 8.0 STRL GRN (GLOVE) ×2 IMPLANT
GOWN STRL REUS W/ TWL LRG LVL3 (GOWN DISPOSABLE) ×1 IMPLANT
GOWN STRL REUS W/TWL LRG LVL3 (GOWN DISPOSABLE) ×1
GOWN STRL REUS W/TWL XL LVL3 (GOWN DISPOSABLE) ×2 IMPLANT
KIT BASIN OR (CUSTOM PROCEDURE TRAY) ×2 IMPLANT
MESH ULTRAPRO 3X6 7.6X15CM (Mesh General) ×2 IMPLANT
NEEDLE HYPO 22GX1.5 SAFETY (NEEDLE) ×2 IMPLANT
PACK GENERAL/GYN (CUSTOM PROCEDURE TRAY) ×2 IMPLANT
STRIP CLOSURE SKIN 1/2X4 (GAUZE/BANDAGES/DRESSINGS) IMPLANT
SUT MNCRL AB 4-0 PS2 18 (SUTURE) ×2 IMPLANT
SUT PROLENE 2 0 CT2 30 (SUTURE) ×4 IMPLANT
SUT VIC AB 2-0 SH 27 (SUTURE) ×1
SUT VIC AB 2-0 SH 27X BRD (SUTURE) ×1 IMPLANT
SUT VIC AB 3-0 SH 18 (SUTURE) ×2 IMPLANT
SYR 20CC LL (SYRINGE) ×2 IMPLANT
TOWEL OR 17X26 10 PK STRL BLUE (TOWEL DISPOSABLE) ×2 IMPLANT
TOWEL OR NON WOVEN STRL DISP B (DISPOSABLE) ×2 IMPLANT

## 2018-02-16 NOTE — Op Note (Signed)
Johnny Peck [id number] 04-18-1946 02/16/2018'  Open Repair of Recurrent Left Indirect Inguinal Hernia  with Mesh Procedure Note  Indications: The patient presented with a history of a left, reducible recurrent hernia.  I had previously repaired performed a laparoscopic repair of a left indirect inguinal hernia with mesh in 2016.  He had a very large cord lipoma that extended up into the retroperitoneum.  Because he has had recurrence I recommended an open approach  Pre-operative Diagnosis: left reducible inguinal hernia  Post-operative Diagnosis: left indirect recurrent inguinal hernia  Surgeon: Greer Pickerel MD FACS  Assistants: none  Anesthesia: General endotracheal anesthesia and TAPP block  Procedure Details  The patient was seen again in the Holding Room. The risks, benefits, complications, treatment options, and expected outcomes were discussed with the patient. The possibilities of reaction to medication, pulmonary aspiration, perforation of viscus, bleeding, recurrent infection, the need for additional procedures, and development of a complication requiring transfusion or further operation were discussed with the patient and/or family. The likelihood of success in repairing the hernia and returning the patient to their previous functional status is good.  There was concurrence with the proposed plan, and informed consent was obtained. The site of surgery was properly noted/marked. The patient was taken to the Operating Room, identified as Johnny Peck, and the procedure verified as left inguinal hernia repair. A Time Out was held and the above information confirmed.  He had received a tap block by anesthesia.  He also received oral E. Ras medications.    The patient was placed in the supine position and underwent induction of anesthesia. The lower abdomen and groin was prepped with Chloraprep and draped in the standard fashion, and 0.25% Marcaine with epinephrine was used to  anesthetize the skin over the mid-portion of the inguinal canal. An oblique incision was made. Dissection was carried down through the subcutaneous tissue with cautery to the external oblique fascia.  We opened the external oblique fascia along the direction of its fibers to the external ring.  The spermatic cord was circumferentially dissected bluntly and retracted with a Penrose drain.  The floor of the inguinal canal was inspected and there was no defect in the floor of the inguinal canal.  We skeletonized the spermatic cord and isolated the hernia sac.  It was small and somewhat thickened.  The ilioinguinal nerve was identified and preserved.  He had a little bit of fat tissue around the testicular vessels but I could not really debulk it that much without realizing the blood supply to the testicle.  I decided to ligate the hernia sac.  A 3-0 Vicryl tie was placed around the base of the hernia sac and it was then transected above..  We used a 3 x 6 inch piece of Ultrapro mesh, which was cut into a keyhole shape.  This was secured with 2-0 Prolene, beginning at the pubic tubercle, running this along the shelving edge inferiorly. Superiorly, the mesh was secured to the internal oblique fascia with interrupted 2-0 Prolene sutures.  The tails of the mesh were sutured together behind the spermatic cord.  The mesh was tucked underneath the external oblique fascia laterally.  The external oblique fascia was reapproximated with 2-0 Vicryl.  3-0 Vicryl was used to close the subcutaneous tissues and 4-0 Monocryl was used to close the skin in subcuticular fashion.  Dermabond was used to seal the incision.   The patient was then extubated and brought to the recovery room in stable condition.  All  sponge, instrument, and needle counts were correct prior to closure and at the conclusion of the case.   Estimated Blood Loss: Minimal                 Complications: None; patient tolerated the procedure well.          Disposition: PACU - hemodynamically stable.         Condition: stable  Johnny Peck. Redmond Pulling, MD, FACS General, Bariatric, & Minimally Invasive Surgery [facility], [place]

## 2018-02-16 NOTE — Anesthesia Procedure Notes (Signed)
Procedure Name: Intubation Date/Time: 02/16/2018 2:31 PM Performed by: Lavina Hamman, CRNA Pre-anesthesia Checklist: Patient identified, Emergency Drugs available, Suction available, Patient being monitored and Timeout performed Patient Re-evaluated:Patient Re-evaluated prior to induction Oxygen Delivery Method: Circle system utilized Preoxygenation: Pre-oxygenation with 100% oxygen Induction Type: IV induction Ventilation: Mask ventilation without difficulty Laryngoscope Size: Mac and 4 Grade View: Grade I Tube type: Oral Tube size: 7.5 mm Number of attempts: 1 Airway Equipment and Method: Stylet Placement Confirmation: ETT inserted through vocal cords under direct vision,  positive ETCO2,  CO2 detector and breath sounds checked- equal and bilateral Secured at: 23 cm Tube secured with: Tape Dental Injury: Teeth and Oropharynx as per pre-operative assessment

## 2018-02-16 NOTE — H&P (Signed)
Johnny Peck Documented: 12/23/2017 10:09 AM Location: [facility] Patient #: [id number] DOB: 01/06/1946 Married / Language: [name] / Race: White Male   History of Present Illness Johnny Peck; 12/23/2017 12:44 PM) The patient is a 72 year old male who presents with inguinal swelling. He comes in with complaints of recurrent discomfort as well as intermittent swelling in his left groin. He underwent laparoscopic repair of a left indirect inguinal hernia with mesh in 2016 by me. 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 He states about a month and have ago he started noticing some discomfort in his left groin. He noticed an intermittent bulge. He felt like a gas sensation in the area. He has had some intermittent burning as well. If he lays flat he is able to push it down and have some flatus and it feels better. It is more noticeable toward the end of the day. He denies any nausea, vomiting, diarrhea or constipation. 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 He states right after that procedure a few months ago he noticed an immediate improvement but now he started to have some recurrent symptoms in the sense that is taking him a while to void but he will eventually empty his bladder. He does not feel like he is not completely emptying his bladder. He plans to follow up with his urologist soon   Problem List/Past Medical Johnny Hiss M. 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 Open Inguinal Hernia Surgery  Left, Right. Tonsillectomy  Vasectomy   Diagnostic Studies History Johnny Peck; 12/23/2017 10:09 AM) Colonoscopy  within last year 5-10 years ago  Allergies Johnny Bers  Peck; 12/23/2017 10:10 AM) No Known Drug Allergies [06/27/2014]: Allergies Reconciled   Medication History Johnny Hiss M. Redmond Pulling, Peck; 12/23/2017 12:48 PM) traMADol HCl (50MG  Tablet, Oral) Active. Crestor (10MG  Tablet, Oral) Active. Omeprazole (10MG  Capsule DR, Oral) Active. Meloxicam (15MG  Tablet, Oral) Active. Metoprolol Succinate ER (50MG  Tablet ER 24HR, Oral) Active. Cialis (2.5MG  Tablet, Oral) Active. CoQ10 (100MG  Capsule, Oral) Active. Multi Vitamin (Oral) Active. Myrbetriq (50MG  Tablet ER 24HR, Oral) Active. Tamsulosin HCl (0.4MG  Capsule, Oral) Active. Medications Reconciled Lisinopril (10MG  Tablet, Oral) Active. Mobic (15MG  Tablet, Oral) Active. Flomax (0.4MG  Capsule ER 24HR, Oral) Active.  Social History Johnny Peck; 12/23/2017 10:09 AM) Alcohol use  Moderate alcohol use, Occasional alcohol use. Caffeine use  Carbonated beverages, Tea. Illicit drug use  Prefer to discuss with provider. Tobacco use  Current some day smoker, Former smoker.  Family History Johnny Hiss M. Redmond Pulling, Peck; 12/23/2017 12:48 PM) Alcohol Abuse  Daughter, Family Members In General. Anesthetic complications  Daughter, Family Members In General. Arthritis  Family Members In General, Father, Mother. Bleeding disorder  Daughter, Family Members In General. Breast Cancer  Family Members In General. Cervical Cancer  Family Members In General. Colon Cancer  Family Members In General. Colon Polyps  Family Members In General. Depression  Family Members In General. Diabetes Mellitus  Family Members In General. Heart Disease  Family Members In General, Father, Mother. Hypertension  Daughter, Family Members In General, Father, Mother. Kidney Disease  Daughter, Family Members In [place], Mother, Son. Malignant Neoplasm Of Pancreas  Family Members In General. Migraine Headache  Family Members In General. Ovarian Cancer  Family Members In General.  Prostate Cancer  Father. Rectal  Cancer  Family Members In General. Respiratory Condition  Daughter, Family Members In [place], Son. Arthritis  Father, Mother.  Other Problems Johnny Hiss M. Redmond Pulling, Peck; 12/23/2017 12:48 PM) Arthritis  Asthma  Back Pain  Bladder Problems  Chronic Renal Failure Syndrome  Congestive Heart Failure  Gastroesophageal Reflux Disease  Hemorrhoids  Hepatitis  High blood pressure  Home Oxygen Use  Hypercholesterolemia  Inguinal Hernia  Migraine Headache  Transfusion history  Umbilical Hernia Repair  Other disease, cancer, significant illness     Review of Systems Johnny Hiss M. Johnny Turski Peck; 12/23/2017 12:44 PM) General Not Present- Appetite Loss, Chills, Fatigue, Fever, Night Sweats, Weight Gain and Weight Loss. Skin Not Present- Change in Wart/Mole, Dryness, Hives, Jaundice, New Lesions, Non-Healing Wounds, Rash and Ulcer. HEENT Not Present- Earache, Hearing Loss, Hoarseness, Nose Bleed, Oral Ulcers, Ringing in the Ears, Seasonal Allergies, Sinus Pain, Sore Throat, Visual Disturbances, Wears glasses/contact lenses and Yellow Eyes. Respiratory Not Present- Bloody sputum, Chronic Cough, Difficulty Breathing, Snoring and Wheezing. Breast Not Present- Breast Mass, Breast Pain, Nipple Discharge and Skin Changes. Cardiovascular Not Present- Chest Pain, Difficulty Breathing Lying Down, Leg Cramps, Palpitations, Rapid Heart Rate, Shortness of Breath and Swelling of Extremities. Gastrointestinal Present- Abdominal Pain. Not Present- Bloating, Bloody Stool, Change in Bowel Habits, Chronic diarrhea, Constipation, Difficulty Swallowing, Excessive gas, Gets full quickly at meals, Hemorrhoids, Indigestion, Nausea, Rectal Pain and Vomiting. Male Genitourinary Not Present- Blood in Urine, Change in Urinary Stream, Frequency, Impotence, Nocturia, Painful Urination, Urgency and Urine Leakage. All other systems negative  Vitals Johnny Peck; 12/23/2017 10:15 AM) 12/23/2017 10:15 AM Weight: 201 lb  Height: 70in Body Surface Area: 2.09 m Body Mass Index: 28.84 kg/m  Pain Level: 3/10 Temp.: 98.54F(Temporal)  Pulse: 73 (Regular)  P.OX: 94% (Room air) BP: 158/86 (Sitting, Left Arm, Standard)       Physical Exam Johnny Hiss M. Salik Grewell Peck; 12/23/2017 12:45 PM) General Mental Status-Alert. General Appearance-Consistent with stated age. Hydration-Well hydrated. Voice-Normal.  Head and Neck Head-normocephalic, atraumatic with no lesions or palpable masses. Trachea-midline. Thyroid Gland Characteristics - normal size and consistency.  Eye Eyeball - Bilateral-Extraocular movements intact. Sclera/Conjunctiva - Bilateral-No scleral icterus.  ENMT Ears -Note: normal external ears.  Mouth and Throat -Note: lips intact.   Chest and Lung Exam Chest and lung exam reveals -quiet, even and easy respiratory effort with no use of accessory muscles and on auscultation, normal breath sounds, no adventitious sounds and normal vocal resonance. Inspection Chest Wall - Normal. Back - normal.  Breast - Did not examine.  Cardiovascular Cardiovascular examination reveals -normal heart sounds, regular rate and rhythm with no murmurs and normal pedal pulses bilaterally.  Abdomen Inspection  Inspection of the abdomen reveals: Note: well healed Rt groin incision and trocar scars No obvious bulge upon standing. Both testicles are descended. No scrotal testicular masses. Palpation of right inguinal canal reveals no bulge with Valsalva. Palpation of the left inguinal canal with valsalva reveals an impulse - small bulge. Skin - Scar - no surgical scars. Palpation/Percussion Palpation and Percussion of the abdomen reveal - Soft, Non Tender, No Rebound tenderness, No Rigidity (guarding) and No hepatosplenomegaly. Auscultation Auscultation of the abdomen reveals - Bowel sounds normal.  Male Genitourinary Note: both testicles down; old right inguinal incision; no  obvious bulge supine. pt examined supine and standing. on standing with valsalva, there is lateral LEFT inguinal bulge. reducible.   Peripheral Vascular Upper Extremity Palpation - Pulses bilaterally normal.  Neurologic Neurologic evaluation reveals -alert and oriented  x 3 with no impairment of recent or remote memory. Mental Status-Normal.  Neuropsychiatric The patient's mood and affect are described as -normal. Judgment and Insight-insight is appropriate concerning matters relevant to self.  Musculoskeletal Normal Exam - Left-Upper Extremity Strength Normal and Lower Extremity Strength Normal. Normal Exam - Right-Upper Extremity Strength Normal and Lower Extremity Strength Normal.  Lymphatic Head & Neck  General Head & Neck Lymphatics: Bilateral - Description - Normal. Axillary - Did not examine. Femoral & Inguinal - Did not examine.    Assessment & Plan Johnny Hiss M. 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 On exam it is not obvious but there is a small bulge with Valsalva. I don't think imaging would be of significant benefit. Since he has had a laparoscopic approach in the past I recommended an open approach  We discussed the etiology of inguinal hernias. We discussed the signs & symptoms of incarceration & strangulation. We discussed non-operative and operative management.  The patient has elected to proceed with open repair of left recurrent inguinal hernia with mesh  I described the procedure in detail. The patient was given educational material. We discussed the risks and benefits including but not limited to bleeding, infection, chronic inguinal pain, nerve entrapment, hernia recurrence, mesh complications, hematoma formation, urinary retention, injury to the testicle, numbness in the groin, blood clots,  injury to the surrounding structures, and anesthesia risk. We also discussed the typical post operative recovery course, including no heavy lifting for 4-6 weeks. I explained that the likelihood of improvement of their symptoms is good.  I explained that he has a slight increased risk for recurrence, injury to surrounding structures since this is potentially a recurrent hernia.  We also discussed the possibility of urinary retention postoperatively. Since he has had a urethral stricture that has required surgery (if he were to go into retention after this hernia surgery) then we may have to get urology involved. He voiced understanding. Current Plans Pt Education - Pamphlet Given - Hernia Surgery: discussed with patient and provided information.  [name]. Redmond Pulling, Peck, FACS General, Bariatric, & Minimally Invasive Surgery [facility], [place]

## 2018-02-16 NOTE — Anesthesia Postprocedure Evaluation (Signed)
Anesthesia Post Note  Patient: Johnny Peck  Procedure(s) Performed: OPEN REPAIR RECURRENT LEFT INGUINAL HERNIA  ERAS PATHWAY (Left Inguinal) INSERTION OF MESH (Left Inguinal)     Patient location during evaluation: PACU Anesthesia Type: General Level of consciousness: awake and alert Pain management: pain level controlled Vital Signs Assessment: post-procedure vital signs reviewed and stable Respiratory status: spontaneous breathing, nonlabored ventilation and respiratory function stable Cardiovascular status: blood pressure returned to baseline and stable Postop Assessment: no apparent nausea or vomiting Anesthetic complications: no    Last Vitals:  Vitals:   02/16/18 1645 02/16/18 1713  BP: 132/75   Pulse: 66   Resp: 20 18  Temp: 36.9 C (!) 36.3 C  SpO2: 94% 100%    Last Pain:  Vitals:   02/16/18 1645  TempSrc:   PainSc: 0-No pain                 Lynda Rainwater

## 2018-02-16 NOTE — Progress Notes (Signed)
Discussed dc instructions and recovery with pt's caregiver on the phone  [name]. Redmond Pulling, MD, FACS General, Bariatric, & Minimally Invasive Surgery [facility], [place]

## 2018-02-16 NOTE — Anesthesia Preprocedure Evaluation (Addendum)
Anesthesia Evaluation    Airway Mallampati: II  TM Distance: >3 FB Neck ROM: Full    Dental no notable dental hx.    Pulmonary former smoker,    Pulmonary exam normal breath sounds clear to auscultation       Cardiovascular hypertension, Normal cardiovascular exam Rhythm:Regular Rate:Normal     Neuro/Psych    GI/Hepatic   Endo/Other    Renal/GU      Musculoskeletal   Abdominal   Peds  Hematology   Anesthesia Other Findings   Reproductive/Obstetrics                             Anesthesia Physical Anesthesia Plan  ASA: II  Anesthesia Plan: General   Post-op Pain Management:    Induction: Intravenous  PONV Risk Score and Plan:   Airway Management Planned:   Additional Equipment:   Intra-op Plan:   Post-operative Plan: Extubation in OR  Informed Consent: I have reviewed the patients History and Physical, chart, labs and discussed the procedure including the risks, benefits and alternatives for the proposed anesthesia with the patient or authorized representative who has indicated his/her understanding and acceptance.   Dental advisory given  Plan Discussed with: CRNA  Anesthesia Plan Comments:         Anesthesia Quick Evaluation

## 2018-02-16 NOTE — Discharge Instructions (Signed)
[facility]  UMBILICAL OR INGUINAL HERNIA REPAIR: POST OP INSTRUCTIONS  Always review your discharge instruction sheet given to you by the facility where your surgery was performed. IF YOU HAVE DISABILITY OR FAMILY LEAVE FORMS, YOU MUST BRING THEM TO THE OFFICE FOR PROCESSING.   DO NOT GIVE THEM TO YOUR DOCTOR.  1. See PAIN CONTROL handout below 2. Take your usually prescribed medications unless otherwise directed. 3. If you need a refill on your pain medication, please contact your pharmacy.  They will contact our office to request authorization. Prescriptions will not be filled after 5 pm or on week-ends. 4. You should follow a light diet the first 24 hours after arrival home, such as soup and crackers, etc.  Be sure to include lots of fluids daily.  Resume your normal diet the day after surgery. 5. Most patients will experience some swelling and bruising around the umbilicus or in the groin and scrotum.  Ice packs and reclining will help.  Swelling and bruising can take several days to resolve.  6. It is common to experience some constipation if taking pain medication after surgery.  Increasing fluid intake and taking a stool softener (such as Colace) will usually help or prevent this problem from occurring.  A mild laxative (Milk of Magnesia or Miralax) should be taken according to package directions if there are no bowel movements after 48 hours. 7. Unless discharge instructions indicate otherwise, you may remove your bandages 24-48 hours after surgery, and you may shower at that time.  You may have steri-strips (small skin tapes) in place directly over the incision.  These strips should be left on the skin for 7-10 days.  If your surgeon used skin glue on the incision, you may shower in 24 hours.  The glue will flake off over the next 2-3 weeks.  Any sutures or staples will be removed at the office during your follow-up visit. 8. ACTIVITIES:  You may resume regular (light)  daily activities beginning the next day--such as daily self-care, walking, climbing stairs--gradually increasing activities as tolerated.  You may have sexual intercourse when it is comfortable.  Refrain from any heavy lifting or straining until approved by your doctor. a. You may drive when you are no longer taking prescription pain medication, you can comfortably wear a seatbelt, and you can safely maneuver your car and apply brakes. b. RETURN TO WORK:  9. You should see your doctor in the office for a follow-up appointment approximately 2-3 weeks after your surgery.  Make sure that you call for this appointment within a day or two after you arrive home to insure a convenient appointment time. 10. OTHER INSTRUCTIONS:  DO NOT LIFT/PUSH/PULL ANYTHING GREATER THAN 10 LBS FOR 6 WEEKS   WHEN TO CALL YOUR DOCTOR: 1. Fever over 101.0 2. Inability to urinate 3. Nausea and/or vomiting 4. Extreme swelling or bruising 5. Continued bleeding from incision. 6. Increased pain, redness, or drainage from the incision  The clinic staff is available to answer your questions during regular business hours.  Please dont hesitate to call and ask to speak to one of the nurses for clinical concerns.  If you have a medical emergency, go to the nearest emergency room or call 911.  A surgeon from [facility] is always on call at the hospital   [address], [place], [place], [place]  [address] ?  [address], [place], [place]   [address] [phone] ? [phone] ? FAX [phone]  [phone] Web site: [email or web address]     Managing Your Pain After Surgery Without Opioids    Thank you for participating in our program to help patients manage their pain after surgery without opioids. This is part of our effort to provide you with the best care possible, without exposing you or your family to the risk that opioids pose.  What pain can I expect after surgery? You can expect  to have some pain after surgery. This is normal. The pain is typically worse the day after surgery, and quickly begins to get better. Many studies have found that many patients are able to manage their pain after surgery with Over-the-Counter (OTC) medications such as Tylenol and Motrin. If you have a condition that does not allow you to take Tylenol or Motrin, notify your surgical team.  How will I manage my pain? The best strategy for controlling your pain after surgery is around the clock pain control with Tylenol (acetaminophen) and Motrin (ibuprofen or Advil). Alternating these medications with each other allows you to maximize your pain control. In addition to Tylenol and Motrin, you can use heating pads or ice packs on your incisions to help reduce your pain.  How will I alternate your regular strength over-the-counter pain medication? You will take a dose of pain medication every three hours. ; Start by taking 650 mg of Tylenol (2 pills of 325 mg) ; 3 hours later take 600 mg of Motrin (3 pills of 200 mg) ; 3 hours after taking the Motrin take 650 mg of Tylenol ; 3 hours after that take 600 mg of Motrin.   - 1 -  See example - if your first dose of Tylenol is at 12:00 PM   12:00 PM Tylenol 650 mg (2 pills of 325 mg)  3:00 PM Motrin 600 mg (3 pills of 200 mg)  6:00 PM Tylenol 650 mg (2 pills of 325 mg)  9:00 PM Motrin 600 mg (3 pills of 200 mg)  Continue alternating every 3 hours   We recommend that you follow this schedule around-the-clock for at least 3 days after surgery, or until you feel that it is no longer needed. Use the table on the last page of this handout to keep track of the medications you are taking. Important: Do not take more than 3000mg  of Tylenol or 3200mg  of Motrin in a 24-hour period. Do not take ibuprofen/Motrin if you have a history of bleeding stomach ulcers, severe kidney disease, &/or actively taking a blood thinner  What if I still have pain? If you  have pain that is not controlled with the over-the-counter pain medications (Tylenol and Motrin or Advil) you might have what we call breakthrough pain. You will receive a prescription for a small amount of an opioid pain medication such as Oxycodone, Tramadol, or Tylenol with Codeine. Use these opioid pills in the first 24 hours after surgery if you have breakthrough pain. Do not take more than 1 pill every 4-6 hours.  If you still have uncontrolled pain after using all opioid pills, don't hesitate to call our staff using the number provided. We will help make sure you are managing your pain in the best way possible, and if necessary, we can provide a prescription for additional pain medication.   Day 1    Time  Name of Medication Number of pills taken  Amount of Acetaminophen  Pain Level   Comments  AM PM       AM PM  AM PM       AM PM       AM PM       AM PM       AM PM       AM PM       Total Daily amount of Acetaminophen Do not take more than  3,000 mg per day      Day 2    Time  Name of Medication Number of pills taken  Amount of Acetaminophen  Pain Level   Comments  AM PM       AM PM       AM PM       AM PM       AM PM       AM PM       AM PM       AM PM       Total Daily amount of Acetaminophen Do not take more than  3,000 mg per day      Day 3    Time  Name of Medication Number of pills taken  Amount of Acetaminophen  Pain Level   Comments  AM PM       AM PM       AM PM       AM PM          AM PM       AM PM       AM PM       AM PM       Total Daily amount of Acetaminophen Do not take more than  3,000 mg per day      Day 4    Time  Name of Medication Number of pills taken  Amount of Acetaminophen  Pain Level   Comments  AM PM       AM PM       AM PM       AM PM       AM PM       AM PM       AM PM       AM PM       Total Daily amount of Acetaminophen Do not take more than  3,000 mg per day      Day 5    Time   Name of Medication Number of pills taken  Amount of Acetaminophen  Pain Level   Comments  AM PM       AM PM       AM PM       AM PM       AM PM       AM PM       AM PM       AM PM       Total Daily amount of Acetaminophen Do not take more than  3,000 mg per day       Day 6    Time  Name of Medication Number of pills taken  Amount of Acetaminophen  Pain Level  Comments  AM PM       AM PM       AM PM       AM PM       AM PM       AM PM       AM PM       AM PM       Total Daily amount  of Acetaminophen Do not take more than  3,000 mg per day      Day 7    Time  Name of Medication Number of pills taken  Amount of Acetaminophen  Pain Level   Comments  AM PM       AM PM       AM PM       AM PM       AM PM       AM PM       AM PM       AM PM       Total Daily amount of Acetaminophen Do not take more than  3,000 mg per day        For additional information about how and where to safely dispose of unused opioid medications - [employer]  Disclaimer: This document contains information and/or instructional materials adapted from [place] for the typical patient with your condition. It does not replace medical advice from your health care provider because your experience may differ from that of the typical patient. Talk to your health care provider if you have any questions about this document, your condition or your treatment plan. Adapted from [place]

## 2018-02-16 NOTE — Anesthesia Procedure Notes (Signed)
Anesthesia Regional Block: TAP block   Pre-Anesthetic Checklist: ,, timeout performed, Correct Patient, Correct Site, Correct Laterality, Correct Procedure, Correct Position, site marked, Risks and benefits discussed,  Surgical consent,  Pre-op evaluation,  At surgeon's request and post-op pain management  Laterality: Left  Prep: chloraprep       Needles:  Injection technique: Single-shot  Needle Type: Echogenic Needle     Needle Length: 9cm      Additional Needles:   Procedures:,,,, ultrasound used (permanent image in chart),,,,  Narrative:  Start time: 02/16/2018 2:00 PM End time: 02/16/2018 2:08 PM Injection made incrementally with aspirations every 5 mL.  Performed by: Personally  Anesthesiologist: Myrtie Soman, MD  Additional Notes: Patient tolerated the procedure well without complications

## 2018-02-16 NOTE — Progress Notes (Signed)
Assisted Dr. Rose with left, ultrasound guided, transabdominal plane block. Side rails up, monitors on throughout procedure. See vital signs in flow sheet. Tolerated Procedure well.  

## 2018-02-16 NOTE — Transfer of Care (Signed)
Immediate Anesthesia Transfer of Care Note  Patient: Johnny Peck  Procedure(s) Performed: Procedure(s): OPEN REPAIR RECURRENT LEFT INGUINAL HERNIA  ERAS PATHWAY (Left) INSERTION OF MESH (Left)  Patient Location: PACU  Anesthesia Type:General  Level of Consciousness:  sedated, patient cooperative and responds to stimulation  Airway & Oxygen Therapy:Patient Spontanous Breathing and Patient connected to face mask oxgen  Post-op Assessment:  Report given to PACU RN and Post -op Vital signs reviewed and stable  Post vital signs:  Reviewed and stable  Last Vitals:  Vitals:   02/16/18 1417 02/16/18 1604  BP:    Pulse: (!) 57   Resp: 11   Temp:  (P) 37.1 C  SpO2: 16%     Complications: No apparent anesthesia complications

## 2018-02-16 NOTE — Interval H&P Note (Signed)
History and Physical Interval Note:  02/16/2018 2:13 PM  Johnny Peck  has presented today for surgery, with the diagnosis of recurrent left inguinal hernia  The various methods of treatment have been discussed with the patient and family. After consideration of risks, benefits and other options for treatment, the patient has consented to  Procedure(s): OPEN REPAIR RECURRENT LEFT INGUINAL HERNIA  ERAS PATHWAY (Left) INSERTION OF MESH (N/A) as a surgical intervention .  The patient's history has been reviewed, patient examined, no change in status, stable for surgery.  I have reviewed the patient's chart and labs.  Questions were answered to the patient's satisfaction.     Greer Pickerel

## 2018-02-16 NOTE — Anesthesia Procedure Notes (Signed)
Anesthesia Procedure Image    

## 2018-02-17 ENCOUNTER — Encounter (HOSPITAL_COMMUNITY): Payer: Self-pay | Admitting: General Surgery

## 2018-02-21 DIAGNOSIS — Z6829 Body mass index (BMI) 29.0-29.9, adult: Secondary | ICD-10-CM | POA: Diagnosis not present

## 2018-02-21 DIAGNOSIS — I10 Essential (primary) hypertension: Secondary | ICD-10-CM | POA: Diagnosis not present

## 2018-02-21 DIAGNOSIS — R739 Hyperglycemia, unspecified: Secondary | ICD-10-CM | POA: Diagnosis not present

## 2018-03-05 ENCOUNTER — Encounter (HOSPITAL_COMMUNITY): Payer: Self-pay

## 2018-03-05 ENCOUNTER — Inpatient Hospital Stay (HOSPITAL_COMMUNITY)
Admission: EM | Admit: 2018-03-05 | Discharge: 2018-03-13 | DRG: 857 | Disposition: A | Discharge status: Home Health | Payer: Medicare Other | Attending: General Surgery | Admitting: General Surgery

## 2018-03-05 ENCOUNTER — Other Ambulatory Visit: Payer: Self-pay

## 2018-03-05 ENCOUNTER — Emergency Department (HOSPITAL_COMMUNITY): Payer: Medicare Other

## 2018-03-05 DIAGNOSIS — K219 Gastro-esophageal reflux disease without esophagitis: Secondary | ICD-10-CM | POA: Diagnosis not present

## 2018-03-05 DIAGNOSIS — T8141XA Infection following a procedure, superficial incisional surgical site, initial encounter: Principal | ICD-10-CM | POA: Diagnosis present

## 2018-03-05 DIAGNOSIS — D649 Anemia, unspecified: Secondary | ICD-10-CM | POA: Diagnosis not present

## 2018-03-05 DIAGNOSIS — T8149XA Infection following a procedure, other surgical site, initial encounter: Secondary | ICD-10-CM

## 2018-03-05 DIAGNOSIS — L03314 Cellulitis of groin: Secondary | ICD-10-CM | POA: Diagnosis present

## 2018-03-05 DIAGNOSIS — Z79899 Other long term (current) drug therapy: Secondary | ICD-10-CM

## 2018-03-05 DIAGNOSIS — N179 Acute kidney failure, unspecified: Secondary | ICD-10-CM | POA: Diagnosis not present

## 2018-03-05 DIAGNOSIS — R739 Hyperglycemia, unspecified: Secondary | ICD-10-CM | POA: Diagnosis not present

## 2018-03-05 DIAGNOSIS — K4090 Unilateral inguinal hernia, without obstruction or gangrene, not specified as recurrent: Secondary | ICD-10-CM | POA: Diagnosis not present

## 2018-03-05 DIAGNOSIS — M1990 Unspecified osteoarthritis, unspecified site: Secondary | ICD-10-CM | POA: Diagnosis not present

## 2018-03-05 DIAGNOSIS — Z7982 Long term (current) use of aspirin: Secondary | ICD-10-CM | POA: Diagnosis not present

## 2018-03-05 DIAGNOSIS — E871 Hypo-osmolality and hyponatremia: Secondary | ICD-10-CM | POA: Diagnosis not present

## 2018-03-05 DIAGNOSIS — I10 Essential (primary) hypertension: Secondary | ICD-10-CM | POA: Diagnosis present

## 2018-03-05 DIAGNOSIS — I4510 Unspecified right bundle-branch block: Secondary | ICD-10-CM | POA: Diagnosis not present

## 2018-03-05 DIAGNOSIS — L03115 Cellulitis of right lower limb: Secondary | ICD-10-CM | POA: Diagnosis present

## 2018-03-05 DIAGNOSIS — T8140XA Infection following a procedure, unspecified, initial encounter: Secondary | ICD-10-CM | POA: Diagnosis not present

## 2018-03-05 DIAGNOSIS — L03311 Cellulitis of abdominal wall: Secondary | ICD-10-CM | POA: Diagnosis not present

## 2018-03-05 DIAGNOSIS — Y848 Other medical procedures as the cause of abnormal reaction of the patient, or of later complication, without mention of misadventure at the time of the procedure: Secondary | ICD-10-CM | POA: Diagnosis present

## 2018-03-05 DIAGNOSIS — R1032 Left lower quadrant pain: Secondary | ICD-10-CM | POA: Diagnosis not present

## 2018-03-05 DIAGNOSIS — L0390 Cellulitis, unspecified: Secondary | ICD-10-CM | POA: Diagnosis present

## 2018-03-05 DIAGNOSIS — Z87891 Personal history of nicotine dependence: Secondary | ICD-10-CM | POA: Diagnosis not present

## 2018-03-05 LAB — COMPREHENSIVE METABOLIC PANEL
ALK PHOS: 78 U/L (ref 38–126)
ALT: 56 U/L — AB (ref 0–44)
AST: 72 U/L — ABNORMAL HIGH (ref 15–41)
Albumin: 3 g/dL — ABNORMAL LOW (ref 3.5–5.0)
Anion gap: 12 (ref 5–15)
BILIRUBIN TOTAL: 0.6 mg/dL (ref 0.3–1.2)
BUN: 41 mg/dL — AB (ref 8–23)
CALCIUM: 8.7 mg/dL — AB (ref 8.9–10.3)
CO2: 21 mmol/L — ABNORMAL LOW (ref 22–32)
CREATININE: 1.42 mg/dL — AB (ref 0.61–1.24)
Chloride: 96 mmol/L — ABNORMAL LOW (ref 98–111)
GFR, EST AFRICAN AMERICAN: 56 mL/min — AB (ref >60)
GFR, EST NON AFRICAN AMERICAN: 48 mL/min — AB (ref >60)
Glucose, Bld: 170 mg/dL — ABNORMAL HIGH (ref 70–99)
Potassium: 3.9 mmol/L (ref 3.5–5.1)
Sodium: 129 mmol/L — ABNORMAL LOW (ref 135–145)
Total Protein: 7.1 g/dL (ref 6.5–8.1)

## 2018-03-05 LAB — CBC WITH DIFFERENTIAL/PLATELET
Abs Immature Granulocytes: 0.1 10*3/uL — ABNORMAL HIGH (ref 0.00–0.07)
BASOS ABS: 0 10*3/uL (ref 0.0–0.1)
Basophils Relative: 0 %
EOS ABS: 0.1 10*3/uL (ref 0.0–0.5)
EOS PCT: 1 %
HEMATOCRIT: 33.3 % — AB (ref 39.0–52.0)
Hemoglobin: 11.2 g/dL — ABNORMAL LOW (ref 13.0–17.0)
Immature Granulocytes: 1 %
Lymphocytes Relative: 8 %
Lymphs Abs: 1.3 10*3/uL (ref 0.7–4.0)
MCH: 31.5 pg (ref 26.0–34.0)
MCHC: 33.6 g/dL (ref 30.0–36.0)
MCV: 93.8 fL (ref 80.0–100.0)
MONO ABS: 1 10*3/uL (ref 0.1–1.0)
Monocytes Relative: 6 %
NRBC: 0 % (ref 0.0–0.2)
Neutro Abs: 13.2 10*3/uL — ABNORMAL HIGH (ref 1.7–7.7)
Neutrophils Relative %: 84 %
Platelets: 221 10*3/uL (ref 150–400)
RBC: 3.55 MIL/uL — AB (ref 4.22–5.81)
RDW: 13.6 % (ref 11.5–15.5)
WBC: 15.7 10*3/uL — AB (ref 4.0–10.5)

## 2018-03-05 LAB — URINALYSIS, ROUTINE W REFLEX MICROSCOPIC
Bilirubin Urine: NEGATIVE
Glucose, UA: NEGATIVE mg/dL
Ketones, ur: NEGATIVE mg/dL
Nitrite: NEGATIVE
Protein, ur: NEGATIVE mg/dL
SPECIFIC GRAVITY, URINE: 1.011 (ref 1.005–1.030)
pH: 6 (ref 5.0–8.0)

## 2018-03-05 LAB — I-STAT CG4 LACTIC ACID, ED
Lactic Acid, Venous: 0.97 mmol/L (ref 0.5–1.9)
Lactic Acid, Venous: 1.36 mmol/L (ref 0.5–1.9)

## 2018-03-05 MED ORDER — METOPROLOL SUCCINATE ER 50 MG PO TB24
50.0000 mg | ORAL_TABLET | Freq: Every morning | ORAL | Status: DC
  Administered 2018-03-06: 50 mg via ORAL
  Filled 2018-03-05: qty 1

## 2018-03-05 MED ORDER — ROSUVASTATIN CALCIUM 20 MG PO TABS
40.0000 mg | ORAL_TABLET | Freq: Every day | ORAL | Status: DC
  Administered 2018-03-05: 40 mg via ORAL
  Filled 2018-03-05: qty 2

## 2018-03-05 MED ORDER — ONDANSETRON 4 MG PO TBDP: 4.0000 mg | ORAL_TABLET | Freq: Four times a day (QID) | ORAL | Status: DC | PRN

## 2018-03-05 MED ORDER — TAMSULOSIN HCL 0.4 MG PO CAPS
0.4000 mg | ORAL_CAPSULE | Freq: Every day | ORAL | Status: DC
  Administered 2018-03-05: 0.4 mg via ORAL
  Filled 2018-03-05: qty 1

## 2018-03-05 MED ORDER — MORPHINE SULFATE (PF) 4 MG/ML IV SOLN
4.0000 mg | Freq: Once | INTRAVENOUS | Status: AC
  Administered 2018-03-05: 4 mg via INTRAVENOUS
  Filled 2018-03-05: qty 1

## 2018-03-05 MED ORDER — ONDANSETRON HCL 4 MG/2ML IJ SOLN: 4.0000 mg | Freq: Four times a day (QID) | INTRAMUSCULAR | Status: DC | PRN

## 2018-03-05 MED ORDER — TRAMADOL HCL 50 MG PO TABS
100.0000 mg | ORAL_TABLET | Freq: Two times a day (BID) | ORAL | Status: DC
  Administered 2018-03-05 – 2018-03-06 (×2): 100 mg via ORAL
  Filled 2018-03-05 (×2): qty 2

## 2018-03-05 MED ORDER — IRBESARTAN 75 MG PO TABS
37.5000 mg | ORAL_TABLET | Freq: Every day | ORAL | Status: DC
  Administered 2018-03-06: 37.5 mg via ORAL
  Filled 2018-03-05 (×2): qty 1

## 2018-03-05 MED ORDER — MIRABEGRON ER 25 MG PO TB24
50.0000 mg | ORAL_TABLET | Freq: Every day | ORAL | Status: DC
  Administered 2018-03-06: 50 mg via ORAL
  Filled 2018-03-05: qty 2

## 2018-03-05 MED ORDER — SODIUM CHLORIDE 0.9 % IV SOLN
INTRAVENOUS | Status: DC | PRN
  Administered 2018-03-05: 1000 mL via INTRAVENOUS

## 2018-03-05 MED ORDER — MORPHINE SULFATE (PF) 2 MG/ML IV SOLN
1.0000 mg | INTRAVENOUS | Status: DC | PRN
  Administered 2018-03-06: 1 mg via INTRAVENOUS
  Administered 2018-03-06: 2 mg via INTRAVENOUS
  Filled 2018-03-05 (×2): qty 1

## 2018-03-05 MED ORDER — CEFAZOLIN SODIUM-DEXTROSE 1-4 GM/50ML-% IV SOLN
1.0000 g | Freq: Once | INTRAVENOUS | Status: AC
  Administered 2018-03-05: 1 g via INTRAVENOUS
  Filled 2018-03-05: qty 50

## 2018-03-05 MED ORDER — IOPAMIDOL (ISOVUE-300) INJECTION 61%
100.0000 mL | Freq: Once | INTRAVENOUS | Status: AC | PRN
  Administered 2018-03-05: 100 mL via INTRAVENOUS

## 2018-03-05 MED ORDER — PANTOPRAZOLE SODIUM 40 MG PO TBEC
40.0000 mg | DELAYED_RELEASE_TABLET | Freq: Every day | ORAL | Status: DC
  Administered 2018-03-06: 40 mg via ORAL
  Filled 2018-03-05: qty 1

## 2018-03-05 MED ORDER — SODIUM CHLORIDE 0.9 % IV BOLUS
1000.0000 mL | Freq: Once | INTRAVENOUS | Status: AC
  Administered 2018-03-05: 1000 mL via INTRAVENOUS

## 2018-03-05 MED ORDER — HEPARIN SODIUM (PORCINE) 5000 UNIT/ML IJ SOLN
5000.0000 [IU] | Freq: Three times a day (TID) | INTRAMUSCULAR | Status: DC
  Administered 2018-03-05 – 2018-03-06 (×3): 5000 [IU] via SUBCUTANEOUS
  Filled 2018-03-05 (×3): qty 1

## 2018-03-05 MED ORDER — VANCOMYCIN HCL IN DEXTROSE 1-5 GM/200ML-% IV SOLN
1000.0000 mg | Freq: Once | INTRAVENOUS | Status: AC
  Administered 2018-03-05: 1000 mg via INTRAVENOUS
  Filled 2018-03-05: qty 200

## 2018-03-05 MED ORDER — KCL IN DEXTROSE-NACL 20-5-0.9 MEQ/L-%-% IV SOLN
INTRAVENOUS | Status: DC
  Administered 2018-03-06: via INTRAVENOUS
  Filled 2018-03-05 (×2): qty 1000

## 2018-03-05 NOTE — ED Triage Notes (Signed)
He underwent left inguinal hernia 02-16-2018. He is here today with c/o swelling and erythema in a wide field around, and including surgical site. His penis is also erythematous and edematous. He is in no distress. Labs, including one blood culture are drawn in triage.

## 2018-03-05 NOTE — H&P (Signed)
Johnny Peck is an 72 y.o. male.   Chief Complaint: pain HPI: The patient is a 72 year old white male who is about 2-1/2 weeks status post left inguinal hernia repair with mesh.  He was doing well until the middle of last week when he developed pain in the left abdominal wall and groin area.  The pain has worsened over the last several days.  The pain is been associated with significant redness.  He has had a small amount of drainage of bloody fluid from the edge of the incision.  Past Medical History:  Diagnosis Date  . Arthritis   . GERD (gastroesophageal reflux disease)   . Hypertension   . Urinary incontinence     Past Surgical History:  Procedure Laterality Date  . CATARACT EXTRACTION Right   . HERNIA REPAIR  approx 1990   right inguinal hernia repair   . HERNIA REPAIR Left 2016  . INGUINAL HERNIA REPAIR Left 08/09/2014   Procedure: LAPAROSCOPIC REPAIR OF LEFT INGUINAL HERNIA WITH MESH;  Surgeon: Greer Pickerel, MD;  Location: [facility];  Service: General;  Laterality: Left;  . INGUINAL HERNIA REPAIR Left 02/16/2018   Procedure: OPEN REPAIR RECURRENT LEFT INGUINAL HERNIA  ERAS PATHWAY;  Surgeon: Greer Pickerel, MD;  Location: [facility];  Service: General;  Laterality: Left;  . INSERTION OF MESH Left 02/16/2018   Procedure: INSERTION OF MESH;  Surgeon: Greer Pickerel, MD;  Location: [facility];  Service: General;  Laterality: Left;  . RETINAL DETACHMENT SURGERY Right    [facility]   . TONSILLECTOMY    . URETHRAL DILATION  10/14/2017   ALSO DID VASECTOMY     No family history on file. Social History:  reports that he quit smoking about 34 years ago. He has never used smokeless tobacco. He reports that he drinks alcohol. He reports that he does not use drugs.  Allergies: No Known Allergies   (Not in a hospital admission)  Results for orders placed or performed during the hospital encounter of 03/05/18 (from the past 48 hour(s))  Urinalysis, Routine w reflex microscopic     Status: Abnormal    Collection Time: 03/05/18 12:43 PM  Result Value Ref Range   Color, Urine YELLOW YELLOW   APPearance HAZY (A) CLEAR   Specific Gravity, Urine 1.011 1.005 - 1.030   pH 6.0 5.0 - 8.0   Glucose, UA NEGATIVE NEGATIVE mg/dL   Hgb urine dipstick LARGE (A) NEGATIVE   Bilirubin Urine NEGATIVE NEGATIVE   Ketones, ur NEGATIVE NEGATIVE mg/dL   Protein, ur NEGATIVE NEGATIVE mg/dL   Nitrite NEGATIVE NEGATIVE   Leukocytes, UA LARGE (A) NEGATIVE   RBC / HPF 21-50 0 - 5 RBC/hpf   WBC, UA >50 (H) 0 - 5 WBC/hpf   Bacteria, UA FEW (A) NONE SEEN   Squamous Epithelial / LPF 0-5 0 - 5   Mucus PRESENT     Comment: Performed at [facility], [place] [address] [place], [place] [address]  Comprehensive metabolic panel     Status: Abnormal   Collection Time: 03/05/18 12:58 PM  Result Value Ref Range   Sodium 129 (L) 135 - 145 mmol/L   Potassium 3.9 3.5 - 5.1 mmol/L   Chloride 96 (L) 98 - 111 mmol/L   CO2 21 (L) 22 - 32 mmol/L   Glucose, Bld 170 (H) 70 - 99 mg/dL   BUN 41 (H) 8 - 23 mg/dL   Creatinine, Ser 1.42 (H) 0.61 - 1.24 mg/dL   Calcium 8.7 (  L) 8.9 - 10.3 mg/dL   Total Protein 7.1 6.5 - 8.1 g/dL   Albumin 3.0 (L) 3.5 - 5.0 g/dL   AST 72 (H) 15 - 41 U/L   ALT 56 (H) 0 - 44 U/L   Alkaline Phosphatase 78 38 - 126 U/L   Total Bilirubin 0.6 0.3 - 1.2 mg/dL   GFR calc non Af Amer 48 (L) >60 mL/min   GFR calc Af Amer 56 (L) >60 mL/min    Comment: (NOTE) The eGFR has been calculated using the CKD EPI equation. This calculation has not been validated in all clinical situations. eGFR's persistently <60 mL/min signify possible Chronic Kidney Disease.    Anion gap 12 5 - 15    Comment: Performed at [facility], [place] [address] [place], [place] [address]  CBC with Differential     Status: Abnormal   Collection Time: 03/05/18 12:58 PM  Result Value Ref Range   WBC 15.7 (H) 4.0 - 10.5 K/uL   RBC 3.55 (L) 4.22 - 5.81 MIL/uL   Hemoglobin 11.2 (L) 13.0 - 17.0 g/dL    HCT 33.3 (L) 39.0 - 52.0 %   MCV 93.8 80.0 - 100.0 fL   MCH 31.5 26.0 - 34.0 pg   MCHC 33.6 30.0 - 36.0 g/dL   RDW 13.6 11.5 - 15.5 %   Platelets 221 150 - 400 K/uL   nRBC 0.0 0.0 - 0.2 %   Neutrophils Relative % 84 %   Neutro Abs 13.2 (H) 1.7 - 7.7 K/uL   Lymphocytes Relative 8 %   Lymphs Abs 1.3 0.7 - 4.0 K/uL   Monocytes Relative 6 %   Monocytes Absolute 1.0 0.1 - 1.0 K/uL   Eosinophils Relative 1 %   Eosinophils Absolute 0.1 0.0 - 0.5 K/uL   Basophils Relative 0 %   Basophils Absolute 0.0 0.0 - 0.1 K/uL   Immature Granulocytes 1 %   Abs Immature Granulocytes 0.10 (H) 0.00 - 0.07 K/uL    Comment: Performed at [facility], [place] [address], [place], [place] [address]  I-Stat CG4 Lactic Acid, ED     Status: None   Collection Time: 03/05/18  1:06 PM  Result Value Ref Range   Lactic Acid, Venous 0.97 0.5 - 1.9 mmol/L  I-Stat CG4 Lactic Acid, ED     Status: None   Collection Time: 03/05/18  3:59 PM  Result Value Ref Range   Lactic Acid, Venous 1.36 0.5 - 1.9 mmol/L   Ct Abdomen Pelvis W Contrast  Result Date: 03/05/2018 CLINICAL DATA:  Recent left inguinal hernia repair with swelling and erythema around the surgical site, extending into the penis. EXAM: CT ABDOMEN AND PELVIS WITH CONTRAST TECHNIQUE: Multidetector CT imaging of the abdomen and pelvis was performed using the standard protocol following bolus administration of intravenous contrast. CONTRAST:  15m ISOVUE-300 IOPAMIDOL (ISOVUE-300) INJECTION 61% COMPARISON:  CT abdomen pelvis dated December 25, 2014. FINDINGS: Lower chest: No acute abnormality. New 10 mm pulmonary nodule in the left lower lobe (series 4, image 16). Hepatobiliary: No focal liver abnormality is seen. No gallstones, gallbladder wall thickening, or biliary dilatation. Pancreas: Unremarkable. No pancreatic ductal dilatation or surrounding inflammatory changes. Spleen: Normal in size without focal abnormality. Small splenule near the left  adrenal gland, unchanged Adrenals/Urinary Tract: The adrenal glands are unremarkable. Slight interval increase in size of a 2.7 cm simple cyst in the right kidney. No renal or ureteral calculi. No hydronephrosis. Bladder is unremarkable. Stomach/Bowel: Stomach is  within normal limits. Appendix appears normal. No evidence of bowel wall thickening, distention, or inflammatory changes. Vascular/Lymphatic: Aortic atherosclerosis. Small reactive left inguinal lymph nodes. No enlarged abdominal or pelvic lymph nodes. Reproductive: Normal prostate.  Small bilateral hydroceles. Other: Prominent fluid and inflammatory changes of the superficial left inguinal fat extending inferiorly into the pubic region and superiorly along the left abdominal wall oblique muscles. No discrete fluid collection. No active contrast extravasation. No subcutaneous emphysema. Unchanged small to moderate fat containing right inguinal hernia. No pneumoperitoneum or intra-abdominal free fluid. Musculoskeletal: No acute or significant osseous findings. Old left inferior pubic ramus fracture. IMPRESSION: 1. Prominent superficial soft tissue inflammatory changes in the left groin extending inferiorly into the pubic region and superiorly along the left abdominal wall oblique muscles, most consistent with cellulitis. No discrete abscess. No subcutaneous emphysema to suggest necrotizing infection. No active bleeding. 2. New 10 mm pulmonary nodule in the left lower lobe. Consider one of the following in 3 months for both low-risk and high-risk individuals: (a) repeat chest CT, (b) follow-up PET-CT, or (c) tissue sampling. This recommendation follows the consensus statement: Guidelines for Management of Incidental Pulmonary Nodules Detected on CT Images: From the Fleischner Society 2017; Radiology 2017; 284:228-243. 3.  Aortic atherosclerosis (ICD10-I70.0). Electronically Signed   By: Titus Dubin M.D.   On: 03/05/2018 17:27    Review of Systems   Constitutional: Negative.   HENT: Negative.   Eyes: Negative.   Respiratory: Negative.   Cardiovascular: Negative.   Gastrointestinal: Positive for abdominal pain.  Genitourinary: Positive for flank pain.  Skin: Negative.   Neurological: Negative.   Endo/Heme/Allergies: Negative.   Psychiatric/Behavioral: Negative.     Blood pressure (!) 144/76, pulse 76, temperature 98.8 F (37.1 C), temperature source Oral, resp. rate 15, SpO2 99 %. Physical Exam  Constitutional: He is oriented to person, place, and time. He appears well-developed and well-nourished. No distress.  HENT:  Head: Normocephalic and atraumatic.  Mouth/Throat: No oropharyngeal exudate.  Eyes: Pupils are equal, round, and reactive to light. Conjunctivae and EOM are normal.  Neck: Normal range of motion. Neck supple.  Cardiovascular: Normal rate, regular rhythm and normal heart sounds.  Respiratory: Effort normal and breath sounds normal. No stridor. No respiratory distress.  GI: Soft.  There is significant cellulitis of left groin extending to lower abd wall, flank, and thigh  Musculoskeletal: Normal range of motion. He exhibits edema and tenderness.  Neurological: He is alert and oriented to person, place, and time. Coordination normal.  Skin: Skin is warm and dry. There is erythema.  Psychiatric: He has a normal mood and affect. His behavior is normal. Thought content normal.     Assessment/Plan The patient appears to have developed some significant cellulitis associated with his recent surgery.  At this point I would recommend admission to the hospital with IV hydration and broad-spectrum IV antibiotic therapy.  We will monitor his cellulitis very closely.  If it does not begin to improve he may require opening of the wound and possible removal of the mesh.  I will contact his primary surgeon in the morning to let him know of the admission.  Autumn Messing III, MD 03/05/2018, 8:25 PM

## 2018-03-05 NOTE — ED Notes (Signed)
ED TO INPATIENT HANDOFF REPORT  Name/Age/Gender Johnny Peck 72 y.o. male  Code Status Code Status History    Date Active Date Inactive Code Status Order ID Comments User Context   12/25/2014 1642 12/27/2014 1731 Full Code [id number]  Oswald Hillock, MD Inpatient      Home/SNF/Other Home  Chief Complaint incision bleeding and swollen  Level of Care/Admitting Diagnosis ED Disposition    ED Disposition Condition [facility] Area: [facility] [[id number]]  Level of Care: Med-Surg [16]  Diagnosis: Cellulitis [364680]  Admitting Physician: Jones Skene  Attending Physician: Redmond Pulling, ERIC [phone]  Estimated length of stay: 3 - 4 days  Certification:: I certify this patient will need inpatient services for at least 2 midnights  PT Class (Do Not Modify): Inpatient [101]  PT Acc Code (Do Not Modify): Private [1]       Medical History Past Medical History:  Diagnosis Date  . Arthritis   . GERD (gastroesophageal reflux disease)   . Hypertension   . Urinary incontinence     Allergies No Known Allergies  IV Location/Drains/Wounds Patient Lines/Drains/Airways Status   Active Line/Drains/Airways    Name:   Placement date:   Placement time:   Site:   Days:   Peripheral IV 03/05/18 Left Antecubital   03/05/18    1554    Antecubital   less than 1          Labs/Imaging Results for orders placed or performed during the hospital encounter of 03/05/18 (from the past 48 hour(s))  Urinalysis, Routine w reflex microscopic     Status: Abnormal   Collection Time: 03/05/18 12:43 PM  Result Value Ref Range   Color, Urine YELLOW YELLOW   APPearance HAZY (A) CLEAR   Specific Gravity, Urine 1.011 1.005 - 1.030   pH 6.0 5.0 - 8.0   Glucose, UA NEGATIVE NEGATIVE mg/dL   Hgb urine dipstick LARGE (A) NEGATIVE   Bilirubin Urine NEGATIVE NEGATIVE   Ketones, ur NEGATIVE NEGATIVE mg/dL   Protein, ur NEGATIVE NEGATIVE mg/dL   Nitrite NEGATIVE NEGATIVE    Leukocytes, UA LARGE (A) NEGATIVE   RBC / HPF 21-50 0 - 5 RBC/hpf   WBC, UA >50 (H) 0 - 5 WBC/hpf   Bacteria, UA FEW (A) NONE SEEN   Squamous Epithelial / LPF 0-5 0 - 5   Mucus PRESENT     Comment: Performed at [facility], [place] [address] [place], [place] [address]  Comprehensive metabolic panel     Status: Abnormal   Collection Time: 03/05/18 12:58 PM  Result Value Ref Range   Sodium 129 (L) 135 - 145 mmol/L   Potassium 3.9 3.5 - 5.1 mmol/L   Chloride 96 (L) 98 - 111 mmol/L   CO2 21 (L) 22 - 32 mmol/L   Glucose, Bld 170 (H) 70 - 99 mg/dL   BUN 41 (H) 8 - 23 mg/dL   Creatinine, Ser 1.42 (H) 0.61 - 1.24 mg/dL   Calcium 8.7 (L) 8.9 - 10.3 mg/dL   Total Protein 7.1 6.5 - 8.1 g/dL   Albumin 3.0 (L) 3.5 - 5.0 g/dL   AST 72 (H) 15 - 41 U/L   ALT 56 (H) 0 - 44 U/L   Alkaline Phosphatase 78 38 - 126 U/L   Total Bilirubin 0.6 0.3 - 1.2 mg/dL   GFR calc non Af Amer 48 (L) >60 mL/min   GFR calc Af Amer 56 (L) >60 mL/min  Comment: (NOTE) The eGFR has been calculated using the CKD EPI equation. This calculation has not been validated in all clinical situations. eGFR's persistently <60 mL/min signify possible Chronic Kidney Disease.    Anion gap 12 5 - 15    Comment: Performed at [facility], [place] [address] [place], [place] [address]  CBC with Differential     Status: Abnormal   Collection Time: 03/05/18 12:58 PM  Result Value Ref Range   WBC 15.7 (H) 4.0 - 10.5 K/uL   RBC 3.55 (L) 4.22 - 5.81 MIL/uL   Hemoglobin 11.2 (L) 13.0 - 17.0 g/dL   HCT 33.3 (L) 39.0 - 52.0 %   MCV 93.8 80.0 - 100.0 fL   MCH 31.5 26.0 - 34.0 pg   MCHC 33.6 30.0 - 36.0 g/dL   RDW 13.6 11.5 - 15.5 %   Platelets 221 150 - 400 K/uL   nRBC 0.0 0.0 - 0.2 %   Neutrophils Relative % 84 %   Neutro Abs 13.2 (H) 1.7 - 7.7 K/uL   Lymphocytes Relative 8 %   Lymphs Abs 1.3 0.7 - 4.0 K/uL   Monocytes Relative 6 %   Monocytes Absolute 1.0 0.1 - 1.0 K/uL   Eosinophils Relative  1 %   Eosinophils Absolute 0.1 0.0 - 0.5 K/uL   Basophils Relative 0 %   Basophils Absolute 0.0 0.0 - 0.1 K/uL   Immature Granulocytes 1 %   Abs Immature Granulocytes 0.10 (H) 0.00 - 0.07 K/uL    Comment: Performed at [facility], [place] [address] [place], [place] [address]  I-Stat CG4 Lactic Acid, ED     Status: None   Collection Time: 03/05/18  1:06 PM  Result Value Ref Range   Lactic Acid, Venous 0.97 0.5 - 1.9 mmol/L  I-Stat CG4 Lactic Acid, ED     Status: None   Collection Time: 03/05/18  3:59 PM  Result Value Ref Range   Lactic Acid, Venous 1.36 0.5 - 1.9 mmol/L   Ct Abdomen Pelvis W Contrast  Result Date: 03/05/2018 CLINICAL DATA:  Recent left inguinal hernia repair with swelling and erythema around the surgical site, extending into the penis. EXAM: CT ABDOMEN AND PELVIS WITH CONTRAST TECHNIQUE: Multidetector CT imaging of the abdomen and pelvis was performed using the standard protocol following bolus administration of intravenous contrast. CONTRAST:  165m ISOVUE-300 IOPAMIDOL (ISOVUE-300) INJECTION 61% COMPARISON:  CT abdomen pelvis dated December 25, 2014. FINDINGS: Lower chest: No acute abnormality. New 10 mm pulmonary nodule in the left lower lobe (series 4, image 16). Hepatobiliary: No focal liver abnormality is seen. No gallstones, gallbladder wall thickening, or biliary dilatation. Pancreas: Unremarkable. No pancreatic ductal dilatation or surrounding inflammatory changes. Spleen: Normal in size without focal abnormality. Small splenule near the left adrenal gland, unchanged Adrenals/Urinary Tract: The adrenal glands are unremarkable. Slight interval increase in size of a 2.7 cm simple cyst in the right kidney. No renal or ureteral calculi. No hydronephrosis. Bladder is unremarkable. Stomach/Bowel: Stomach is within normal limits. Appendix appears normal. No evidence of bowel wall thickening, distention, or inflammatory changes. Vascular/Lymphatic: Aortic  atherosclerosis. Small reactive left inguinal lymph nodes. No enlarged abdominal or pelvic lymph nodes. Reproductive: Normal prostate.  Small bilateral hydroceles. Other: Prominent fluid and inflammatory changes of the superficial left inguinal fat extending inferiorly into the pubic region and superiorly along the left abdominal wall oblique muscles. No discrete fluid collection. No active contrast extravasation. No subcutaneous emphysema. Unchanged small to moderate fat containing  right inguinal hernia. No pneumoperitoneum or intra-abdominal free fluid. Musculoskeletal: No acute or significant osseous findings. Old left inferior pubic ramus fracture. IMPRESSION: 1. Prominent superficial soft tissue inflammatory changes in the left groin extending inferiorly into the pubic region and superiorly along the left abdominal wall oblique muscles, most consistent with cellulitis. No discrete abscess. No subcutaneous emphysema to suggest necrotizing infection. No active bleeding. 2. New 10 mm pulmonary nodule in the left lower lobe. Consider one of the following in 3 months for both low-risk and high-risk individuals: (a) repeat chest CT, (b) follow-up PET-CT, or (c) tissue sampling. This recommendation follows the consensus statement: Guidelines for Management of Incidental Pulmonary Nodules Detected on CT Images: From the Fleischner Society 2017; Radiology 2017; 284:228-243. 3.  Aortic atherosclerosis (ICD10-I70.0). Electronically Signed   By: Titus Dubin M.D.   On: 03/05/2018 17:27   EKG Interpretation  Date/Time:  Sunday March 05 2018 15:52:32 EST Ventricular Rate:  70 PR Interval:    QRS Duration: 169 QT Interval:  423 QTC Calculation: 457 R Axis:   -19 Text Interpretation:  Sinus rhythm Prolonged PR interval Right bundle branch block No previous tracing Confirmed by Steinl, Kevin ([id number]) on 03/05/2018 4:18:51 PM   Pending Labs Unresulted Labs (From admission, onward)    Start     Ordered    03/05/18 1926  Urine culture  Add-on,   STAT     03/05/18 1925   03/05/18 1534  Blood culture (routine x 2)  BLOOD CULTURE X 2,   STAT     11 /17/19 1533   Signed and Held  Basic metabolic panel  Tomorrow morning,   R     Signed and Held   Signed and Held  CBC  Tomorrow morning,   R     Signed and Held          Vitals/Pain Today's Vitals   03/05/18 1805 03/05/18 1900 03/05/18 2000 03/05/18 2030  BP:  130/88 (!) 144/76 (!) 150/83  Pulse:  76  86  Resp:  13 15 12   Temp:      TempSrc:      SpO2:  99%  93%  PainSc: 7        Isolation Precautions No active isolations  Medications Medications  0.9 %  sodium chloride infusion (1,000 mLs Intravenous New Bag/Given 03/05/18 1815)  morphine 2 MG/ML injection 1-2 mg (has no administration in time range)  sodium chloride 0.9 % bolus 1,000 mL (0 mLs Intravenous Stopped 03/05/18 1820)  ceFAZolin (ANCEF) IVPB 1 g/50 mL premix (0 g Intravenous Stopped 03/05/18 1820)  morphine 4 MG/ML injection 4 mg (4 mg Intravenous Given 03/05/18 1606)  iopamidol (ISOVUE-300) 61 % injection 100 mL (100 mLs Intravenous Contrast Given 03/05/18 1640)  vancomycin (VANCOCIN) IVPB 1000 mg/200 mL premix (0 mg Intravenous Stopped 03/05/18 1916)  morphine 4 MG/ML injection 4 mg (4 mg Intravenous Given 03/05/18 1806)    Mobility walks

## 2018-03-05 NOTE — ED Provider Notes (Signed)
[facility] DEPT Provider Note   CSN: [id number] Arrival date & time: 03/05/18  1222     History   Chief Complaint Chief Complaint  Patient presents with  . Wound Infection    HPI Rogen Porte is a 72 y.o. male who presents with post-op infection. PMH significant for HTN, GERD, arthritis. The patient states that he had a left hernia repair on 10/31 by Dr. Redmond Pulling. Everything went well post-op until 3 days ago. He started to develop a "rash" over the left groin. He also had some bleeding from his surgical wound. He called their office and they told him to put pressure on it. The redness has progressively worsened and he now has significant swelling of the groin and testicles. The area is red and hard. He's been having fever and chills. No URI symptoms, chest pain, SOB, cough, abdominal pain, N/V/D, urinary symptoms.  HPI  Past Medical History:  Diagnosis Date  . Arthritis   . GERD (gastroesophageal reflux disease)   . Hypertension   . Urinary incontinence     Patient Active Problem List   Diagnosis Date Noted  . AKI (acute kidney injury) ([place]) 12/25/2014  . Hypertension 12/25/2014  . Ileus ([place]) 12/25/2014  . 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 CATARACT EXTRACTION Right   . HERNIA REPAIR  approx 1990   right inguinal hernia repair   . HERNIA REPAIR Left 2016  . INGUINAL HERNIA REPAIR Left 08/09/2014   Procedure: LAPAROSCOPIC REPAIR OF LEFT INGUINAL HERNIA WITH MESH;  Surgeon: Greer Pickerel, MD;  Location: [facility] OR[facility];  Service: General;  Laterality: Left;  . INGUINAL HERNIA REPAIR Left 02/16/2018   Procedure: OPEN REPAIR RECURRENT LEFT INGUINAL HERNIA  ERAS PATHWAY;  Surgeon: Greer Pickerel, MD;  Location: [facility];  Service: General;  Laterality: Left;  . INSERTION OF MESH Left 02/16/2018   Procedure: INSERTION OF MESH;  Surgeon: Greer Pickerel, MD;  Location: [facility];  Service: General;   Laterality: Left;  . RETINAL DETACHMENT SURGERY Right    [facility]   . TONSILLECTOMY    . URETHRAL DILATION  10/14/2017   ALSO DID VASECTOMY         Home Medications    Prior to Admission medications   Medication Sig Start Date End Date Taking? Authorizing Provider  aspirin EC 81 MG tablet Take 81 mg by mouth at bedtime.    [provider]  Cholecalciferol (VITAMIN D3) 5000 units CAPS Take 5,000 Units by mouth daily.    [provider]  Coenzyme Q10 (COQ10) 200 MG CAPS Take 200 mg by mouth 2 (two) times daily.    [provider]  CRANBERRY PO Take 1 tablet by mouth 2 (two) times daily.    [provider]  diphenhydramine-acetaminophen (TYLENOL PM) 25-500 MG TABS tablet Take 2 tablets by mouth at bedtime as needed (sleep).    [provider]  meloxicam (MOBIC) 15 MG tablet Take 15 mg by mouth at bedtime.    [provider]  metoprolol succinate (TOPROL-XL) 50 MG 24 hr tablet Take 50 mg by mouth every morning. Take with or immediately following a meal.    [provider]  mirabegron ER (MYRBETRIQ) 50 MG TB24 tablet Take 50 mg by mouth daily.    [provider]  Misc Natural Products (PROSTATE HEALTH PO) Take 3 tablets by mouth daily.    [provider]  Multiple Vitamin (  MULTIVITAMIN WITH MINERALS) TABS tablet Take 5 tablets by mouth 2 (two) times daily.     [provider]  Multiple Vitamins-Minerals (OCULAR VITAMINS) TABS Take 1 tablet by mouth daily.    [provider]  Multiple Vitamins-Minerals (VISION PLUS PO) Take 1 tablet by mouth daily.    [provider]  Nutritional Supplements (WELLNESS ESSENTIALS FOR JOINT PO) Take 4 tablets by mouth daily.     [provider]  OIL OF OREGANO PO Cut open 1 capsule and apply topically as needed for infected pimples    [provider]  olmesartan (BENICAR) 20 MG tablet Take 20 mg by mouth at bedtime.    [provider]  omeprazole (PRILOSEC) 20 MG capsule Take 20 mg by mouth every morning.    [provider]  Probiotic CAPS Take 1 capsule by mouth daily.    [provider]  psyllium (METAMUCIL) 58.6 % packet Take 1 packet by mouth daily as needed (constipation).     [provider]  RESVERATROL PO Take 2 tablets by mouth daily.    [provider]  rosuvastatin (CRESTOR) 40 MG tablet Take 40 mg by mouth at bedtime.     [provider]  tadalafil (CIALIS) 5 MG tablet Take 5 mg by mouth daily.     [provider]  tamsulosin (FLOMAX) 0.4 MG CAPS capsule Take 0.4 mg by mouth at bedtime.     [provider]  traMADol (ULTRAM) 50 MG tablet Take 50-150 mg by mouth 2 (two) times daily.     [provider]    Family History No family history on file.  Social History Social History   Tobacco Use  . Smoking status: Former Smoker    Last attempt to quit: 12/25/1983    Years since quitting: 34.2  . Smokeless tobacco: Never Used  Substance Use Topics  . Alcohol use: Yes    Comment: occasional   . Drug use: No     Allergies   Patient has no known allergies.   Review of Systems Review of Systems  Constitutional: Positive for chills and fever.  Respiratory: Negative for shortness of breath.   Cardiovascular: Negative for chest pain.  Gastrointestinal: Negative for abdominal pain, diarrhea, nausea and vomiting.  Genitourinary: Positive for scrotal swelling. Negative for difficulty urinating, discharge, dysuria, penile pain, penile swelling and testicular pain.  Skin: Positive for color change and wound.  All other systems reviewed and are negative.    Physical Exam Updated Vital Signs BP 133/75   Pulse 73   Temp 98.8 F (37.1 C) (Oral)   Resp 17   SpO2 100%   Physical Exam  Constitutional: He is oriented to person, place, and time. He appears well-developed and well-nourished. No distress.  HENT:  Head:  Normocephalic and atraumatic.  Eyes: Pupils are equal, round, and reactive to light. Conjunctivae are normal. Right eye exhibits no discharge. Left eye exhibits no discharge. No scleral icterus.  Neck: Normal range of motion.  Cardiovascular: Normal rate and regular rhythm.  Pulmonary/Chest: Effort normal and breath sounds normal. No respiratory distress.  Abdominal: Soft. Bowel sounds are normal. He exhibits no distension. There is no tenderness.  Genitourinary:  Genitourinary Comments: No inguinal lymphadenopathy or inguinal hernia noted. Healing surgical wound from inguinal repair with a small area of wound dehiscence over the medial aspect and a small amount of blood oozing from the area. No obvious purulent drainage. There is diffuse redness, induration, and tenderness  over the left pelvis and spreading to the right lower abdomen and left flank. The testicles are edematous but non-tender. No obvious discharge noted from the penis. Chaperone present during exam.  Neurological: He is alert and oriented to person, place, and time.  Skin: Skin is warm and dry.  Psychiatric: He has a normal mood and affect. His behavior is normal.  Nursing note and vitals reviewed.    ED Treatments / Results  Labs (all labs ordered are listed, but only abnormal results are displayed) Labs Reviewed  COMPREHENSIVE METABOLIC PANEL - Abnormal; Notable for the following components:      Result Value   Sodium 129 (*)    Chloride 96 (*)    CO2 21 (*)    Glucose, Bld 170 (*)    BUN 41 (*)    Creatinine, Ser 1.42 (*)    Calcium 8.7 (*)    Albumin 3.0 (*)    AST 72 (*)    ALT 56 (*)    GFR calc non Af Amer 48 (*)    GFR calc Af Amer 56 (*)    All other components within normal limits  CBC WITH DIFFERENTIAL/PLATELET - Abnormal; Notable for the following components:   WBC 15.7 (*)    RBC 3.55 (*)    Hemoglobin 11.2 (*)    HCT 33.3 (*)    Neutro Abs 13.2 (*)    Abs Immature Granulocytes 0.10 (*)    All  other components within normal limits  URINALYSIS, ROUTINE W REFLEX MICROSCOPIC - Abnormal; Notable for the following components:   APPearance HAZY (*)    Hgb urine dipstick LARGE (*)    Leukocytes, UA LARGE (*)    WBC, UA >50 (*)    Bacteria, UA FEW (*)    All other components within normal limits  CULTURE, BLOOD (ROUTINE X 2)  CULTURE, BLOOD (ROUTINE X 2)  I-STAT CG4 LACTIC ACID, ED  I-STAT CG4 LACTIC ACID, ED    EKG EKG Interpretation  Date/Time:  Sunday March 05 2018 15:52:32 EST Ventricular Rate:  70 PR Interval:    QRS Duration: 169 QT Interval:  423 QTC Calculation: 457 R Axis:   -19 Text Interpretation:  Sinus rhythm Prolonged PR interval Right bundle branch block No previous tracing Confirmed by Lajean Saver [phone]) on 03/05/2018 4:18:51 PM   Radiology Ct Abdomen Pelvis W Contrast  Result Date: 03/05/2018 CLINICAL DATA:  Recent left inguinal hernia repair with swelling and erythema around the surgical site, extending into the penis. EXAM: CT ABDOMEN AND PELVIS WITH CONTRAST TECHNIQUE: Multidetector CT imaging of the abdomen and pelvis was performed using the standard protocol following bolus administration of intravenous contrast. CONTRAST:  141mL ISOVUE-300 IOPAMIDOL (ISOVUE-300) INJECTION 61% COMPARISON:  CT abdomen pelvis dated December 25, 2014. FINDINGS: Lower chest: No acute abnormality. New 10 mm pulmonary nodule in the left lower lobe (series 4, image 16). Hepatobiliary: No focal liver abnormality is seen. No gallstones, gallbladder wall thickening, or biliary dilatation. Pancreas: Unremarkable. No pancreatic ductal dilatation or surrounding inflammatory changes. Spleen: Normal in size without focal abnormality. Small splenule near the left adrenal gland, unchanged Adrenals/Urinary Tract: The adrenal glands are unremarkable. Slight interval increase in size of a 2.7 cm simple cyst in the right kidney. No renal or ureteral calculi. No hydronephrosis. Bladder is  unremarkable. Stomach/Bowel: Stomach is within normal limits. Appendix appears normal. No evidence of bowel wall thickening, distention, or inflammatory changes. Vascular/Lymphatic: Aortic atherosclerosis. Small reactive left inguinal lymph nodes. No enlarged  abdominal or pelvic lymph nodes. Reproductive: Normal prostate.  Small bilateral hydroceles. Other: Prominent fluid and inflammatory changes of the superficial left inguinal fat extending inferiorly into the pubic region and superiorly along the left abdominal wall oblique muscles. No discrete fluid collection. No active contrast extravasation. No subcutaneous emphysema. Unchanged small to moderate fat containing right inguinal hernia. No pneumoperitoneum or intra-abdominal free fluid. Musculoskeletal: No acute or significant osseous findings. Old left inferior pubic ramus fracture. IMPRESSION: 1. Prominent superficial soft tissue inflammatory changes in the left groin extending inferiorly into the pubic region and superiorly along the left abdominal wall oblique muscles, most consistent with cellulitis. No discrete abscess. No subcutaneous emphysema to suggest necrotizing infection. No active bleeding. 2. New 10 mm pulmonary nodule in the left lower lobe. Consider one of the following in 3 months for both low-risk and high-risk individuals: (a) repeat chest CT, (b) follow-up PET-CT, or (c) tissue sampling. This recommendation follows the consensus statement: Guidelines for Management of Incidental Pulmonary Nodules Detected on CT Images: From the Fleischner Society 2017; Radiology 2017; 284:228-243. 3.  Aortic atherosclerosis (ICD10-I70.0). Electronically Signed   By: Titus Dubin M.D.   On: 03/05/2018 17:27    Procedures Procedures (including critical care time)  Medications Ordered in ED Medications  vancomycin (VANCOCIN) IVPB 1000 mg/200 mL premix (1,000 mg Intravenous New Bag/Given 03/05/18 1816)  0.9 %  sodium chloride infusion (1,000 mLs  Intravenous New Bag/Given 03/05/18 1815)  sodium chloride 0.9 % bolus 1,000 mL (0 mLs Intravenous Stopped 03/05/18 1820)  ceFAZolin (ANCEF) IVPB 1 g/50 mL premix (0 g Intravenous Stopped 03/05/18 1820)  morphine 4 MG/ML injection 4 mg (4 mg Intravenous Given 03/05/18 1606)  iopamidol (ISOVUE-300) 61 % injection 100 mL (100 mLs Intravenous Contrast Given 03/05/18 1640)  morphine 4 MG/ML injection 4 mg (4 mg Intravenous Given 03/05/18 1806)     Initial Impression / Assessment and Plan / ED Course  I have reviewed the triage vital signs and the nursing notes.  Pertinent labs & imaging results that were available during my care of the patient were reviewed by me and considered in my medical decision making (see chart for details).  72 year old male presents with worsening redness, swelling, and pain over the L groin which is progressively worsening. Vitals are normal. CBC shows leukocytosis of 15.7 and anemia (11.2). CMP shows hyponatremia (129), hyperglycemia (170), AKI (SCr is .96 up from 1.42), and elevated LFTs. Interestingly UA shows large hgb, large leukocytes, and >50 WBC with few bacteria. He does not have urinary symptoms but will send off a culture. Lactic acid is normal. CT scan was obtained to evaluate for underlying abscess. This showed only cellulitis. Shared visit with Dr. Ashok Cordia. Will discuss with surgery.  6:20 PM Spoke with Dr. Marlou Starks who will come to see the patient.   Final Clinical Impressions(s) / ED Diagnoses   Final diagnoses:  Cellulitis, wound, post-operative  AKI (acute kidney injury) ([place])  Anemia, unspecified type    ED Discharge Orders    None       Recardo Evangelist, PA-C 03/05/18 [place], Dan, DO 03/06/18 1016

## 2018-03-06 ENCOUNTER — Inpatient Hospital Stay (HOSPITAL_COMMUNITY): Payer: Medicare Other | Admitting: Anesthesiology

## 2018-03-06 ENCOUNTER — Encounter (HOSPITAL_COMMUNITY)
Admission: EM | Disposition: A | Discharge status: Home Health | Payer: Self-pay | Source: Home / Self Care | Attending: General Surgery

## 2018-03-06 ENCOUNTER — Encounter (HOSPITAL_COMMUNITY): Payer: Self-pay

## 2018-03-06 HISTORY — PX: IRRIGATION AND DEBRIDEMENT ABSCESS: SHX5252

## 2018-03-06 LAB — BASIC METABOLIC PANEL
Anion gap: 8 (ref 5–15)
BUN: 23 mg/dL (ref 8–23)
CHLORIDE: 99 mmol/L (ref 98–111)
CO2: 25 mmol/L (ref 22–32)
Calcium: 8.4 mg/dL — ABNORMAL LOW (ref 8.9–10.3)
Creatinine, Ser: 1.17 mg/dL (ref 0.61–1.24)
GFR calc non Af Amer: >60 mL/min (ref >60)
Glucose, Bld: 228 mg/dL — ABNORMAL HIGH (ref 70–99)
POTASSIUM: 4 mmol/L (ref 3.5–5.1)
SODIUM: 132 mmol/L — AB (ref 135–145)

## 2018-03-06 LAB — CBC
HEMATOCRIT: 30.5 % — AB (ref 39.0–52.0)
Hemoglobin: 10.1 g/dL — ABNORMAL LOW (ref 13.0–17.0)
MCH: 31.5 pg (ref 26.0–34.0)
MCHC: 33.1 g/dL (ref 30.0–36.0)
MCV: 95 fL (ref 80.0–100.0)
NRBC: 0 % (ref 0.0–0.2)
Platelets: 228 10*3/uL (ref 150–400)
RBC: 3.21 MIL/uL — AB (ref 4.22–5.81)
RDW: 13.7 % (ref 11.5–15.5)
WBC: 13.3 10*3/uL — AB (ref 4.0–10.5)

## 2018-03-06 SURGERY — IRRIGATION AND DEBRIDEMENT ABSCESS
Anesthesia: General | Site: Groin | Laterality: Left

## 2018-03-06 MED ORDER — VANCOMYCIN HCL 10 G IV SOLR
1500.0000 mg | INTRAVENOUS | Status: DC
  Filled 2018-03-06: qty 1500

## 2018-03-06 MED ORDER — FENTANYL CITRATE (PF) 100 MCG/2ML IJ SOLN
INTRAMUSCULAR | Status: AC
  Filled 2018-03-06: qty 2

## 2018-03-06 MED ORDER — DEXAMETHASONE SODIUM PHOSPHATE 10 MG/ML IJ SOLN
INTRAMUSCULAR | Status: AC
  Filled 2018-03-06: qty 1

## 2018-03-06 MED ORDER — LACTATED RINGERS IV SOLN
INTRAVENOUS | Status: DC
  Administered 2018-03-06: 15:00:00 via INTRAVENOUS

## 2018-03-06 MED ORDER — PROMETHAZINE HCL 25 MG/ML IJ SOLN: 6.2500 mg | INTRAMUSCULAR | Status: DC | PRN

## 2018-03-06 MED ORDER — ONDANSETRON HCL 4 MG/2ML IJ SOLN: 4.0000 mg | Freq: Four times a day (QID) | INTRAMUSCULAR | Status: DC | PRN

## 2018-03-06 MED ORDER — POTASSIUM CHLORIDE IN NACL 20-0.45 MEQ/L-% IV SOLN
INTRAVENOUS | Status: DC
  Administered 2018-03-06 – 2018-03-11 (×3): via INTRAVENOUS
  Filled 2018-03-06 (×6): qty 1000

## 2018-03-06 MED ORDER — DIPHENHYDRAMINE HCL 12.5 MG/5ML PO ELIX: 12.5000 mg | ORAL_SOLUTION | Freq: Four times a day (QID) | ORAL | Status: DC | PRN

## 2018-03-06 MED ORDER — ONDANSETRON 4 MG PO TBDP: 4.0000 mg | ORAL_TABLET | Freq: Four times a day (QID) | ORAL | Status: DC | PRN

## 2018-03-06 MED ORDER — ENOXAPARIN SODIUM 40 MG/0.4ML ~~LOC~~ SOLN
40.0000 mg | SUBCUTANEOUS | Status: DC
  Administered 2018-03-07 – 2018-03-13 (×7): 40 mg via SUBCUTANEOUS
  Filled 2018-03-06 (×7): qty 0.4

## 2018-03-06 MED ORDER — CEFAZOLIN SODIUM-DEXTROSE 1-4 GM/50ML-% IV SOLN
1.0000 g | Freq: Three times a day (TID) | INTRAVENOUS | Status: DC
  Administered 2018-03-06 (×2): 1 g via INTRAVENOUS
  Filled 2018-03-06 (×3): qty 50

## 2018-03-06 MED ORDER — 0.9 % SODIUM CHLORIDE (POUR BTL) OPTIME
TOPICAL | Status: DC | PRN
  Administered 2018-03-06: 1000 mL

## 2018-03-06 MED ORDER — PHENYLEPHRINE 40 MCG/ML (10ML) SYRINGE FOR IV PUSH (FOR BLOOD PRESSURE SUPPORT)
PREFILLED_SYRINGE | INTRAVENOUS | Status: DC | PRN
  Administered 2018-03-06: 80 ug via INTRAVENOUS

## 2018-03-06 MED ORDER — PROPOFOL 10 MG/ML IV BOLUS
INTRAVENOUS | Status: AC
  Filled 2018-03-06: qty 20

## 2018-03-06 MED ORDER — FENTANYL CITRATE (PF) 100 MCG/2ML IJ SOLN
25.0000 ug | INTRAMUSCULAR | Status: DC | PRN
  Administered 2018-03-06 (×2): 25 ug via INTRAVENOUS
  Administered 2018-03-06: 50 ug via INTRAVENOUS

## 2018-03-06 MED ORDER — VANCOMYCIN HCL 10 G IV SOLR
1750.0000 mg | Freq: Once | INTRAVENOUS | Status: AC
  Administered 2018-03-06: 1750 mg via INTRAVENOUS
  Filled 2018-03-06: qty 1750

## 2018-03-06 MED ORDER — LIDOCAINE 2% (20 MG/ML) 5 ML SYRINGE
INTRAMUSCULAR | Status: AC
  Filled 2018-03-06: qty 5

## 2018-03-06 MED ORDER — DIPHENHYDRAMINE HCL 50 MG/ML IJ SOLN: 12.5000 mg | Freq: Four times a day (QID) | INTRAMUSCULAR | Status: DC | PRN

## 2018-03-06 MED ORDER — SODIUM CHLORIDE 0.9 % IR SOLN
Status: DC | PRN
  Administered 2018-03-06: 3000 mL

## 2018-03-06 MED ORDER — OXYCODONE HCL 5 MG PO TABS
5.0000 mg | ORAL_TABLET | ORAL | Status: DC | PRN
  Administered 2018-03-06 – 2018-03-07 (×3): 5 mg via ORAL
  Administered 2018-03-07 (×2): 10 mg via ORAL
  Administered 2018-03-07: 5 mg via ORAL
  Administered 2018-03-07 – 2018-03-08 (×7): 10 mg via ORAL
  Administered 2018-03-09: 5 mg via ORAL
  Administered 2018-03-09 (×3): 10 mg via ORAL
  Administered 2018-03-09: 5 mg via ORAL
  Administered 2018-03-09 – 2018-03-10 (×4): 10 mg via ORAL
  Administered 2018-03-10: 5 mg via ORAL
  Administered 2018-03-10 – 2018-03-13 (×13): 10 mg via ORAL
  Administered 2018-03-13: 5 mg via ORAL
  Administered 2018-03-13: 10 mg via ORAL
  Filled 2018-03-06 (×3): qty 2
  Filled 2018-03-06: qty 1
  Filled 2018-03-06 (×2): qty 2
  Filled 2018-03-06: qty 1
  Filled 2018-03-06 (×9): qty 2
  Filled 2018-03-06 (×2): qty 1
  Filled 2018-03-06 (×3): qty 2
  Filled 2018-03-06: qty 1
  Filled 2018-03-06 (×4): qty 2
  Filled 2018-03-06: qty 1
  Filled 2018-03-06 (×2): qty 2
  Filled 2018-03-06: qty 1
  Filled 2018-03-06 (×4): qty 2
  Filled 2018-03-06: qty 1
  Filled 2018-03-06 (×4): qty 2

## 2018-03-06 MED ORDER — DEXAMETHASONE SODIUM PHOSPHATE 10 MG/ML IJ SOLN
INTRAMUSCULAR | Status: DC | PRN
  Administered 2018-03-06: 5 mg via INTRAVENOUS

## 2018-03-06 MED ORDER — ACETAMINOPHEN 325 MG PO TABS
650.0000 mg | ORAL_TABLET | Freq: Four times a day (QID) | ORAL | Status: DC | PRN
  Administered 2018-03-06: 650 mg via ORAL
  Filled 2018-03-06: qty 2

## 2018-03-06 MED ORDER — FENTANYL CITRATE (PF) 100 MCG/2ML IJ SOLN
INTRAMUSCULAR | Status: DC | PRN
  Administered 2018-03-06: 50 ug via INTRAVENOUS
  Administered 2018-03-06 (×2): 25 ug via INTRAVENOUS
  Administered 2018-03-06: 50 ug via INTRAVENOUS
  Administered 2018-03-06 (×2): 25 ug via INTRAVENOUS

## 2018-03-06 MED ORDER — ACETAMINOPHEN 325 MG PO TABS
650.0000 mg | ORAL_TABLET | Freq: Four times a day (QID) | ORAL | Status: DC
  Administered 2018-03-06 – 2018-03-13 (×27): 650 mg via ORAL
  Filled 2018-03-06 (×27): qty 2

## 2018-03-06 MED ORDER — DOCUSATE SODIUM 100 MG PO CAPS
100.0000 mg | ORAL_CAPSULE | Freq: Two times a day (BID) | ORAL | Status: DC
  Administered 2018-03-06 – 2018-03-13 (×14): 100 mg via ORAL
  Filled 2018-03-06 (×14): qty 1

## 2018-03-06 MED ORDER — LIDOCAINE 2% (20 MG/ML) 5 ML SYRINGE
INTRAMUSCULAR | Status: DC | PRN
  Administered 2018-03-06: 80 mg via INTRAVENOUS

## 2018-03-06 MED ORDER — PROPOFOL 10 MG/ML IV BOLUS
INTRAVENOUS | Status: DC | PRN
  Administered 2018-03-06: 200 mg via INTRAVENOUS

## 2018-03-06 MED ORDER — ONDANSETRON HCL 4 MG/2ML IJ SOLN
INTRAMUSCULAR | Status: DC | PRN
  Administered 2018-03-06: 4 mg via INTRAVENOUS

## 2018-03-06 SURGICAL SUPPLY — 37 items
BNDG GAUZE ELAST 4 BULKY (GAUZE/BANDAGES/DRESSINGS) ×2 IMPLANT
COVER SURGICAL LIGHT HANDLE (MISCELLANEOUS) ×2 IMPLANT
COVER WAND RF STERILE (DRAPES) ×2 IMPLANT
DECANTER SPIKE VIAL GLASS SM (MISCELLANEOUS) IMPLANT
DERMABOND ADVANCED (GAUZE/BANDAGES/DRESSINGS)
DERMABOND ADVANCED .7 DNX12 (GAUZE/BANDAGES/DRESSINGS) IMPLANT
DRAPE LAPAROSCOPIC ABDOMINAL (DRAPES) IMPLANT
DRAPE LAPAROTOMY T 102X78X121 (DRAPES) IMPLANT
DRAPE LAPAROTOMY T 98X78 PEDS (DRAPES) ×2 IMPLANT
DRAPE LAPAROTOMY TRNSV 102X78 (DRAPE) IMPLANT
DRAPE SHEET LG 3/4 BI-LAMINATE (DRAPES) IMPLANT
DRAPE UTILITY XL STRL (DRAPES) IMPLANT
DRSG PAD ABDOMINAL 8X10 ST (GAUZE/BANDAGES/DRESSINGS) IMPLANT
ELECT REM PT RETURN 15FT ADLT (MISCELLANEOUS) ×2 IMPLANT
GAUZE PACKING IODOFORM 1/4X15 (GAUZE/BANDAGES/DRESSINGS) IMPLANT
GAUZE SPONGE 4X4 12PLY STRL (GAUZE/BANDAGES/DRESSINGS) ×2 IMPLANT
GLOVE BIO SURGEON STRL SZ7.5 (GLOVE) ×2 IMPLANT
GLOVE INDICATOR 8.0 STRL GRN (GLOVE) ×2 IMPLANT
GOWN STRL REUS W/TWL XL LVL3 (GOWN DISPOSABLE) ×4 IMPLANT
HANDPIECE INTERPULSE COAX TIP (DISPOSABLE) ×1
KIT BASIN OR (CUSTOM PROCEDURE TRAY) ×2 IMPLANT
MARKER SKIN DUAL TIP RULER LAB (MISCELLANEOUS) IMPLANT
NEEDLE HYPO 25X1 1.5 SAFETY (NEEDLE) IMPLANT
PACK GENERAL/GYN (CUSTOM PROCEDURE TRAY) ×2 IMPLANT
PAD ABD 7.5X8 STRL (GAUZE/BANDAGES/DRESSINGS) ×2 IMPLANT
SET HNDPC FAN SPRY TIP SCT (DISPOSABLE) ×1 IMPLANT
SPONGE LAP 18X18 RF (DISPOSABLE) IMPLANT
SPONGE LAP 4X18 RFD (DISPOSABLE) IMPLANT
STAPLER VISISTAT 35W (STAPLE) IMPLANT
SUT MNCRL AB 4-0 PS2 18 (SUTURE) IMPLANT
SUT VIC AB 3-0 SH 18 (SUTURE) IMPLANT
SWAB COLLECTION DEVICE MRSA (MISCELLANEOUS) IMPLANT
SWAB CULTURE ESWAB REG 1ML (MISCELLANEOUS) ×2 IMPLANT
SYR CONTROL 10ML LL (SYRINGE) ×2 IMPLANT
TAPE CLOTH SURG 6X10 WHT LF (GAUZE/BANDAGES/DRESSINGS) ×2 IMPLANT
TOWEL OR 17X26 10 PK STRL BLUE (TOWEL DISPOSABLE) ×2 IMPLANT
TOWEL OR NON WOVEN STRL DISP B (DISPOSABLE) ×2 IMPLANT

## 2018-03-06 NOTE — Progress Notes (Signed)
Pharmacy Antibiotic Note  Johnny Peck is a 72 y.o. male with PMH of left inguinal hernia repair with mesh on 02/16/2018 admitted on 03/05/2018 with cellulitis. Patient underwent I&D of infected left inguinal incision today. Pharmacy has been consulted for Vancomycin dosing post-operatively.  Plan: Vancomycin 1750mg  IV x 1 now, then 1500mg  IV q24h. Plan for Vancomycin peak/trough levels at steady state, as indicated. Monitor renal function, cultures, clinical course.   Height: 5\' 10"  (177.8 cm) Weight: 194 lb 0.1 oz (88 kg) IBW/kg (Calculated) : 73  Temp (24hrs), Avg:99 F (37.2 C), Min:98.3 F (36.8 C), Max:100.6 F (38.1 C)  Recent Labs  Lab 03/05/18 1258 03/05/18 1306 03/05/18 1559 03/06/18 0404  WBC 15.7*  --   --  13.3*  CREATININE 1.42*  --   --  1.17  LATICACIDVEN  --  0.97 1.36  --     Estimated Creatinine Clearance: 64.7 mL/min (by C-G formula based on SCr of 1.17 mg/dL).    No Known Allergies  Antimicrobials this admission: 11/17 Cefazolin, Vancomycin x 1 11/18 Cefazolin >> 11/18 11/18 Vancomycin >>  Dose adjustments this admission: --  Microbiology results: 11/17 BCx: sent 11/17 UCx: sent  11/18 surgical/deep wound cx: sent   Thank you for allowing pharmacy to be a part of this patient's care.   Lindell Spar, PharmD, BCPS Pager: [phone] 03/06/2018 5:34 PM

## 2018-03-06 NOTE — Anesthesia Procedure Notes (Signed)
Procedure Name: LMA Insertion Date/Time: 03/06/2018 3:20 PM Performed by: West Pugh, CRNA Pre-anesthesia Checklist: Patient identified, Emergency Drugs available, Suction available, Patient being monitored and Timeout performed Patient Re-evaluated:Patient Re-evaluated prior to induction Oxygen Delivery Method: Circle system utilized Preoxygenation: Pre-oxygenation with 100% oxygen Induction Type: IV induction LMA: LMA with gastric port inserted LMA Size: 5.0 Number of attempts: 1 Placement Confirmation: positive ETCO2 and breath sounds checked- equal and bilateral Tube secured with: Tape Dental Injury: Teeth and Oropharynx as per pre-operative assessment

## 2018-03-06 NOTE — Op Note (Signed)
03/05/2018 - 03/06/2018  3:45 PM  PATIENT:  Johnny Peck  72 y.o. male  PRE-OPERATIVE DIAGNOSIS:  infected left inguinal incision; history of open repair of left recurrent indirect inguinal hernia with mesh October 31  POST-OPERATIVE DIAGNOSIS: same  PROCEDURE:  Procedure(s): Incision and drainage and  pulsatile lavage of infected LEFT INGUINAL INCISION  SURGEON:  Surgeon(s): Greer Pickerel, MD  ASSISTANTS: [employer] [employer]   ANESTHESIA:   general  DRAINS: none   LOCAL MEDICATIONS USED:  NONE  SPECIMEN:  Source of Specimen:  Aerobic, anaerobic, Gram stain of wound  DISPOSITION OF SPECIMEN:  Microbiology  COUNTS:  YES  INDICATION FOR PROCEDURE: Patient is a 71 year old gentleman who underwent an open repair of a recurrent left inguinal hernia on October 31.  It was a recurrent indirect hernia.  Mesh was used.  He was readmitted yesterday with extensive cellulitis and a leukocytosis.  CT scan did not demonstrate any evidence of a fluid collection.  There is no evidence of intra-abdominal issues.  On rounds today when I examined the patient when I pressed on the area around the incision I could clearly express seropurulent fluid.  His induration extended for approximately 20 to 30 cm inferior and lateral toward his left hip as well as toward his right hip.  The cellulitis was also quite extensive.  Because there is purulent drainage I recommended incision and drainage and washout in the OR.  We discussed the possibility of mesh removal.  We also discussed risk and benefits of the procedure please see chart for additional details  PROCEDURE: After obtaining informed consent and marking the area with the patient agreeing on the operative site he was taken to the [facility].  Sequential compression devices were placed.  General LMA anesthesia was established.  The area was prepped and draped in the usual standard surgical fashion.  This time Betadine was used.  He  was on broad-spectrum scheduled therapeutic IV antibiotics.  A surgical timeout was performed.  The most medial aspect of the incision had spontaneously opened.  I ended up opening this up further by gently pulling the incision apart.  There is frank amount of seropurulent drainage.  Cultures were obtained.  There is a fair amount of fat necrosis.  I could see the Vicryl sutures in the Scarpa's fascia.  There were a few left intact.  These were taken down.  I could not visualize exposed mesh.  It appeared that the external oblique aponeurosis closure was still intact.  The cavity did extend out laterally 4 cm toward the left hip area.  Also extended inferior medially a little bit.  I irrigated the wound with 3 L of saline using pulsatile lavage.  Again after washing out the area I could not see any exposed overt signs of mesh.  The external oblique aponeurosis appeared intact.  Because it was intact and there is no sign of deeper infection on the CT scan I decided to stop the procedure at this point.  The cavity was packed with moistened gauze and covered with dry gauze and tape.  All counts were correct x2.  There were no immediate complications.  PLAN OF CARE: Admit to inpatient   PATIENT DISPOSITION:  PACU - hemodynamically stable.   Delay start of Pharmacological VTE agent (>24hrs) due to surgical blood loss or risk of bleeding:  no  [name]. Redmond Pulling, MD, FACS General, Bariatric, & Minimally Invasive Surgery [facility], [place]

## 2018-03-06 NOTE — Anesthesia Postprocedure Evaluation (Addendum)
Anesthesia Post Note  Patient: Johnny Peck  Procedure(s) Performed: IRRIGATION AND DEBRIDEMENT LEFT INGUINAL INCISION (Left Groin)     Patient location during evaluation: PACU Anesthesia Type: General Level of consciousness: sedated Pain management: pain level controlled Vital Signs Assessment: post-procedure vital signs reviewed and stable Respiratory status: spontaneous breathing and respiratory function stable Cardiovascular status: stable Postop Assessment: no apparent nausea or vomiting Anesthetic complications: no    Last Pain:  Vitals:   03/06/18 1645  TempSrc:   PainSc: 3                  Garey Alleva DANIEL

## 2018-03-06 NOTE — Transfer of Care (Signed)
Immediate Anesthesia Transfer of Care Note  Patient: Johnny Peck  Procedure(s) Performed: IRRIGATION AND DEBRIDEMENT LEFT INGUINAL INCISION (Left Groin)  Patient Location: PACU  Anesthesia Type:General  Level of Consciousness: awake, alert , oriented and patient cooperative  Airway & Oxygen Therapy: Patient Spontanous Breathing and Patient connected to face mask oxygen  Post-op Assessment: Report given to RN and Post -op Vital signs reviewed and stable  Post vital signs: Reviewed and stable  Last Vitals:  Vitals Value Taken Time  BP 134/79 03/06/2018  4:00 PM  Temp    Pulse 72 03/06/2018  4:02 PM  Resp 16 03/06/2018  4:02 PM  SpO2 100 % 03/06/2018  4:02 PM  Vitals shown include unvalidated device data.  Last Pain:  Vitals:   03/06/18 1412  TempSrc: Oral  PainSc:       Patients Stated Pain Goal: 2 (76/54/65 0354)  Complications: No apparent anesthesia complications

## 2018-03-06 NOTE — Progress Notes (Signed)
Day of Surgery   Subjective/Chief Complaint: States that he had bleeding from his incision on Friday.  Reports some subjective fever and chills over the weekend.  States he did not have power at his house so he could not really determine whether or not he had a temperature not.  No nausea, no vomiting, diarrhea or constipation no trouble urinating.   Objective: Vital signs in last 24 hours: Temp:  [98.4 F (36.9 C)-100.6 F (38.1 C)] 98.6 F (37 C) (11/18 1412) Pulse Rate:  [63-92] 68 (11/18 1412) Resp:  [12-22] 15 (11/18 1412) BP: (103-150)/(59-107) 122/78 (11/18 1412) SpO2:  [93 %-100 %] 97 % (11/18 1412) Weight:  [88 kg] 88 kg (11/17 2320) Last BM Date: 03/04/18  Intake/Output from previous day: 11/17 0701 - 11/18 0700 In: 1910.8 [P.O.:360; I.V.:500.8; IV Piggyback:1050] Out: 1000 [Urine:1000] Intake/Output this shift: Total I/O In: -  Out: 900 [Urine:900]  Alert, nontoxic appearing, not ill-appearing Clear to auscultation Regular Soft, nontender, nondistended Large amount of blanching erythema around the inguinal extension extending to the left hip area and below the inguinal crease on the left.  Less intense cellulitis extending to the contralateral side and right hip area as well.  When I press on the pubic area I can get seropurulent fluid out of the wound.  A fair amount of induration on left side as well  Lab Results:  Recent Labs    03/05/18 1258 03/06/18 0404  WBC 15.7* 13.3*  HGB 11.2* 10.1*  HCT 33.3* 30.5*  PLT 221 228   BMET Recent Labs    03/05/18 1258 03/06/18 0404  NA 129* 132*  K 3.9 4.0  CL 96* 99  CO2 21* 25  GLUCOSE 170* 228*  BUN 41* 23  CREATININE 1.42* 1.17  CALCIUM 8.7* 8.4*   PT/INR No results for input(s): LABPROT, INR in the last 72 hours. ABG No results for input(s): PHART, HCO3 in the last 72 hours.  Invalid input(s): PCO2, PO2  Studies/Results: Ct Abdomen Pelvis W Contrast  Result Date: 03/05/2018 CLINICAL DATA:   Recent left inguinal hernia repair with swelling and erythema around the surgical site, extending into the penis. EXAM: CT ABDOMEN AND PELVIS WITH CONTRAST TECHNIQUE: Multidetector CT imaging of the abdomen and pelvis was performed using the standard protocol following bolus administration of intravenous contrast. CONTRAST:  166mL ISOVUE-300 IOPAMIDOL (ISOVUE-300) INJECTION 61% COMPARISON:  CT abdomen pelvis dated December 25, 2014. FINDINGS: Lower chest: No acute abnormality. New 10 mm pulmonary nodule in the left lower lobe (series 4, image 16). Hepatobiliary: No focal liver abnormality is seen. No gallstones, gallbladder wall thickening, or biliary dilatation. Pancreas: Unremarkable. No pancreatic ductal dilatation or surrounding inflammatory changes. Spleen: Normal in size without focal abnormality. Small splenule near the left adrenal gland, unchanged Adrenals/Urinary Tract: The adrenal glands are unremarkable. Slight interval increase in size of a 2.7 cm simple cyst in the right kidney. No renal or ureteral calculi. No hydronephrosis. Bladder is unremarkable. Stomach/Bowel: Stomach is within normal limits. Appendix appears normal. No evidence of bowel wall thickening, distention, or inflammatory changes. Vascular/Lymphatic: Aortic atherosclerosis. Small reactive left inguinal lymph nodes. No enlarged abdominal or pelvic lymph nodes. Reproductive: Normal prostate.  Small bilateral hydroceles. Other: Prominent fluid and inflammatory changes of the superficial left inguinal fat extending inferiorly into the pubic region and superiorly along the left abdominal wall oblique muscles. No discrete fluid collection. No active contrast extravasation. No subcutaneous emphysema. Unchanged small to moderate fat containing right inguinal hernia. No pneumoperitoneum or intra-abdominal  free fluid. Musculoskeletal: No acute or significant osseous findings. Old left inferior pubic ramus fracture. IMPRESSION: 1. Prominent  superficial soft tissue inflammatory changes in the left groin extending inferiorly into the pubic region and superiorly along the left abdominal wall oblique muscles, most consistent with cellulitis. No discrete abscess. No subcutaneous emphysema to suggest necrotizing infection. No active bleeding. 2. New 10 mm pulmonary nodule in the left lower lobe. Consider one of the following in 3 months for both low-risk and high-risk individuals: (a) repeat chest CT, (b) follow-up PET-CT, or (c) tissue sampling. This recommendation follows the consensus statement: Guidelines for Management of Incidental Pulmonary Nodules Detected on CT Images: From the Fleischner Society 2017; Radiology 2017; 284:228-243. 3.  Aortic atherosclerosis (ICD10-I70.0). Electronically Signed   By: Titus Dubin M.D.   On: 03/05/2018 17:27    Anti-infectives: Anti-infectives (From admission, onward)   Start     Dose/Rate Route Frequency Ordered Stop   03/06/18 0800  [MAR Hold]  ceFAZolin (ANCEF) IVPB 1 g/50 mL premix     (MAR Hold since Mon 03/06/2018 at 1445. Reason: Transfer to a Procedural area.)   1 g 100 mL/hr over 30 Minutes Intravenous Every 8 hours 03/06/18 0723     03/05/18 1645  vancomycin (VANCOCIN) IVPB 1000 mg/200 mL premix     1,000 mg 200 mL/hr over 60 Minutes Intravenous  Once 03/05/18 1632 03/05/18 1916   03/05/18 1600  ceFAZolin (ANCEF) IVPB 1 g/50 mL premix     1 g 100 mL/hr over 30 Minutes Intravenous  Once 03/05/18 1548 03/05/18 1820      Assessment/Plan: s/p open repair of left recurrent inguinal hernia with mesh on October 31  Now with wound infection Unclear etiology how wound got infected. Nonetheless he has extensive cellulitis as well as purulent drainage.  Given the fact that he is purulent drainage I do not believe IV antibiotics will be sufficient.  I recommended return to the operating room for reopening of the incision, washout, debridement, possible mesh explantation.  We discussed that the  wound would have to heal by secondary intention.  We discussed that he would be at high risk for recurrent inguinal hernia.  We discussed risk and benefits including but not limited to bleeding, infection, injury to surrounding structures, need for additional procedures, perioperative cardiac and pulmonary events, blood clot formation   All his questions were asked and answered   I think operative washout is the best option at this point given the seropurulent fluid that I can easily express  [name]. Redmond Pulling, MD, FACS General, Bariatric, & Minimally Invasive Surgery [facility], [place]    LOS: 1 day    Greer Pickerel 03/06/2018

## 2018-03-06 NOTE — Anesthesia Preprocedure Evaluation (Addendum)
Anesthesia Evaluation  Patient identified by MRN, date of birth, ID band Patient awake    Reviewed: Allergy & Precautions, NPO status , Patient's Chart, lab work & pertinent test results, reviewed documented beta blocker date and time   History of Anesthesia Complications Negative for: history of anesthetic complications  Airway Mallampati: II  TM Distance: >3 FB Neck ROM: Full    Dental no notable dental hx. (+) Dental Advisory Given   Pulmonary neg pulmonary ROS, former smoker,    Pulmonary exam normal breath sounds clear to auscultation       Cardiovascular hypertension, Pt. on medications and Pt. on home beta blockers Normal cardiovascular exam     Neuro/Psych negative neurological ROS     GI/Hepatic Neg liver ROS, GERD  ,  Endo/Other  negative endocrine ROS  Renal/GU negative Renal ROS     Musculoskeletal negative musculoskeletal ROS (+)   Abdominal   Peds  Hematology negative hematology ROS (+)   Anesthesia Other Findings Day of surgery medications reviewed with the patient.  Reproductive/Obstetrics                            Anesthesia Physical  Anesthesia Plan  ASA: II  Anesthesia Plan: General   Post-op Pain Management:    Induction: Intravenous  PONV Risk Score and Plan: 2 and Ondansetron, Dexamethasone and Treatment may vary due to age or medical condition  Airway Management Planned: LMA  Additional Equipment:   Intra-op Plan:   Post-operative Plan: Extubation in OR  Informed Consent: I have reviewed the patients History and Physical, chart, labs and discussed the procedure including the risks, benefits and alternatives for the proposed anesthesia with the patient or authorized representative who has indicated his/her understanding and acceptance.   Dental advisory given  Plan Discussed with: CRNA and Anesthesiologist  Anesthesia Plan Comments:         Anesthesia Quick Evaluation                                  Anesthesia Evaluation    Airway Mallampati: II  TM Distance: >3 FB Neck ROM: Full    Dental no notable dental hx.    Pulmonary former smoker,    Pulmonary exam normal breath sounds clear to auscultation       Cardiovascular hypertension, Normal cardiovascular exam Rhythm:Regular Rate:Normal     Neuro/Psych    GI/Hepatic   Endo/Other    Renal/GU      Musculoskeletal   Abdominal   Peds  Hematology   Anesthesia Other Findings   Reproductive/Obstetrics                             Anesthesia Physical Anesthesia Plan  ASA: II  Anesthesia Plan: General   Post-op Pain Management:    Induction: Intravenous  PONV Risk Score and Plan:   Airway Management Planned:   Additional Equipment:   Intra-op Plan:   Post-operative Plan: Extubation in OR  Informed Consent: I have reviewed the patients History and Physical, chart, labs and discussed the procedure including the risks, benefits and alternatives for the proposed anesthesia with the patient or authorized representative who has indicated his/her understanding and acceptance.   Dental advisory given  Plan Discussed with: CRNA  Anesthesia Plan Comments:         Anesthesia Quick Evaluation

## 2018-03-07 ENCOUNTER — Encounter (HOSPITAL_COMMUNITY): Payer: Self-pay | Admitting: General Surgery

## 2018-03-07 LAB — CBC
HEMATOCRIT: 32.3 % — AB (ref 39.0–52.0)
HEMOGLOBIN: 10.6 g/dL — AB (ref 13.0–17.0)
MCH: 31.6 pg (ref 26.0–34.0)
MCHC: 32.8 g/dL (ref 30.0–36.0)
MCV: 96.4 fL (ref 80.0–100.0)
Platelets: 268 10*3/uL (ref 150–400)
RBC: 3.35 MIL/uL — ABNORMAL LOW (ref 4.22–5.81)
RDW: 13.9 % (ref 11.5–15.5)
WBC: 14.9 10*3/uL — AB (ref 4.0–10.5)
nRBC: 0 % (ref 0.0–0.2)

## 2018-03-07 LAB — BASIC METABOLIC PANEL
Anion gap: 9 (ref 5–15)
BUN: 14 mg/dL (ref 8–23)
CO2: 23 mmol/L (ref 22–32)
Calcium: 8.6 mg/dL — ABNORMAL LOW (ref 8.9–10.3)
Chloride: 105 mmol/L (ref 98–111)
Creatinine, Ser: 0.85 mg/dL (ref 0.61–1.24)
GFR calc Af Amer: >60 mL/min (ref >60)
GFR calc non Af Amer: >60 mL/min (ref >60)
Glucose, Bld: 170 mg/dL — ABNORMAL HIGH (ref 70–99)
POTASSIUM: 4.7 mmol/L (ref 3.5–5.1)
SODIUM: 137 mmol/L (ref 135–145)

## 2018-03-07 MED ORDER — VANCOMYCIN HCL 10 G IV SOLR
1750.0000 mg | INTRAVENOUS | Status: DC
  Administered 2018-03-07 – 2018-03-11 (×5): 1750 mg via INTRAVENOUS
  Filled 2018-03-07 (×10): qty 1750

## 2018-03-07 MED ORDER — MORPHINE SULFATE (PF) 2 MG/ML IV SOLN
2.0000 mg | Freq: Once | INTRAVENOUS | Status: AC
  Administered 2018-03-07: 2 mg via INTRAVENOUS
  Filled 2018-03-07: qty 1

## 2018-03-07 NOTE — Progress Notes (Signed)
Pharmacy Antibiotic Note  Johnny Peck is a 72 y.o. male with PMH of left inguinal hernia repair with mesh on 02/16/2018 admitted on 03/05/2018 with cellulitis. Patient underwent I&D of infected left inguinal incision on 03/06/2018. Pharmacy has been consulted for Vancomycin dosing post-operatively.  Plan: Adjust Vancomycin maintenance dose to 1750mg  IV q24h.  Plan for Vancomycin peak/trough levels at steady state, as indicated. Monitor renal function, cultures, clinical course.   Height: 5\' 10"  (177.8 cm) Weight: 194 lb 0.1 oz (88 kg) IBW/kg (Calculated) : 73  Temp (24hrs), Avg:98.3 F (36.8 C), Min:97.7 F (36.5 C), Max:99.5 F (37.5 C)  Recent Labs  Lab 03/05/18 1258 03/05/18 1306 03/05/18 1559 03/06/18 0404 03/07/18 0403  WBC 15.7*  --   --  13.3* 14.9*  CREATININE 1.42*  --   --  1.17 0.85  LATICACIDVEN  --  0.97 1.36  --   --     Estimated Creatinine Clearance: 89.1 mL/min (by C-G formula based on SCr of 0.85 mg/dL).    No Known Allergies  Antimicrobials this admission: 11/17 Cefazolin, Vancomycin x 1 11/18 Cefazolin >> 11/18 11/18 Vancomycin >>   Microbiology results: 11/17 BCx: NGTD 11/17 UCx: 40K Staph aureus 11/18 surgical/deep wound cx: moderate Staph aureus, cx reincubated for better growth   Thank you for allowing pharmacy to be a part of this patient's care.   Lindell Spar, PharmD, BCPS Pager: [phone] 03/07/2018 1:28 PM

## 2018-03-07 NOTE — Progress Notes (Signed)
1 Day Post-Op   Subjective/Chief Complaint: Feels better. Feels less painful   Objective: Vital signs in last 24 hours: Temp:  [97.7 F (36.5 C)-98.5 F (36.9 C)] 97.8 F (36.6 C) (11/19 1355) Pulse Rate:  [58-77] 65 (11/19 1355) Resp:  [14-19] 19 (11/19 1355) BP: (111-128)/(65-81) 128/80 (11/19 1355) SpO2:  [93 %-100 %] 96 % (11/19 1355) Last BM Date: 03/05/18  Intake/Output from previous day: 11/18 0701 - 11/19 0700 In: 2473.8 [P.O.:300; I.V.:1573.8; IV Piggyback:600] Out: 0263 [Urine:3750] Intake/Output this shift: Total I/O In: -  Out: 500 [Urine:500]  Alert, resting comfortably cta Reg L inguinal incision - still with fair amount of cellulitis and induration ext to L inguinal region/hip; less cellulitis on Rt groin/hip area.  Wound changed - pt tolerated ok. No more purulent drainage.   Lab Results:  Recent Labs    03/06/18 0404 03/07/18 0403  WBC 13.3* 14.9*  HGB 10.1* 10.6*  HCT 30.5* 32.3*  PLT 228 268   BMET Recent Labs    03/06/18 0404 03/07/18 0403  NA 132* 137  K 4.0 4.7  CL 99 105  CO2 25 23  GLUCOSE 228* 170*  BUN 23 14  CREATININE 1.17 0.85  CALCIUM 8.4* 8.6*   PT/INR No results for input(s): LABPROT, INR in the last 72 hours. ABG No results for input(s): PHART, HCO3 in the last 72 hours.  Invalid input(s): PCO2, PO2  Studies/Results: No results found.  Anti-infectives: Anti-infectives (From admission, onward)   Start     Dose/Rate Route Frequency Ordered Stop   03/07/18 1800  vancomycin (VANCOCIN) 1,500 mg in sodium chloride 0.9 % 500 mL IVPB  Status:  Discontinued     1,500 mg 250 mL/hr over 120 Minutes Intravenous Every 24 hours 03/06/18 1731 03/07/18 1324   03/07/18 1600  vancomycin (VANCOCIN) 1,750 mg in sodium chloride 0.9 % 500 mL IVPB     1,750 mg 250 mL/hr over 120 Minutes Intravenous Every 24 hours 03/07/18 1324     03/06/18 1800  vancomycin (VANCOCIN) 1,750 mg in sodium chloride 0.9 % 500 mL IVPB     1,750 mg 250  mL/hr over 120 Minutes Intravenous  Once 03/06/18 1721 03/06/18 2053   03/06/18 0800  ceFAZolin (ANCEF) IVPB 1 g/50 mL premix  Status:  Discontinued     1 g 100 mL/hr over 30 Minutes Intravenous Every 8 hours 03/06/18 0723 03/06/18 1721   03/05/18 1645  vancomycin (VANCOCIN) IVPB 1000 mg/200 mL premix     1,000 mg 200 mL/hr over 60 Minutes Intravenous  Once 03/05/18 1632 03/05/18 1916   03/05/18 1600  ceFAZolin (ANCEF) IVPB 1 g/50 mL premix     1 g 100 mL/hr over 30 Minutes Intravenous  Once 03/05/18 1548 03/05/18 1820      Assessment/Plan: Infected purulent L inguinal wound s/p open repair of recurrent L inguinal hernia 10/31 s/p Procedure(s): IRRIGATION AND DEBRIDEMENT LEFT INGUINAL INCISION (Left)  No fever.  cx growing GPC so far Cont IV abx F/u cx Ambulate Dressing changes Not ready for dc until cellulitis/induration improving Discussed intraop findings  LOS: 2 days    Greer Pickerel 03/07/2018

## 2018-03-08 LAB — CBC
HCT: 32 % — ABNORMAL LOW (ref 39.0–52.0)
Hemoglobin: 10.3 g/dL — ABNORMAL LOW (ref 13.0–17.0)
MCH: 31.9 pg (ref 26.0–34.0)
MCHC: 32.2 g/dL (ref 30.0–36.0)
MCV: 99.1 fL (ref 80.0–100.0)
PLATELETS: 304 10*3/uL (ref 150–400)
RBC: 3.23 MIL/uL — AB (ref 4.22–5.81)
RDW: 14.1 % (ref 11.5–15.5)
WBC: 14.4 10*3/uL — ABNORMAL HIGH (ref 4.0–10.5)
nRBC: 0 % (ref 0.0–0.2)

## 2018-03-08 LAB — URINE CULTURE

## 2018-03-08 LAB — BASIC METABOLIC PANEL
Anion gap: 7 (ref 5–15)
BUN: 21 mg/dL (ref 8–23)
CO2: 28 mmol/L (ref 22–32)
Calcium: 8.9 mg/dL (ref 8.9–10.3)
Chloride: 105 mmol/L (ref 98–111)
Creatinine, Ser: 1.01 mg/dL (ref 0.61–1.24)
GFR calc Af Amer: >60 mL/min (ref >60)
GLUCOSE: 126 mg/dL — AB (ref 70–99)
POTASSIUM: 4.8 mmol/L (ref 3.5–5.1)
Sodium: 140 mmol/L (ref 135–145)

## 2018-03-08 LAB — MRSA PCR SCREENING: MRSA by PCR: POSITIVE — AB

## 2018-03-08 MED ORDER — CHLORHEXIDINE GLUCONATE CLOTH 2 % EX PADS
6.0000 | MEDICATED_PAD | Freq: Every day | CUTANEOUS | Status: AC
  Administered 2018-03-08 – 2018-03-12 (×4): 6 via TOPICAL

## 2018-03-08 MED ORDER — MUPIROCIN 2 % EX OINT
1.0000 "application " | TOPICAL_OINTMENT | Freq: Two times a day (BID) | CUTANEOUS | Status: AC
  Administered 2018-03-08 – 2018-03-12 (×10): 1 via NASAL
  Filled 2018-03-08 (×4): qty 22

## 2018-03-08 MED ORDER — MORPHINE SULFATE (PF) 2 MG/ML IV SOLN
1.0000 mg | INTRAVENOUS | Status: DC | PRN
  Administered 2018-03-08 – 2018-03-13 (×15): 2 mg via INTRAVENOUS
  Filled 2018-03-08 (×16): qty 1

## 2018-03-08 NOTE — Care Management Important Message (Signed)
Important Message  Patient Details  Name: Johnny Peck MRN: [id number] Date of Birth: [birth year]   Medicare Important Message Given:  Yes    Adi Doro 03/08/2018, 12:02 PM

## 2018-03-08 NOTE — Progress Notes (Signed)
2 Days Post-Op   Subjective/Chief Complaint: Doing well. Less pain.  No c/o.   Objective: Vital signs in last 24 hours: Temp:  [97.8 F (36.6 C)-98.5 F (36.9 C)] 97.9 F (36.6 C) (11/20 0554) Pulse Rate:  [60-73] 60 (11/20 0554) Resp:  [16-19] 16 (11/20 0554) BP: (121-145)/(80-83) 145/83 (11/20 0554) SpO2:  [96 %-100 %] 100 % (11/20 0554) Last BM Date: 03/05/18  Intake/Output from previous day: 11/19 0701 - 11/20 0700 In: 1286.5 [P.O.:720; I.V.:566.5] Out: 2650 [Urine:2650] Intake/Output this shift: No intake/output data recorded.  Alert,  cta Reg Soft, nt, nd Essentially resolved cellulitis on Rt side No cellulitis on L hip anymore Still with induration and cellulitis on/around/inf to L inguinal incision   Lab Results:  Recent Labs    03/07/18 0403 03/08/18 0519  WBC 14.9* 14.4*  HGB 10.6* 10.3*  HCT 32.3* 32.0*  PLT 268 304   BMET Recent Labs    03/07/18 0403 03/08/18 0519  NA 137 140  K 4.7 4.8  CL 105 105  CO2 23 28  GLUCOSE 170* 126*  BUN 14 21  CREATININE 0.85 1.01  CALCIUM 8.6* 8.9   PT/INR No results for input(s): LABPROT, INR in the last 72 hours. ABG No results for input(s): PHART, HCO3 in the last 72 hours.  Invalid input(s): PCO2, PO2  Studies/Results: No results found.  Anti-infectives: Anti-infectives (From admission, onward)   Start     Dose/Rate Route Frequency Ordered Stop   03/07/18 1800  vancomycin (VANCOCIN) 1,500 mg in sodium chloride 0.9 % 500 mL IVPB  Status:  Discontinued     1,500 mg 250 mL/hr over 120 Minutes Intravenous Every 24 hours 03/06/18 1731 03/07/18 1324   03/07/18 1600  vancomycin (VANCOCIN) 1,750 mg in sodium chloride 0.9 % 500 mL IVPB     1,750 mg 250 mL/hr over 120 Minutes Intravenous Every 24 hours 03/07/18 1324     03/06/18 1800  vancomycin (VANCOCIN) 1,750 mg in sodium chloride 0.9 % 500 mL IVPB     1,750 mg 250 mL/hr over 120 Minutes Intravenous  Once 03/06/18 1721 03/06/18 2053   03/06/18  0800  ceFAZolin (ANCEF) IVPB 1 g/50 mL premix  Status:  Discontinued     1 g 100 mL/hr over 30 Minutes Intravenous Every 8 hours 03/06/18 0723 03/06/18 1721   03/05/18 1645  vancomycin (VANCOCIN) IVPB 1000 mg/200 mL premix     1,000 mg 200 mL/hr over 60 Minutes Intravenous  Once 03/05/18 1632 03/05/18 1916   03/05/18 1600  ceFAZolin (ANCEF) IVPB 1 g/50 mL premix     1 g 100 mL/hr over 30 Minutes Intravenous  Once 03/05/18 1548 03/05/18 1820      Assessment/Plan: Infected purulent L inguinal wound s/p open repair of recurrent L inguinal hernia 10/31 s/p Procedure(s): IRRIGATION AND DEBRIDEMENT LEFT INGUINAL INCISION (Left)  No fever. Wbc stable  cx growing staph so far Cont IV abx F/u cx Ambulate Dressing changes Cellulitis improving but still needs IV therapy Not ready for dc until cellulitis/induration improving   LOS: 3 days    Greer Pickerel 03/08/2018

## 2018-03-09 NOTE — Progress Notes (Signed)
3 Days Post-Op   Subjective/Chief Complaint: Didn't walk much yesterday.  No n/v.  Pain ok Eating drinking well   Objective: Vital signs in last 24 hours: Temp:  [98.5 F (36.9 C)-99.1 F (37.3 C)] 99 F (37.2 C) (11/21 0533) Pulse Rate:  [66-78] 72 (11/21 0533) Resp:  [18-20] 20 (11/21 0533) BP: (124-144)/(77-90) 144/88 (11/21 0533) SpO2:  [95 %-98 %] 98 % (11/21 0533) Last BM Date: 03/05/18  Intake/Output from previous day: 11/20 0701 - 11/21 0700 In: 1779.9 [P.O.:930; I.V.:349.9; IV Piggyback:500] Out: 5425 [Urine:5425] Intake/Output this shift: Total I/O In: -  Out: 575 [Urine:575]  Asleep, easily arouses cta Reg Soft, nt, nd L groin - still with a fair amount of induration/cellulitis inferior and lateral to wound  Lab Results:  Recent Labs    03/07/18 0403 03/08/18 0519  WBC 14.9* 14.4*  HGB 10.6* 10.3*  HCT 32.3* 32.0*  PLT 268 304   BMET Recent Labs    03/07/18 0403 03/08/18 0519  NA 137 140  K 4.7 4.8  CL 105 105  CO2 23 28  GLUCOSE 170* 126*  BUN 14 21  CREATININE 0.85 1.01  CALCIUM 8.6* 8.9   PT/INR No results for input(s): LABPROT, INR in the last 72 hours. ABG No results for input(s): PHART, HCO3 in the last 72 hours.  Invalid input(s): PCO2, PO2  Studies/Results: No results found.  Anti-infectives: Anti-infectives (From admission, onward)   Start     Dose/Rate Route Frequency Ordered Stop   03/07/18 1800  vancomycin (VANCOCIN) 1,500 mg in sodium chloride 0.9 % 500 mL IVPB  Status:  Discontinued     1,500 mg 250 mL/hr over 120 Minutes Intravenous Every 24 hours 03/06/18 1731 03/07/18 1324   03/07/18 1600  vancomycin (VANCOCIN) 1,750 mg in sodium chloride 0.9 % 500 mL IVPB     1,750 mg 250 mL/hr over 120 Minutes Intravenous Every 24 hours 03/07/18 1324     03/06/18 1800  vancomycin (VANCOCIN) 1,750 mg in sodium chloride 0.9 % 500 mL IVPB     1,750 mg 250 mL/hr over 120 Minutes Intravenous  Once 03/06/18 1721 03/06/18 2053   03/06/18 0800  ceFAZolin (ANCEF) IVPB 1 g/50 mL premix  Status:  Discontinued     1 g 100 mL/hr over 30 Minutes Intravenous Every 8 hours 03/06/18 0723 03/06/18 1721   03/05/18 1645  vancomycin (VANCOCIN) IVPB 1000 mg/200 mL premix     1,000 mg 200 mL/hr over 60 Minutes Intravenous  Once 03/05/18 1632 03/05/18 1916   03/05/18 1600  ceFAZolin (ANCEF) IVPB 1 g/50 mL premix     1 g 100 mL/hr over 30 Minutes Intravenous  Once 03/05/18 1548 03/05/18 1820      Assessment/Plan: Infected purulent L inguinal wound s/p open repair of recurrent L inguinal hernia 10/31 s/pProcedure(s): IRRIGATION AND DEBRIDEMENT LEFT INGUINAL INCISION (Left)  No fever. Wbc stable  cx growing staph so far Cont IV abx F/u cx Ambulate Dressing changes Cellulitis improving but still needs IV therapy Not ready for dc until cellulitis/induration improving Increase ambulation Decrease IVF  LOS: 4 days    Johnny Peck 03/09/2018

## 2018-03-10 LAB — CBC
HEMATOCRIT: 35.5 % — AB (ref 39.0–52.0)
Hemoglobin: 11.3 g/dL — ABNORMAL LOW (ref 13.0–17.0)
MCH: 30.3 pg (ref 26.0–34.0)
MCHC: 31.8 g/dL (ref 30.0–36.0)
MCV: 95.2 fL (ref 80.0–100.0)
NRBC: 0 % (ref 0.0–0.2)
Platelets: 419 10*3/uL — ABNORMAL HIGH (ref 150–400)
RBC: 3.73 MIL/uL — AB (ref 4.22–5.81)
RDW: 13.9 % (ref 11.5–15.5)
WBC: 14.5 10*3/uL — ABNORMAL HIGH (ref 4.0–10.5)

## 2018-03-10 LAB — CREATININE, SERUM
Creatinine, Ser: 0.86 mg/dL (ref 0.61–1.24)
GFR calc non Af Amer: >60 mL/min (ref >60)

## 2018-03-10 LAB — CULTURE, BLOOD (ROUTINE X 2)
Culture: NO GROWTH
Special Requests: ADEQUATE

## 2018-03-10 LAB — VANCOMYCIN, PEAK: VANCOMYCIN PK: 27 ug/mL — AB (ref 30–40)

## 2018-03-10 MED ORDER — JUVEN PO PACK
1.0000 | PACK | Freq: Two times a day (BID) | ORAL | Status: DC
  Administered 2018-03-11 – 2018-03-13 (×5): 1 via ORAL
  Filled 2018-03-10 (×8): qty 1

## 2018-03-10 NOTE — Consult Note (Signed)
[place] Nurse wound consult note Reason for Consult: surgical wound with complications, cellulitis Wound type:surgical Pressure Injury POA: N/A Measurement: 0.4cm x 7cm x 4cm with 6cm undermining at 3 o'clock and 4 cm undermining at 9 o'clock. Wound bed: beefy red, 100% Drainage (amount, consistency, odor) moderate amount serosanguinous  Periwound:intact with periwound erythema that according to patient is improving L>R. Scrotal edema, erythema Dressing procedure/placement/frequency: Dr. Redmond Pulling has indicated that NPWT is preferred over antimicrobial dressings.  I have provided orders and supplies for the bedside RN to apply today.  [place] nursing team will not follow, but will remain available to this patient, the nursing, surgical and medical teams.  Please re-consult if needed. Thanks, Maudie Flakes, MSN, RN, [place], Arther Abbott  Pager# [phone]

## 2018-03-10 NOTE — Progress Notes (Addendum)
Pharmacy Antibiotic Note  Johnny Peck is a 72 y.o. male with PMH of left inguinal hernia repair with mesh on 02/16/2018 admitted on 03/05/2018 with cellulitis. Patient underwent I&D of infected left inguinal incision on 03/06/2018. Pharmacy has been consulted for Vancomycin dosing post-operatively.  Today, 03/10/2018:  WBC still elevated but stable  Remains afebrile  Per Surgery, looking better, but wants to continue IV abx for now  Plan:  Continue Vancomycin 1750mg  IV q24h (AUC 494 with SCr 1.01)  Checking AUC tomorrow with peak/trough levels  Monitor renal function, cultures, clinical course.   Height: 5\' 10"  (177.8 cm) Weight: 194 lb 0.1 oz (88 kg) IBW/kg (Calculated) : 73  Temp (24hrs), Avg:98.6 F (37 C), Min:98.4 F (36.9 C), Max:99.1 F (37.3 C)  Recent Labs  Lab 03/05/18 1258 03/05/18 1306 03/05/18 1559 03/06/18 0404 03/07/18 0403 03/08/18 0519 03/10/18 0414  WBC 15.7*  --   --  13.3* 14.9* 14.4* 14.5*  CREATININE 1.42*  --   --  1.17 0.85 1.01 0.86  LATICACIDVEN  --  0.97 1.36  --   --   --   --     Estimated Creatinine Clearance: 88 mL/min (by C-G formula based on SCr of 0.86 mg/dL).    No Known Allergies  Antimicrobials this admission: 11/17 Cefazolin, Vancomycin x 1 11/18 Cefazolin >> 11/18 11/18 Vancomycin >>   Dose adjustments this admission: 11/19: adjust Vanc from 1500mg  to 1750mg  q24h for improvement in renal fxn    Microbiology results: 11/17 BCx: NGTD 11/17 UCx: 40K MRSA  11/18 surgical/deep wound cx: MRSA 11/20 MRSA PCR: positive    Thank you for allowing pharmacy to be a part of this patient's care.  Reuel Boom, PharmD, BCPS [phone] 03/10/2018, 2:31 PM

## 2018-03-10 NOTE — Progress Notes (Signed)
4 Days Post-Op   Subjective/Chief Complaint: Doing well  no c/o Walked several times No n/v. Eating well Had a BM he reports   Objective: Vital signs in last 24 hours: Temp:  [98.4 F (36.9 C)-99.1 F (37.3 C)] 99.1 F (37.3 C) (11/22 0645) Pulse Rate:  [67-71] 71 (11/22 0645) Resp:  [18] 18 (11/22 0645) BP: (132-143)/(82-90) 132/82 (11/22 0645) SpO2:  [93 %-97 %] 93 % (11/22 0645) Last BM Date: 03/05/18  Intake/Output from previous day: 11/21 0701 - 11/22 0700 In: 1409.6 [P.O.:200; I.V.:209.6; IV Piggyback:1000] Out: 5825 [Urine:5825] Intake/Output this shift: Total I/O In: 240 [P.O.:240] Out: 1500 [Urine:1500]  Alert, resting comfortably cta Reg Rt hip cellulitis resolved Skin around L inguinal incision/wound is no longer erythematous; still with cellulitis and induration inferior to wound - mainly prox L thigh.  Wound bed - beefy red, good granulation tissue, still undermines laterally to L hip. No necrotic tissue. serosang drainage.   Lab Results:  Recent Labs    03/08/18 0519 03/10/18 0414  WBC 14.4* 14.5*  HGB 10.3* 11.3*  HCT 32.0* 35.5*  PLT 304 419*   BMET Recent Labs    03/08/18 0519 03/10/18 0414  NA 140  --   K 4.8  --   CL 105  --   CO2 28  --   GLUCOSE 126*  --   BUN 21  --   CREATININE 1.01 0.86  CALCIUM 8.9  --    PT/INR No results for input(s): LABPROT, INR in the last 72 hours. ABG No results for input(s): PHART, HCO3 in the last 72 hours.  Invalid input(s): PCO2, PO2  Studies/Results: No results found.  Anti-infectives: Anti-infectives (From admission, onward)   Start     Dose/Rate Route Frequency Ordered Stop   03/07/18 1800  vancomycin (VANCOCIN) 1,500 mg in sodium chloride 0.9 % 500 mL IVPB  Status:  Discontinued     1,500 mg 250 mL/hr over 120 Minutes Intravenous Every 24 hours 03/06/18 1731 03/07/18 1324   03/07/18 1600  vancomycin (VANCOCIN) 1,750 mg in sodium chloride 0.9 % 500 mL IVPB     1,750 mg 250 mL/hr  over 120 Minutes Intravenous Every 24 hours 03/07/18 1324     03/06/18 1800  vancomycin (VANCOCIN) 1,750 mg in sodium chloride 0.9 % 500 mL IVPB     1,750 mg 250 mL/hr over 120 Minutes Intravenous  Once 03/06/18 1721 03/06/18 2053   03/06/18 0800  ceFAZolin (ANCEF) IVPB 1 g/50 mL premix  Status:  Discontinued     1 g 100 mL/hr over 30 Minutes Intravenous Every 8 hours 03/06/18 0723 03/06/18 1721   03/05/18 1645  vancomycin (VANCOCIN) IVPB 1000 mg/200 mL premix     1,000 mg 200 mL/hr over 60 Minutes Intravenous  Once 03/05/18 1632 03/05/18 1916   03/05/18 1600  ceFAZolin (ANCEF) IVPB 1 g/50 mL premix     1 g 100 mL/hr over 30 Minutes Intravenous  Once 03/05/18 1548 03/05/18 1820      Assessment/Plan: s/p Procedure(s): IRRIGATION AND DEBRIDEMENT LEFT INGUINAL INCISION (Left)  Cellulitis is slowly improving. No fever. Wbc still around 14.  Wound bed looks good I think it is ok for NWPT since cellulitis is remote from where plastic drape will be.  Still needs IV abx for persistent cellulitis/induration - however it is improving.   LOS: 5 days    Johnny Peck 03/10/2018

## 2018-03-10 NOTE — Progress Notes (Signed)
Initial Nutrition Assessment  DOCUMENTATION CODES:   Not applicable  INTERVENTION:   Juven Fruit Punch BID, each serving provides 95kcal and 2.5g of protein (amino acids glutamine and arginine)  NUTRITION DIAGNOSIS:   Increased nutrient needs related to post-op healing as evidenced by estimated needs.  GOAL:   Patient will meet greater than or equal to 90% of their needs  MONITOR:   PO intake, Supplement acceptance, Labs, I & O's, Weight trends  REASON FOR ASSESSMENT:   Consult Wound healing  ASSESSMENT:   Patient with PMH significant for left inguinal hernia s/p repair in 2016. Recently underwent management of recurrent left inguinal hernia 2.5 weeks ago. Presents this admission with discomfort as well as intermittent swelling in his left groin. Admitted for significant cellulitis associated with recent surgery.     11/22- Incision and drainage and  pulsatile lavage of infected left inguinal hernia 11/19- regular diet   Pt denies any loss in appetite PTA. States he typically eats 2-3 meals daily that consist of good protein sources like chicken and beef. Reports the groin pain did not affect his appetite.   RD consulted for wound healing. Pt has consumed 100% of each meal while admitted. He ate a [place] salad sandwich for lunch without complication. Discussed the importance of protein intake to help with wound healing. Will provide pt with Juven as he is meeting protein needs with intake alone.   Pt denies any recent wt loss and reports a UBW of 190 lb. Records indicate pt has maintained a stable wt over the year. Nutrition-Focused physical exam completed.   Medications reviewed and include: 1/2NS with 20 mEq KCL @ 10 ml/hr Labs reviewed.   NUTRITION - FOCUSED PHYSICAL EXAM:    Most Recent Value  Orbital Region  No depletion  Upper Arm Region  No depletion  Thoracic and Lumbar Region  Unable to assess  Buccal Region  No depletion  Temple Region  No depletion   Clavicle Bone Region  No depletion  Clavicle and Acromion Bone Region  No depletion  Scapular Bone Region  Unable to assess  Dorsal Hand  No depletion  Patellar Region  No depletion  Anterior Thigh Region  No depletion  Posterior Calf Region  No depletion  Edema (RD Assessment)  None     Diet Order:   Diet Order            Diet regular Room service appropriate? Yes; Fluid consistency: Thin  Diet effective now              EDUCATION NEEDS:   Education needs have been addressed  Skin:  Skin Assessment: Skin Integrity Issues: Skin Integrity Issues:: Incisions Incisions: left groin  Last BM:  03/10/18  Height:   Ht Readings from Last 1 Encounters:  03/05/18 5\' 10"  (1.778 m)    Weight:   Wt Readings from Last 1 Encounters:  03/05/18 88 kg    Ideal Body Weight:  75.5 kg  BMI:  Body mass index is 27.84 kg/m.  Estimated Nutritional Needs:   Kcal:  2000-2200 kcal  Protein:  100-115 grams  Fluid:  >/= 2 L/day   Mariana Single RD, LDN Clinical Nutrition Pager # - [phone]

## 2018-03-10 NOTE — Progress Notes (Signed)
Call to Pharmacy to let them know Vancomycin has not arrived despite notification made to them by writer. They "are making it now" and will send. Asked them to please adjust times for Vancomycin related labwork, ie, peak, trough.  Donne Hazel, RN

## 2018-03-10 NOTE — Progress Notes (Signed)
Discussed wound care with patient and attending. Patient does not have a teachable caregiver to assist with wound care. Plan to eval for wound vac. Contacted [facility] to assist with vac and home care. Awaiting final orders and assessment for vac.

## 2018-03-11 LAB — AEROBIC/ANAEROBIC CULTURE W GRAM STAIN (SURGICAL/DEEP WOUND)

## 2018-03-11 LAB — CULTURE, BLOOD (ROUTINE X 2): CULTURE: NO GROWTH

## 2018-03-11 LAB — VANCOMYCIN, TROUGH: Vancomycin Tr: 6 ug/mL — ABNORMAL LOW (ref 15–20)

## 2018-03-11 LAB — AEROBIC/ANAEROBIC CULTURE (SURGICAL/DEEP WOUND)

## 2018-03-11 MED ORDER — LIP MEDEX EX OINT
TOPICAL_OINTMENT | CUTANEOUS | Status: DC | PRN
  Filled 2018-03-11: qty 7

## 2018-03-11 MED ORDER — VANCOMYCIN HCL IN DEXTROSE 1-5 GM/200ML-% IV SOLN
1000.0000 mg | Freq: Two times a day (BID) | INTRAVENOUS | Status: DC
  Administered 2018-03-12 – 2018-03-13 (×2): 1000 mg via INTRAVENOUS
  Filled 2018-03-11 (×3): qty 200

## 2018-03-11 NOTE — Progress Notes (Signed)
5 Days Post-Op   Subjective/Chief Complaint: No c/o.    Objective: Vital signs in last 24 hours: Temp:  [98.4 F (36.9 C)-98.7 F (37.1 C)] 98.5 F (36.9 C) (11/23 0526) Pulse Rate:  [70-72] 72 (11/23 0526) Resp:  [14-18] 14 (11/23 0526) BP: (135-142)/(87-89) 135/87 (11/23 0526) SpO2:  [98 %] 98 % (11/23 0526) Last BM Date: 03/09/18  Intake/Output from previous day: 11/22 0701 - 11/23 0700 In: 2467.2 [P.O.:1740; I.V.:239.9; IV Piggyback:487.3] Out: 4075 [Urine:4075] Intake/Output this shift: Total I/O In: -  Out: 750 [Urine:750]  Asleep, easily awaken symm chest rise nml rate Wound vac intact; no erythema around wound; still with cellulitis and induration in prox L thigh/lower inguinal area however it does look less intense  Lab Results:  Recent Labs    03/10/18 0414  WBC 14.5*  HGB 11.3*  HCT 35.5*  PLT 419*   BMET Recent Labs    03/10/18 0414  CREATININE 0.86   PT/INR No results for input(s): LABPROT, INR in the last 72 hours. ABG No results for input(s): PHART, HCO3 in the last 72 hours.  Invalid input(s): PCO2, PO2  Studies/Results: No results found.  Anti-infectives: Anti-infectives (From admission, onward)   Start     Dose/Rate Route Frequency Ordered Stop   03/07/18 1800  vancomycin (VANCOCIN) 1,500 mg in sodium chloride 0.9 % 500 mL IVPB  Status:  Discontinued     1,500 mg 250 mL/hr over 120 Minutes Intravenous Every 24 hours 03/06/18 1731 03/07/18 1324   03/07/18 1600  vancomycin (VANCOCIN) 1,750 mg in sodium chloride 0.9 % 500 mL IVPB     1,750 mg 250 mL/hr over 120 Minutes Intravenous Every 24 hours 03/07/18 1324     03/06/18 1800  vancomycin (VANCOCIN) 1,750 mg in sodium chloride 0.9 % 500 mL IVPB     1,750 mg 250 mL/hr over 120 Minutes Intravenous  Once 03/06/18 1721 03/06/18 2053   03/06/18 0800  ceFAZolin (ANCEF) IVPB 1 g/50 mL premix  Status:  Discontinued     1 g 100 mL/hr over 30 Minutes Intravenous Every 8 hours 03/06/18 0723  03/06/18 1721   03/05/18 1645  vancomycin (VANCOCIN) IVPB 1000 mg/200 mL premix     1,000 mg 200 mL/hr over 60 Minutes Intravenous  Once 03/05/18 1632 03/05/18 1916   03/05/18 1600  ceFAZolin (ANCEF) IVPB 1 g/50 mL premix     1 g 100 mL/hr over 30 Minutes Intravenous  Once 03/05/18 1548 03/05/18 1820      Assessment/Plan: s/p Procedure(s): IRRIGATION AND DEBRIDEMENT LEFT INGUINAL INCISION (Left)  Cellulitis is slowly improving. No fever.   Wound bed looks good Cont NWPT Still needs IV abx for persistent cellulitis/induration - however it is improving. prob home Sunday   LOS: 6 days    Greer Pickerel 03/11/2018

## 2018-03-11 NOTE — Progress Notes (Signed)
Pharmacy Antibiotic Note  Johnny Peck is a 72 y.o. male with PMH of left inguinal hernia repair with mesh on 02/16/2018 admitted on 03/05/2018 with cellulitis. Patient underwent I&D of infected left inguinal incision on 03/06/2018. Pharmacy has been consulted for Vancomycin dosing post-operatively.  Today, 03/11/2018:  WBC still elevated but stable  Remains afebrile  SCr 0.9 - stable, WNL. CrCl ~ 88 mL/min  Day #6 IV vancomycin on vancomycin 1750 mg IV q24h  11/22 vancomycin peak @ 2216 = 27 mcg/mL  11/23 vancomycin trough @ 1715 = 6 mcg/mL  Calculated AUC = 388 is slightly below goal. Calculated half-life ~9 hours.  Plan:  Will adjust vancomycin dose to 1000 mg IV q12h for estimated AUC of 445  Monitor renal function, cultures, clinical course.   Height: 5\' 10"  (177.8 cm) Weight: 194 lb 0.1 oz (88 kg) IBW/kg (Calculated) : 73  Temp (24hrs), Avg:98.8 F (37.1 C), Min:98.5 F (36.9 C), Max:99.1 F (37.3 C)  Recent Labs  Lab 03/05/18 1258 03/05/18 1306 03/05/18 1559 03/06/18 0404 03/07/18 0403 03/08/18 0519 03/10/18 0414 03/10/18 2216 03/11/18 1715  WBC 15.7*  --   --  13.3* 14.9* 14.4* 14.5*  --   --   CREATININE 1.42*  --   --  1.17 0.85 1.01 0.86  --   --   LATICACIDVEN  --  0.97 1.36  --   --   --   --   --   --   VANCOTROUGH  --   --   --   --   --   --   --   --  6*  VANCOPEAK  --   --   --   --   --   --   --  27*  --     Estimated Creatinine Clearance: 88 mL/min (by C-G formula based on SCr of 0.86 mg/dL).    No Known Allergies  Antimicrobials this admission: 11/17 Cefazolin, Vancomycin x 1 11/18 Cefazolin >> 11/18 11/18 Vancomycin >>   Dose adjustments this admission: 11/19: adjust Vanc from 1500mg  to 1750mg  q24h for improvement in renal fxn  11/23: Adjust vancomycin from 1750 mg IV q12h to 1000 mg IV q12h   Microbiology results: 11/17 BCx: NGTD 11/17 UCx: 40K MRSA  11/18 surgical/deep wound cx: MRSA 11/20 MRSA PCR: positive    Thank  you for allowing pharmacy to be a part of this patient's care.  Lenis Noon, PharmD Clinical Pharmacist 03/11/18 7:28 PM

## 2018-03-12 LAB — CREATININE, SERUM
Creatinine, Ser: 1.11 mg/dL (ref 0.61–1.24)
GFR calc Af Amer: >60 mL/min (ref >60)

## 2018-03-12 LAB — GLUCOSE, CAPILLARY: Glucose-Capillary: 209 mg/dL — ABNORMAL HIGH (ref 70–99)

## 2018-03-12 NOTE — Progress Notes (Signed)
6 Days Post-Op   Subjective/Chief Complaint: Pain is ok with oxy No n/v Having BMs   Objective: Vital signs in last 24 hours: Temp:  [98.2 F (36.8 C)-99.3 F (37.4 C)] 98.2 F (36.8 C) (11/24 0529) Pulse Rate:  [68-77] 68 (11/24 0529) Resp:  [14-20] 14 (11/24 0529) BP: (126-131)/(73-86) 126/86 (11/24 0529) SpO2:  [95 %-98 %] 98 % (11/24 0529) Last BM Date: 03/12/18  Intake/Output from previous day: 11/23 0701 - 11/24 0700 In: 621.2 [P.O.:420; I.V.:201.2] Out: 5300 [Urine:5300] Intake/Output this shift: Total I/O In: -  Out: 500 [Urine:500]  Alert, nontoxic cta Soft, ng L inguinal - wound vac intact; no cellulitis around open wound; cellulitis/induration prox L thigh slowly improving  Lab Results:  Recent Labs    03/10/18 0414  WBC 14.5*  HGB 11.3*  HCT 35.5*  PLT 419*   BMET Recent Labs    03/10/18 0414 03/12/18 0353  CREATININE 0.86 1.11   PT/INR No results for input(s): LABPROT, INR in the last 72 hours. ABG No results for input(s): PHART, HCO3 in the last 72 hours.  Invalid input(s): PCO2, PO2  Studies/Results: No results found.  Anti-infectives: Anti-infectives (From admission, onward)   Start     Dose/Rate Route Frequency Ordered Stop   03/12/18 1800  vancomycin (VANCOCIN) IVPB 1000 mg/200 mL premix     1,000 mg 200 mL/hr over 60 Minutes Intravenous Every 12 hours 03/11/18 1930     03/07/18 1800  vancomycin (VANCOCIN) 1,500 mg in sodium chloride 0.9 % 500 mL IVPB  Status:  Discontinued     1,500 mg 250 mL/hr over 120 Minutes Intravenous Every 24 hours 03/06/18 1731 03/07/18 1324   03/07/18 1600  vancomycin (VANCOCIN) 1,750 mg in sodium chloride 0.9 % 500 mL IVPB  Status:  Discontinued     1,750 mg 250 mL/hr over 120 Minutes Intravenous Every 24 hours 03/07/18 1324 03/11/18 1930   03/06/18 1800  vancomycin (VANCOCIN) 1,750 mg in sodium chloride 0.9 % 500 mL IVPB     1,750 mg 250 mL/hr over 120 Minutes Intravenous  Once 03/06/18 1721  03/06/18 2053   03/06/18 0800  ceFAZolin (ANCEF) IVPB 1 g/50 mL premix  Status:  Discontinued     1 g 100 mL/hr over 30 Minutes Intravenous Every 8 hours 03/06/18 0723 03/06/18 1721   03/05/18 1645  vancomycin (VANCOCIN) IVPB 1000 mg/200 mL premix     1,000 mg 200 mL/hr over 60 Minutes Intravenous  Once 03/05/18 1632 03/05/18 1916   03/05/18 1600  ceFAZolin (ANCEF) IVPB 1 g/50 mL premix     1 g 100 mL/hr over 30 Minutes Intravenous  Once 03/05/18 1548 03/05/18 1820      Assessment/Plan: Infected L inguinal wound s/p Procedure(s): IRRIGATION AND DEBRIDEMENT LEFT INGUINAL INCISION (Left)  Induration/cellulitis has had some improvement since yesterday I think unfortunately he still needs IV abx.  If cont to do well, plan dc Monday on oral abx and NWPT   LOS: 7 days    Greer Pickerel 03/12/2018

## 2018-03-12 NOTE — Progress Notes (Signed)
I have reviewed and concur with this student's documentation.   

## 2018-03-13 MED ORDER — SULFAMETHOXAZOLE-TRIMETHOPRIM 800-160 MG PO TABS: 1.0000 | ORAL_TABLET | Freq: Two times a day (BID) | ORAL | 1 refills | Status: DC

## 2018-03-13 MED ORDER — ALUM & MAG HYDROXIDE-SIMETH 200-200-20 MG/5ML PO SUSP
30.0000 mL | Freq: Four times a day (QID) | ORAL | Status: DC | PRN
  Administered 2018-03-13: 30 mL via ORAL
  Filled 2018-03-13: qty 30

## 2018-03-13 MED ORDER — OXYCODONE HCL 5 MG PO TABS: 5.0000 mg | ORAL_TABLET | Freq: Four times a day (QID) | ORAL | 0 refills | Status: DC | PRN

## 2018-03-13 NOTE — Progress Notes (Signed)
Wound vac sponge changed and new home vac applied per Santiago Glad from [facility]. Santiago Glad reviewed vac instructions with patient. Donne Hazel, RN

## 2018-03-13 NOTE — Discharge Instructions (Signed)
Call [facility] [phone] for  -Questions -uncontrolled pain -Temperature >101 -persistent nausea/vomiting -worsening redness        Managing Your Pain After Surgery Without Opioids    Thank you for participating in our program to help patients manage their pain after surgery without opioids. This is part of our effort to provide you with the best care possible, without exposing you or your family to the risk that opioids pose.  What pain can I expect after surgery? You can expect to have some pain after surgery. This is normal. The pain is typically worse the day after surgery, and quickly begins to get better. Many studies have found that many patients are able to manage their pain after surgery with Over-the-Counter (OTC) medications such as Tylenol and Motrin. If you have a condition that does not allow you to take Tylenol or Motrin, notify your surgical team.  How will I manage my pain? The best strategy for controlling your pain after surgery is around the clock pain control with Tylenol (acetaminophen) and Motrin (ibuprofen or Advil). Alternating these medications with each other allows you to maximize your pain control. In addition to Tylenol and Motrin, you can use heating pads or ice packs on your incisions to help reduce your pain.  How will I alternate your regular strength over-the-counter pain medication? You will take a dose of pain medication every three hours. ; Start by taking 650 mg of Tylenol (2 pills of 325 mg) ; 3 hours later take 600 mg of Motrin (3 pills of 200 mg) ; 3 hours after taking the Motrin take 650 mg of Tylenol ; 3 hours after that take 600 mg of Motrin.   - 1 -  See example - if your first dose of Tylenol is at 12:00 PM   12:00 PM Tylenol 650 mg (2 pills of 325 mg)  3:00 PM Motrin 600 mg (3 pills of 200 mg)  6:00 PM Tylenol 650 mg (2 pills of 325 mg)  9:00 PM Motrin 600 mg (3 pills of 200 mg)  Continue alternating  every 3 hours   We recommend that you follow this schedule around-the-clock for at least 3 days after surgery, or until you feel that it is no longer needed. Use the table on the last page of this handout to keep track of the medications you are taking. Important: Do not take more than 3000mg  of Tylenol or 3200mg  of Motrin in a 24-hour period. Do not take ibuprofen/Motrin if you have a history of bleeding stomach ulcers, severe kidney disease, &/or actively taking a blood thinner  What if I still have pain? If you have pain that is not controlled with the over-the-counter pain medications (Tylenol and Motrin or Advil) you might have what we call breakthrough pain. You will receive a prescription for a small amount of an opioid pain medication such as Oxycodone, Tramadol, or Tylenol with Codeine. Use these opioid pills in the first 24 hours after surgery if you have breakthrough pain. Do not take more than 1 pill every 4-6 hours.  If you still have uncontrolled pain after using all opioid pills, don't hesitate to call our staff using the number provided. We will help make sure you are managing your pain in the best way possible, and if necessary, we can provide a prescription for additional pain medication.   Day 1    Time  Name of Medication Number of pills taken  Amount of Acetaminophen  Pain Level  Comments  AM PM       AM PM       AM PM       AM PM       AM PM       AM PM       AM PM       AM PM       Total Daily amount of Acetaminophen Do not take more than  3,000 mg per day      Day 2    Time  Name of Medication Number of pills taken  Amount of Acetaminophen  Pain Level   Comments  AM PM       AM PM       AM PM       AM PM       AM PM       AM PM       AM PM       AM PM       Total Daily amount of Acetaminophen Do not take more than  3,000 mg per day      Day 3    Time  Name of Medication Number of pills taken  Amount of Acetaminophen  Pain Level    Comments  AM PM       AM PM       AM PM       AM PM          AM PM       AM PM       AM PM       AM PM       Total Daily amount of Acetaminophen Do not take more than  3,000 mg per day      Day 4    Time  Name of Medication Number of pills taken  Amount of Acetaminophen  Pain Level   Comments  AM PM       AM PM       AM PM       AM PM       AM PM       AM PM       AM PM       AM PM       Total Daily amount of Acetaminophen Do not take more than  3,000 mg per day      Day 5    Time  Name of Medication Number of pills taken  Amount of Acetaminophen  Pain Level   Comments  AM PM       AM PM       AM PM       AM PM       AM PM       AM PM       AM PM       AM PM       Total Daily amount of Acetaminophen Do not take more than  3,000 mg per day       Day 6    Time  Name of Medication Number of pills taken  Amount of Acetaminophen  Pain Level  Comments  AM PM       AM PM       AM PM       AM PM       AM PM       AM PM       AM  PM       AM PM       Total Daily amount of Acetaminophen Do not take more than  3,000 mg per day      Day 7    Time  Name of Medication Number of pills taken  Amount of Acetaminophen  Pain Level   Comments  AM PM       AM PM       AM PM       AM PM       AM PM       AM PM       AM PM       AM PM       Total Daily amount of Acetaminophen Do not take more than  3,000 mg per day        For additional information about how and where to safely dispose of unused opioid medications - [employer]  Disclaimer: This document contains information and/or instructional materials adapted from [place] for the typical patient with your condition. It does not replace medical advice from your health care provider because your experience may differ from that of the typical patient. Talk to your health care provider if you have any questions about this document, your condition or your  treatment plan. Adapted from [place] Negative pressure wound therapy (NPWT) is a device that helps wounds heal. NPWT helps the wound stay clean and healthy while it heals from the inside. NPWT uses a bandage (dressing) that is made of a sponge or gauze-like material. The dressing is placed in or inside the wound. The wound is then covered and sealed with a cover dressing that sticks to your skin (adhesive). This keeps air out. A tube connects the cover dressing to a small pump. The pump sucks fluid and germs from the wound. The pump also controls any odor coming from the wound. What are the risks? NPWT is usually safe to use. The most common problem is skin irritation from the dressing adhesive, but there are many ways to prevent this from happening. However, more serious problems can develop, such as:  Bleeding.  Infection.  Dehydration.  Pain.  How to change your dressing How often you change your dressing depends on your wound. If the pump is off for more than two hours, the dressing will need to be changed. Follow your health care providers instructions on how often to change it. Your health care provider may change your dressing, or a family member, friend, or caregiver may be shown how to change the dressing. It is important to:  Wear gloves and protective clothing while changing a dressing. This may include eye protection.  Never let anyone change your dressing if he or she has an infection, skin condition, or skin wound or cut of any size.  Preparing to change your dressing  If needed, take pain medicine 30 minutes before the dressing change as prescribed by your health care provider.  Set up a clean station for wound care. You will need: ? A disposable garbage bag that is open and ready to use. ? Hand sanitizer. ? Wound cleanser or saltwater solution (saline) as told by your health care provider. ? New dressing  material or bandages. Make sure to open the dressing package so that the dressing remains on the inside of the package. You may also need the following in your clean station:  A box of vinyl gloves.  Tape.  Skin protectant. This may be a wipe, film, or spray.  Clean or germ-free (sterile) scissors.  Wound liner.  Cotton tip applicators.  Removing your old dressing  Wash your hands with soap and water. Dry your hands with a clean towel. If soap and water are not available, use hand sanitizer.  Put on gloves.  Turn off the pump and disconnect the tubing from the dressing.  Carefully remove the adhesive cover dressing in the direction of your hair growth. Only touch the outside edges of the dressing.  Remove the dressing that is inside the wound. If the dressing sticks, use a wound cleanser or saline solution to wet the dressing. This helps it come off more easily.  Throw the old dressing supplies into the ready garbage bag.  Remove your gloves by grabbing the cuff and turning the glove inside out. Place the gloves in the trash immediately.  Wash your hands with soap and water. Dry your hands with a clean towel. If soap and water are not available, use hand sanitizer. Cleaning your wound  Follow your health care provider's instructions on how to clean your wound. This may include using a saline or recommended wound cleanser.  Do not use over-the-counter medicated or antiseptic creams, sprays, liquids, or dressings unless told to do so by your health care provider.  Clean the area thoroughly with the recommended saline solution or wound cleanser and a clean gauze pad.  Throw the gauze pad into the garbage bag.  Wash your hands with soap and water. Dry your hands with a clean towel. If soap and water are not available, use hand sanitizer. Applying the dressing  Apply a skin protectant to any skin that will be exposed to adhesive. Let the skin protectant dry.  Put a new  dressing into the wound.  Apply a new cover dressing and tube.  Take off your gloves. Put them in the plastic bag with the old dressing. Tie the bag shut and throw it away.  Wash your hands with soap and water. Dry your hands with a clean towel. If soap and water are not available, use hand sanitizer.  Attach the suction and turn the pump back on. Do not change the settings on the machine without talking to a health care provider.  Replace the container in the pump that collects fluid if it is full. Do this at least once a week. Contact a health care provider if:  You have new pain.  You develop irritation, a rash, or itching around the wound or dressing.  You see new black or yellow tissue in your wound.  The dressing changes are painful or cause bleeding.  The pump has been off for more than two hours and you do not know how to change the dressing.  The alarm for the pump goes off and you do not know what to do. Get help right away if:  You have a lot of bleeding.  You see a sudden change in the color or texture of the drainage.  The wound breaks open.  You have severe pain.  You have signs of infection, such as: ? More redness, swelling, or pain. ? More fluid or blood. ? Warmth. ? Pus or a bad smell. ? Red streaks leading from wound. ? A fever. This information is not intended to replace advice given to you by your health care provider. Make sure you discuss any questions you have with your health care provider. Document Released: 06/28/2011  Document Revised: 05/01/2015 Document Reviewed: 01/09/2015 Elsevier Interactive Patient Education  [employer].

## 2018-03-13 NOTE — Care Management Important Message (Signed)
Important Message  Patient Details  Name: Johnny Peck MRN: [id number] Date of Birth: [birth year]   Medicare Important Message Given:  Yes    Kerin Salen 03/13/2018, 12:11 PMImportant Message  Patient Details  Name: Johnny Peck MRN: [id number] Date of Birth: 04-Sep-1945   Medicare Important Message Given:  Yes    Kerin Salen 03/13/2018, 12:11 PM

## 2018-03-13 NOTE — Discharge Summary (Addendum)
Physician Discharge Summary  Johnny Peck WUJ:[id number] DOB: [birth year] DOA: 03/05/2018  PCP: Kathyrn Lass, MD  Admit date: 03/05/2018 Discharge date: 03/13/2018  Recommendations for Outpatient Follow-up:    Follow-up Information    Kathyrn Lass, MD.   Specialty:  [facility] Medicine Contact information: [place] [address] [phone]        Greer Pickerel, MD Follow up in 2 week(s).   Specialty:  General Surgery Why:  for wound check Contact information: [address] [place] [address] [phone]        [facility], [place] Follow up in 1 week(s).   Specialty:  General Surgery Why:  [place] [facility] for wound check Contact information: [place] [address] [phone]          Discharge Diagnoses:  1. Infected left inguinal hernia repair incision 2. H/o open repair of recurrent left inguinal with mesh 02/16/18 3. HTN 4. GERD 5. OA  Surgical Procedure: incision, drainage infected L inguinal hernia incision 03/06/18  Discharge Condition: good Disposition: home with [facility] for wound care  Diet recommendation: regular  Filed Weights   03/05/18 2320  Weight: 88 kg    History of present illness:  The patient is a 72 year old white male who is about 2-1/2 weeks status post left inguinal hernia repair with mesh.  He was doing well until the middle of last week when he developed pain in the left abdominal wall and groin area.  The pain has worsened over the last several days.  The pain is been associated with significant redness.  He has had a small amount of drainage of bloody fluid from the edge of the incision.   Hospital Course:  The patient was admitted for cellulitis and induration of the left inguinal incision.  He had a CT scan which demonstrated significant induration and inflammation but no obvious abscess.  On rounds the day after admission he was found to be draining seropurulent  fluid from the incision.  I taken to the operating room and opened up the old incision.  There was frank drainage of seropurulent fluid.  Cultures were obtained.  The external oblique aponeurosis layer was intact so therefore I did not remove the mesh.  The area was pulsatile lavaged and packed with Kerlix.  He was continued on IV antibiotics.  He ultimately grew out MRSA.  On admission he had significant cellulitis extending to the left hip and proximal left thigh.  The cellulitis also extended to the contralateral hip.  He underwent twice daily dressing changes.  He was maintained on IV vancomycin.  He was kept in the hospital mainly for ongoing cellulitis and induration.  The cellulitis on the contralateral hip resolved.  The cellulitis extending to the left hip gradually improved.  Negative pressure wound therapy device was placed on Friday.  On day of discharge the cellulitis was continuing to improve he still had some induration in the proximal left thigh near the inguinal crease.  The area of cellulitis measured 13 cm x 4 cm.  He was without temperature and not tachycardic.  I felt he was stable for discharge.  I felt that we can convert him to oral antibiotics for the remainder of his treatment  Home health nursing was arranged for wound VAC therapy  We discussed discharge instructions.  He is on chronic tramadol at home twice a day.  I reviewed the [place] drug database.  I did give him  a few tablets of oxycodone for dressing changes.  We discussed the need to maximize pain control with Tylenol.  BP 113/84 (BP Location: Right Arm)   Pulse 66   Temp 98.7 F (37.1 C) (Oral)   Resp 18   Ht 5\' 10"  (1.778 m)   Wt 88 kg   SpO2 95%   BMI 27.84 kg/m   Gen: alert, NAD, non-toxic appearing Pupils: equal, no scleral icterus Pulm: Lungs clear to auscultation, symmetric chest rise CV: regular rate and rhythm Abd: soft, nontender, nondistended.  Ext: no edema, no calf tenderness Skin: no  rash, no jaundice; wound vac in place in L inguinal incision - no surrounding erythema;  prox L thigh/groin crease - induration/cellulitis of 13cm x 4cm.  - improved from yesterday    Discharge Instructions  Discharge Instructions    Call MD for:   Complete by:  As directed    Temperature >101   Call MD for:  hives   Complete by:  As directed    Call MD for:  persistant dizziness or light-headedness   Complete by:  As directed    Call MD for:  persistant nausea and vomiting   Complete by:  As directed    Call MD for:  redness, tenderness, or signs of infection (pain, swelling, redness, odor or green/yellow discharge around incision site)   Complete by:  As directed    Call MD for:  severe uncontrolled pain   Complete by:  As directed    Diet - low sodium heart healthy   Complete by:  As directed    Discharge instructions   Complete by:  As directed    See [facility] discharge instructions   Increase activity slowly   Complete by:  As directed      Allergies as of 03/13/2018   No Known Allergies     Medication List    TAKE these medications   aspirin EC 81 MG tablet Take 81 mg by mouth at bedtime.   CoQ10 200 MG Caps Take 200 mg by mouth 2 (two) times daily.   CRANBERRY PO Take 1 tablet by mouth 2 (two) times daily.   diphenhydramine-acetaminophen 25-500 MG Tabs tablet Commonly known as:  TYLENOL PM Take 2 tablets by mouth at bedtime as needed (sleep).   meloxicam 15 MG tablet Commonly known as:  MOBIC Take 15 mg by mouth at bedtime.   metoprolol succinate 50 MG 24 hr tablet Commonly known as:  TOPROL-XL Take 50 mg by mouth every morning. Take with or immediately following a meal.   multivitamin with minerals Tabs tablet Take 5 tablets by mouth 2 (two) times daily.   MYRBETRIQ 50 MG Tb24 tablet Generic drug:  mirabegron ER Take 50 mg by mouth daily.   OCULAR VITAMINS Tabs Take 1 tablet by mouth daily.   VISION PLUS PO Take 1 tablet by mouth daily.   OIL  OF OREGANO PO Cut open 1 capsule and apply topically as needed for infected pimples   olmesartan 20 MG tablet Commonly known as:  BENICAR Take 20 mg by mouth at bedtime.   omeprazole 20 MG capsule Commonly known as:  PRILOSEC Take 20 mg by mouth every morning.   oxyCODONE 5 MG immediate release tablet Commonly known as:  Oxy IR/ROXICODONE Take 1 tablet (5 mg total) by mouth every 6 (six) hours as needed for breakthrough pain.   Probiotic Caps Take 1 capsule by mouth daily.   PROSTATE HEALTH PO Take 3 tablets  by mouth daily.   psyllium 58.6 % packet Commonly known as:  METAMUCIL Take 1 packet by mouth daily as needed (constipation).   RESVERATROL PO Take 2 tablets by mouth daily.   rosuvastatin 40 MG tablet Commonly known as:  CRESTOR Take 40 mg by mouth at bedtime.   sulfamethoxazole-trimethoprim 800-160 MG tablet Commonly known as:  BACTRIM DS,SEPTRA DS Take 1 tablet by mouth 2 (two) times daily.   tadalafil 5 MG tablet Commonly known as:  CIALIS Take 5 mg by mouth daily.   tamsulosin 0.4 MG Caps capsule Commonly known as:  FLOMAX Take 0.4 mg by mouth at bedtime.   traMADol 50 MG tablet Commonly known as:  ULTRAM Take 100 mg by mouth 2 (two) times daily.   Vitamin D3 125 MCG (5000 UT) Caps Take 5,000 Units by mouth daily.   WELLNESS ESSENTIALS FOR JOINT PO Take 4 tablets by mouth daily.      Follow-up Information    Kathyrn Lass, MD.   Specialty:  [facility] Medicine Contact information: [place] [address] [phone]        Greer Pickerel, MD Follow up in 2 week(s).   Specialty:  General Surgery Why:  for wound check Contact information: [address] [place] [address] [phone]        Sur[facility], [place] Follow up in 1 week(s).   Specialty:  General Surgery Why:  [place] [facility] for wound check Contact information: [place] [address] [phone]            The  results of significant diagnostics from this hospitalization (including imaging, microbiology, ancillary and laboratory) are listed below for reference.    Significant Diagnostic Studies: Ct Abdomen Pelvis W Contrast  Result Date: 03/05/2018 CLINICAL DATA:  Recent left inguinal hernia repair with swelling and erythema around the surgical site, extending into the penis. EXAM: CT ABDOMEN AND PELVIS WITH CONTRAST TECHNIQUE: Multidetector CT imaging of the abdomen and pelvis was performed using the standard protocol following bolus administration of intravenous contrast. CONTRAST:  159mL ISOVUE-300 IOPAMIDOL (ISOVUE-300) INJECTION 61% COMPARISON:  CT abdomen pelvis dated December 25, 2014. FINDINGS: Lower chest: No acute abnormality. New 10 mm pulmonary nodule in the left lower lobe (series 4, image 16). Hepatobiliary: No focal liver abnormality is seen. No gallstones, gallbladder wall thickening, or biliary dilatation. Pancreas: Unremarkable. No pancreatic ductal dilatation or surrounding inflammatory changes. Spleen: Normal in size without focal abnormality. Small splenule near the left adrenal gland, unchanged Adrenals/Urinary Tract: The adrenal glands are unremarkable. Slight interval increase in size of a 2.7 cm simple cyst in the right kidney. No renal or ureteral calculi. No hydronephrosis. Bladder is unremarkable. Stomach/Bowel: Stomach is within normal limits. Appendix appears normal. No evidence of bowel wall thickening, distention, or inflammatory changes. Vascular/Lymphatic: Aortic atherosclerosis. Small reactive left inguinal lymph nodes. No enlarged abdominal or pelvic lymph nodes. Reproductive: Normal prostate.  Small bilateral hydroceles. Other: Prominent fluid and inflammatory changes of the superficial left inguinal fat extending inferiorly into the pubic region and superiorly along the left abdominal wall oblique muscles. No discrete fluid collection. No active contrast extravasation. No  subcutaneous emphysema. Unchanged small to moderate fat containing right inguinal hernia. No pneumoperitoneum or intra-abdominal free fluid. Musculoskeletal: No acute or significant osseous findings. Old left inferior pubic ramus fracture. IMPRESSION: 1. Prominent superficial soft tissue inflammatory changes in the left groin extending inferiorly into the pubic region and superiorly along the left abdominal  wall oblique muscles, most consistent with cellulitis. No discrete abscess. No subcutaneous emphysema to suggest necrotizing infection. No active bleeding. 2. New 10 mm pulmonary nodule in the left lower lobe. Consider one of the following in 3 months for both low-risk and high-risk individuals: (a) repeat chest CT, (b) follow-up PET-CT, or (c) tissue sampling. This recommendation follows the consensus statement: Guidelines for Management of Incidental Pulmonary Nodules Detected on CT Images: From the Fleischner Society 2017; Radiology 2017; 284:228-243. 3.  Aortic atherosclerosis (ICD10-I70.0). Electronically Signed   By: Titus Dubin M.D.   On: 03/05/2018 17:27    Microbiology: Recent Results (from the past 240 hour(s))  Urine culture     Status: Abnormal   Collection Time: 03/05/18 12:43 PM  Result Value Ref Range Status   Specimen Description   Final    URINE, CLEAN CATCH Performed at [facility] [address], [place], [place] [address]    Special Requests   Final    NONE Performed at [facility], [place] [address] [place], [place] [address]    Culture (A)  Final    40,000 COLONIES/mL METHICILLIN RESISTANT STAPHYLOCOCCUS AUREUS   Report Status 03/08/2018 FINAL  Final   Organism ID, Bacteria METHICILLIN RESISTANT STAPHYLOCOCCUS AUREUS (A)  Final      Susceptibility   Methicillin resistant staphylococcus aureus - MIC*    CIPROFLOXACIN <=0.5 SENSITIVE Sensitive     GENTAMICIN <=0.5 SENSITIVE Sensitive     NITROFURANTOIN <=16 SENSITIVE Sensitive      OXACILLIN >=4 RESISTANT Resistant     TETRACYCLINE <=1 SENSITIVE Sensitive     VANCOMYCIN 1 SENSITIVE Sensitive     TRIMETH/SULFA <=10 SENSITIVE Sensitive     CLINDAMYCIN <=0.25 SENSITIVE Sensitive     RIFAMPIN <=0.5 SENSITIVE Sensitive     Inducible Clindamycin NEGATIVE Sensitive     * 40,000 COLONIES/mL METHICILLIN RESISTANT STAPHYLOCOCCUS AUREUS  Blood culture (routine x 2)     Status: None   Collection Time: 03/05/18  3:34 PM  Result Value Ref Range Status   Specimen Description   Final    LEFT ANTECUBITAL Performed at [facility] [address], [place], [place] [address]    Special Requests   Final    BOTTLES DRAWN AEROBIC AND ANAEROBIC Blood Culture results may not be optimal due to an inadequate volume of blood received in culture bottles Performed at [facility] [address], [place], [place] [address]    Culture   Final    NO GROWTH 5 DAYS Performed at [facility], [place] [address], [place], [place] [address]    Report Status 03/11/2018 FINAL  Final  Blood culture (routine x 2)     Status: None   Collection Time: 03/05/18  3:39 PM  Result Value Ref Range Status   Specimen Description   Final    BLOOD LEFT FOREARM Performed at [facility] [address], [place], [place] [address]    Special Requests   Final    BOTTLES DRAWN AEROBIC AND ANAEROBIC Blood Culture adequate volume Performed at [facility] [address], [place], [place] [address]    Culture   Final    NO GROWTH 5 DAYS Performed at [facility], [place] [address], [place], [place] [address]    Report Status 03/10/2018 FINAL  Final  Aerobic/Anaerobic Culture (surgical/deep wound)     Status: None   Collection Time: 03/06/18  3:32 PM  Result Value Ref Range Status   Specimen Description   Final    WOUND Performed at [facility] [address], [place], [place] [address]    Special  Requests   Final    NONE Performed at [facility], [place] [address], [place], [place] [address]    Gram Stain   Final    ABUNDANT WBC PRESENT,BOTH PMN AND MONONUCLEAR MODERATE GRAM POSITIVE COCCI    Culture   Final    MODERATE METHICILLIN RESISTANT STAPHYLOCOCCUS AUREUS NO ANAEROBES ISOLATED Performed at [facility], [place] [address], [place], [place] [address]    Report Status 03/11/2018 FINAL  Final   Organism ID, Bacteria METHICILLIN RESISTANT STAPHYLOCOCCUS AUREUS  Final      Susceptibility   Methicillin resistant staphylococcus aureus - MIC*    CIPROFLOXACIN <=0.5 SENSITIVE Sensitive     ERYTHROMYCIN >=8 RESISTANT Resistant     GENTAMICIN <=0.5 SENSITIVE Sensitive     OXACILLIN >=4 RESISTANT Resistant     TETRACYCLINE <=1 SENSITIVE Sensitive     VANCOMYCIN <=0.5 SENSITIVE Sensitive     TRIMETH/SULFA <=10 SENSITIVE Sensitive     CLINDAMYCIN <=0.25 SENSITIVE Sensitive     RIFAMPIN <=0.5 SENSITIVE Sensitive     Inducible Clindamycin NEGATIVE Sensitive     * MODERATE METHICILLIN RESISTANT STAPHYLOCOCCUS AUREUS  MRSA PCR Screening     Status: Abnormal   Collection Time: 03/08/18  9:56 AM  Result Value Ref Range Status   MRSA by PCR POSITIVE (A) NEGATIVE Final    Comment:        The GeneXpert MRSA Assay (FDA approved for NASAL specimens only), is one component of a comprehensive MRSA colonization surveillance program. It is not intended to diagnose MRSA infection nor to guide or monitor treatment for MRSA infections. RESULT CALLED TO, READ BACK BY AND VERIFIED WITH: BASTIN,M. RN AT 2878 03/08/18 MULLINS,T Performed at [facility], [place] [address] [place],  [address]      Labs: Basic Metabolic Panel: Recent Labs  Lab 03/07/18 0403 03/08/18 0519 03/10/18 0414 03/12/18 0353  NA 137 140  --   --   K 4.7 4.8  --   --   CL 105 105  --   --   CO2 23 28  --   --   GLUCOSE 170* 126*  --   --   BUN 14 21  --   --    CREATININE 0.85 1.01 0.86 1.11  CALCIUM 8.6* 8.9  --   --    Liver Function Tests: No results for input(s): AST, ALT, ALKPHOS, BILITOT, PROT, ALBUMIN in the last 168 hours. No results for input(s): LIPASE, AMYLASE in the last 168 hours. No results for input(s): AMMONIA in the last 168 hours. CBC: Recent Labs  Lab 03/07/18 0403 03/08/18 0519 03/10/18 0414  WBC 14.9* 14.4* 14.5*  HGB 10.6* 10.3* 11.3*  HCT 32.3* 32.0* 35.5*  MCV 96.4 99.1 95.2  PLT 268 304 419*   Cardiac Enzymes: No results for input(s): CKTOTAL, CKMB, CKMBINDEX, TROPONINI in the last 168 hours. BNP: BNP (last 3 results) No results for input(s): BNP in the last 8760 hours.  ProBNP (last 3 results) No results for input(s): PROBNP in the last 8760 hours.  CBG: Recent Labs  Lab 03/12/18 2144  GLUCAP 209*    Active Problems:   Cellulitis   Time coordinating discharge: 25 min  Signed:  [name], MD [facility], [place]  03/13/2018, 9:38 AM

## 2018-03-13 NOTE — Progress Notes (Signed)
Discharge and medication instructions reviewed with patient. Questions answered and patient denies further questions. No prescriptions given. Patient is calling someone to drive him hiome. Donne Hazel, RN

## 2018-03-15 DIAGNOSIS — L03116 Cellulitis of left lower limb: Secondary | ICD-10-CM | POA: Diagnosis not present

## 2018-03-15 DIAGNOSIS — M1990 Unspecified osteoarthritis, unspecified site: Secondary | ICD-10-CM | POA: Diagnosis not present

## 2018-03-15 DIAGNOSIS — K219 Gastro-esophageal reflux disease without esophagitis: Secondary | ICD-10-CM | POA: Diagnosis not present

## 2018-03-15 DIAGNOSIS — Z7982 Long term (current) use of aspirin: Secondary | ICD-10-CM | POA: Diagnosis not present

## 2018-03-15 DIAGNOSIS — I10 Essential (primary) hypertension: Secondary | ICD-10-CM | POA: Diagnosis not present

## 2018-03-15 DIAGNOSIS — T8140XA Infection following a procedure, unspecified, initial encounter: Secondary | ICD-10-CM | POA: Diagnosis not present

## 2018-03-17 DIAGNOSIS — T8140XA Infection following a procedure, unspecified, initial encounter: Secondary | ICD-10-CM | POA: Diagnosis not present

## 2018-03-17 DIAGNOSIS — I10 Essential (primary) hypertension: Secondary | ICD-10-CM | POA: Diagnosis not present

## 2018-03-17 DIAGNOSIS — Z7982 Long term (current) use of aspirin: Secondary | ICD-10-CM | POA: Diagnosis not present

## 2018-03-17 DIAGNOSIS — M1990 Unspecified osteoarthritis, unspecified site: Secondary | ICD-10-CM | POA: Diagnosis not present

## 2018-03-17 DIAGNOSIS — K219 Gastro-esophageal reflux disease without esophagitis: Secondary | ICD-10-CM | POA: Diagnosis not present

## 2018-03-17 DIAGNOSIS — L03116 Cellulitis of left lower limb: Secondary | ICD-10-CM | POA: Diagnosis not present

## 2018-03-20 DIAGNOSIS — T8140XA Infection following a procedure, unspecified, initial encounter: Secondary | ICD-10-CM | POA: Diagnosis not present

## 2018-03-20 DIAGNOSIS — I10 Essential (primary) hypertension: Secondary | ICD-10-CM | POA: Diagnosis not present

## 2018-03-20 DIAGNOSIS — M1990 Unspecified osteoarthritis, unspecified site: Secondary | ICD-10-CM | POA: Diagnosis not present

## 2018-03-20 DIAGNOSIS — K219 Gastro-esophageal reflux disease without esophagitis: Secondary | ICD-10-CM | POA: Diagnosis not present

## 2018-03-20 DIAGNOSIS — Z7982 Long term (current) use of aspirin: Secondary | ICD-10-CM | POA: Diagnosis not present

## 2018-03-20 DIAGNOSIS — L03116 Cellulitis of left lower limb: Secondary | ICD-10-CM | POA: Diagnosis not present

## 2018-03-22 DIAGNOSIS — I10 Essential (primary) hypertension: Secondary | ICD-10-CM | POA: Diagnosis not present

## 2018-03-22 DIAGNOSIS — K219 Gastro-esophageal reflux disease without esophagitis: Secondary | ICD-10-CM | POA: Diagnosis not present

## 2018-03-22 DIAGNOSIS — L03116 Cellulitis of left lower limb: Secondary | ICD-10-CM | POA: Diagnosis not present

## 2018-03-22 DIAGNOSIS — T8140XA Infection following a procedure, unspecified, initial encounter: Secondary | ICD-10-CM | POA: Diagnosis not present

## 2018-03-22 DIAGNOSIS — M1990 Unspecified osteoarthritis, unspecified site: Secondary | ICD-10-CM | POA: Diagnosis not present

## 2018-03-22 DIAGNOSIS — Z7982 Long term (current) use of aspirin: Secondary | ICD-10-CM | POA: Diagnosis not present

## 2018-03-24 DIAGNOSIS — M1990 Unspecified osteoarthritis, unspecified site: Secondary | ICD-10-CM | POA: Diagnosis not present

## 2018-03-24 DIAGNOSIS — K219 Gastro-esophageal reflux disease without esophagitis: Secondary | ICD-10-CM | POA: Diagnosis not present

## 2018-03-24 DIAGNOSIS — I10 Essential (primary) hypertension: Secondary | ICD-10-CM | POA: Diagnosis not present

## 2018-03-24 DIAGNOSIS — T8140XA Infection following a procedure, unspecified, initial encounter: Secondary | ICD-10-CM | POA: Diagnosis not present

## 2018-03-24 DIAGNOSIS — Z7982 Long term (current) use of aspirin: Secondary | ICD-10-CM | POA: Diagnosis not present

## 2018-03-24 DIAGNOSIS — L03116 Cellulitis of left lower limb: Secondary | ICD-10-CM | POA: Diagnosis not present

## 2018-03-27 DIAGNOSIS — M1990 Unspecified osteoarthritis, unspecified site: Secondary | ICD-10-CM | POA: Diagnosis not present

## 2018-03-27 DIAGNOSIS — K219 Gastro-esophageal reflux disease without esophagitis: Secondary | ICD-10-CM | POA: Diagnosis not present

## 2018-03-27 DIAGNOSIS — I10 Essential (primary) hypertension: Secondary | ICD-10-CM | POA: Diagnosis not present

## 2018-03-27 DIAGNOSIS — T8140XA Infection following a procedure, unspecified, initial encounter: Secondary | ICD-10-CM | POA: Diagnosis not present

## 2018-03-27 DIAGNOSIS — Z7982 Long term (current) use of aspirin: Secondary | ICD-10-CM | POA: Diagnosis not present

## 2018-03-27 DIAGNOSIS — L03116 Cellulitis of left lower limb: Secondary | ICD-10-CM | POA: Diagnosis not present

## 2018-03-28 DIAGNOSIS — R911 Solitary pulmonary nodule: Secondary | ICD-10-CM | POA: Diagnosis not present

## 2018-03-28 DIAGNOSIS — L03311 Cellulitis of abdominal wall: Secondary | ICD-10-CM | POA: Diagnosis not present

## 2018-03-28 DIAGNOSIS — Z6828 Body mass index (BMI) 28.0-28.9, adult: Secondary | ICD-10-CM | POA: Diagnosis not present

## 2018-03-29 ENCOUNTER — Other Ambulatory Visit: Payer: Self-pay | Admitting: Family Medicine

## 2018-03-29 DIAGNOSIS — R911 Solitary pulmonary nodule: Secondary | ICD-10-CM

## 2018-03-29 DIAGNOSIS — K219 Gastro-esophageal reflux disease without esophagitis: Secondary | ICD-10-CM | POA: Diagnosis not present

## 2018-03-29 DIAGNOSIS — T8140XA Infection following a procedure, unspecified, initial encounter: Secondary | ICD-10-CM | POA: Diagnosis not present

## 2018-03-29 DIAGNOSIS — I10 Essential (primary) hypertension: Secondary | ICD-10-CM | POA: Diagnosis not present

## 2018-03-29 DIAGNOSIS — M1990 Unspecified osteoarthritis, unspecified site: Secondary | ICD-10-CM | POA: Diagnosis not present

## 2018-03-29 DIAGNOSIS — L03116 Cellulitis of left lower limb: Secondary | ICD-10-CM | POA: Diagnosis not present

## 2018-03-29 DIAGNOSIS — Z7982 Long term (current) use of aspirin: Secondary | ICD-10-CM | POA: Diagnosis not present

## 2018-03-31 DIAGNOSIS — K219 Gastro-esophageal reflux disease without esophagitis: Secondary | ICD-10-CM | POA: Diagnosis not present

## 2018-03-31 DIAGNOSIS — T8140XA Infection following a procedure, unspecified, initial encounter: Secondary | ICD-10-CM | POA: Diagnosis not present

## 2018-03-31 DIAGNOSIS — L03116 Cellulitis of left lower limb: Secondary | ICD-10-CM | POA: Diagnosis not present

## 2018-03-31 DIAGNOSIS — M1990 Unspecified osteoarthritis, unspecified site: Secondary | ICD-10-CM | POA: Diagnosis not present

## 2018-03-31 DIAGNOSIS — Z7982 Long term (current) use of aspirin: Secondary | ICD-10-CM | POA: Diagnosis not present

## 2018-03-31 DIAGNOSIS — I10 Essential (primary) hypertension: Secondary | ICD-10-CM | POA: Diagnosis not present

## 2018-04-03 DIAGNOSIS — M1990 Unspecified osteoarthritis, unspecified site: Secondary | ICD-10-CM | POA: Diagnosis not present

## 2018-04-03 DIAGNOSIS — I10 Essential (primary) hypertension: Secondary | ICD-10-CM | POA: Diagnosis not present

## 2018-04-03 DIAGNOSIS — L03116 Cellulitis of left lower limb: Secondary | ICD-10-CM | POA: Diagnosis not present

## 2018-04-03 DIAGNOSIS — K219 Gastro-esophageal reflux disease without esophagitis: Secondary | ICD-10-CM | POA: Diagnosis not present

## 2018-04-03 DIAGNOSIS — Z7982 Long term (current) use of aspirin: Secondary | ICD-10-CM | POA: Diagnosis not present

## 2018-04-03 DIAGNOSIS — T8140XA Infection following a procedure, unspecified, initial encounter: Secondary | ICD-10-CM | POA: Diagnosis not present

## 2018-04-28 DIAGNOSIS — Z3009 Encounter for other general counseling and advice on contraception: Secondary | ICD-10-CM | POA: Diagnosis not present

## 2018-05-29 ENCOUNTER — Ambulatory Visit
Admission: RE | Admit: 2018-05-29 | Discharge: 2018-05-29 | Disposition: A | Discharge status: Home / Self Care | Payer: Medicare Other | Source: Ambulatory Visit | Attending: Family Medicine | Admitting: Family Medicine

## 2018-05-29 DIAGNOSIS — R911 Solitary pulmonary nodule: Secondary | ICD-10-CM | POA: Diagnosis not present

## 2018-06-15 ENCOUNTER — Other Ambulatory Visit: Payer: Self-pay

## 2018-06-15 ENCOUNTER — Institutional Professional Consult (permissible substitution) (INDEPENDENT_AMBULATORY_CARE_PROVIDER_SITE_OTHER): Payer: Medicare Other | Admitting: Cardiothoracic Surgery

## 2018-06-15 ENCOUNTER — Encounter: Payer: Self-pay | Admitting: Cardiothoracic Surgery

## 2018-06-15 ENCOUNTER — Other Ambulatory Visit: Payer: Self-pay | Admitting: *Deleted

## 2018-06-15 VITALS — BP 127/86 | HR 50 | Resp 18 | Ht 70.0 in | Wt 195.4 lb

## 2018-06-15 DIAGNOSIS — R911 Solitary pulmonary nodule: Secondary | ICD-10-CM | POA: Diagnosis not present

## 2018-06-15 NOTE — Progress Notes (Unsigned)
pet

## 2018-06-15 NOTE — Patient Instructions (Signed)
Pulmonary Nodule A pulmonary nodule is a small, round growth of tissue in the lung. It is sometimes referred to as a "shadow" or "spot on the lung." Nodules range in size from less than 1/5 of an inch (4 mm) to a little bigger than an inch (30 mm). Pulmonary nodules can be either noncancerous (benign) or cancerous (malignant). Most are noncancerous. Smaller nodules in people who do not smoke and do not have any other risk factors for lung cancer are more likely to be noncancerous. Larger, irregular nodules in people who smoke or who have a strong family history of lung cancer are more likely to be cancerous. What are the causes? This condition may be caused by:  A bacterial, fungal, or viral infection, such as tuberculosis. The infection is usually an old and inactive one.  A noncancerous mass of tissue.  Inflammation from conditions such as rheumatoid arthritis.  Abnormal blood vessels in the lungs.  Cancerous tissue, such as lung cancer or a cancer in another part of the body that has spread to the lung. What are the signs or symptoms? This condition usually does not cause symptoms. If symptoms appear, they are usually related to the underlying cause. For example, if the condition is caused by an infection, you may have a cough or fever. How is this diagnosed? This condition is usually diagnosed with an X-ray or CT scan. To help determine whether a pulmonary nodule is benign or malignant, your health care provider will:  Take your medical history.  Perform a physical exam.  Order tests, including: ? Blood tests. ? A skin test called a tuberculin test. This test is done to check if you have been exposed to the germ that causes tuberculosis. ? Chest X-rays. ? A CT scan. This test shows smaller pulmonary nodules more clearly and with more detail than an X-ray. ? A positron emission tomography (PET) scan. This test is done to check if the nodule is cancerous. During the test, a safe amount  of a radioactive substance is injected into the bloodstream. Then a picture is taken. ? Biopsy. In this test, a tiny piece of the pulmonary nodule is removed and then examined under a microscope. How is this treated? Treatment for this condition depends on whether the pulmonary nodule is malignant or benign as well as your risk of getting cancer.  Noncancerous nodules usually do not need to be treated, but they may need to be monitored with CT scans. If a CT scan shows that the pulmonary nodule got bigger, more tests may be done.  Some nodules need to be removed. If this is the case, you may have a procedure called a thoractomy. During the procedure, your health care provider will make an incision in your chest and remove the part of the lung where the nodule is located. Follow these instructions at home:   Take over-the-counter and prescription medicines only as told by your health care provider.  Do not use any products that contain nicotine or tobacco, such as cigarettes and e-cigarettes. If you need help quitting, ask your health care provider.  Keep all follow-up visits as told by your health care provider. This is important. Contact a health care provider if:  You have trouble breathing when you are active.  You feel sick or unusually tired.  You do not feel like eating.  You lose weight without trying.  You develop chills or night sweats. Get help right away if:  You cannot catch your breath.    You begin wheezing.  You cannot stop coughing.  You cough up blood.  You become dizzy or feel like you are going to faint.  You have sudden chest pain.  You have a fever or persistent symptoms for more than 2-3 days.  You have a fever and your symptoms suddenly get worse. Summary  A pulmonary nodule is a small, round growth of tissue in the lung. Most pulmonary nodules are noncancerous.  This condition is usually diagnosed with an X-ray or CT scan.  Common causes of  pulmonary nodules include infection, inflammation, and noncancerous growths.  Though less common, if a nodule is found to be cancerous, you will need specific diagnostic tests and treatment options as directed by your medical provider.  Treatment for this condition depends on whether the pulmonary nodule is benign or malignant as well as your risk of getting cancer. This information is not intended to replace advice given to you by your health care provider. Make sure you discuss any questions you have with your health care provider. Document Released: 01/31/2009 Document Revised: 05/04/2016 Document Reviewed: 05/04/2016 Elsevier Interactive Patient Education  2019 [employer]  
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
## 2018-06-27 ENCOUNTER — Other Ambulatory Visit: Payer: Self-pay

## 2018-06-27 ENCOUNTER — Encounter: Payer: Self-pay | Admitting: Cardiothoracic Surgery

## 2018-06-27 ENCOUNTER — Ambulatory Visit (HOSPITAL_COMMUNITY)
Admission: RE | Admit: 2018-06-27 | Discharge: 2018-06-27 | Disposition: A | Discharge status: Home / Self Care | Payer: Medicare Other | Source: Ambulatory Visit | Attending: Cardiothoracic Surgery | Admitting: Cardiothoracic Surgery

## 2018-06-27 ENCOUNTER — Ambulatory Visit (INDEPENDENT_AMBULATORY_CARE_PROVIDER_SITE_OTHER): Payer: Medicare Other | Admitting: Cardiothoracic Surgery

## 2018-06-27 VITALS — BP 148/78 | HR 62 | Resp 18 | Ht 70.0 in | Wt 192.8 lb

## 2018-06-27 DIAGNOSIS — R911 Solitary pulmonary nodule: Secondary | ICD-10-CM

## 2018-06-27 DIAGNOSIS — R918 Other nonspecific abnormal finding of lung field: Secondary | ICD-10-CM | POA: Diagnosis not present

## 2018-06-27 LAB — PULMONARY FUNCTION TEST
DL/VA % pred: 96 %
DL/VA: 3.89 ml/min/mmHg/L
DLCO unc % pred: 110 %
DLCO unc: 28.36 ml/min/mmHg
FEF 25-75 Post: 4.81 L/sec
FEF 25-75 Pre: 3.71 L/sec
FEF2575-%Change-Post: 29 %
FEF2575-%Pred-Post: 198 %
FEF2575-%Pred-Pre: 152 %
FEV1-%Change-Post: 10 %
FEV1-%Pred-Post: 117 %
FEV1-%Pred-Pre: 106 %
FEV1-Post: 3.77 L
FEV1-Pre: 3.42 L
FEV1FVC-%Change-Post: 2 %
FEV1FVC-%Pred-Pre: 110 %
FEV6-%Change-Post: 8 %
FEV6-%Pred-Post: 109 %
FEV6-%Pred-Pre: 100 %
FEV6-Post: 4.51 L
FEV6-Pre: 4.18 L
FEV6FVC-%Change-Post: 0 %
FEV6FVC-%Pred-Post: 105 %
FEV6FVC-%Pred-Pre: 104 %
FVC-%Change-Post: 7 %
FVC-%Pred-Post: 103 %
FVC-%Pred-Pre: 96 %
FVC-Post: 4.56 L
FVC-Pre: 4.23 L
Post FEV1/FVC ratio: 83 %
Post FEV6/FVC ratio: 99 %
Pre FEV1/FVC ratio: 81 %
Pre FEV6/FVC Ratio: 99 %
RV % pred: 114 %
RV: 2.83 L
TLC % pred: 105 %
TLC: 7.39 L

## 2018-06-27 LAB — GLUCOSE, CAPILLARY: Glucose-Capillary: 111 mg/dL — ABNORMAL HIGH (ref 70–99)

## 2018-06-27 MED ORDER — ALBUTEROL SULFATE (2.5 MG/3ML) 0.083% IN NEBU
2.5000 mg | INHALATION_SOLUTION | Freq: Once | RESPIRATORY_TRACT | Status: AC
  Administered 2018-06-27: 2.5 mg via RESPIRATORY_TRACT

## 2018-06-27 MED ORDER — FLUDEOXYGLUCOSE F - 18 (FDG) INJECTION
9.7800 | Freq: Once | INTRAVENOUS | Status: AC | PRN
  Administered 2018-06-27: 9.78 via INTRAVENOUS
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
## 2018-06-30 NOTE — Pre-Procedure Instructions (Signed)
Millie Shorb Chisum  06/30/2018      [id number] [address] [place] [address] Phone: [phone] Fax: [phone]    Your procedure is scheduled on March 18  Report to [facility] at [place].M.  Call this number if you have problems the morning of surgery:  [phone]   Remember:  Do not eat or drink after midnight.    Take these medicines the morning of surgery with A SIP OF WATER  metoprolol succinate (TOPROL-XL)  mirabegron ER (MYRBETRIQ) omeprazole (PRILOSEC) traMADol (ULTRAM)  7 days prior to surgery STOP taking any meloxicam (MOBIC), Aspirin (unless otherwise instructed by your surgeon), Aleve, Naproxen, Ibuprofen, Motrin, Advil, Goody's, BC's, all herbal medications, fish oil, and all vitamins.  Follow your surgeon's instructions on when to stop Asprin.  If no instructions were given by your surgeon then you will need to call the office to get those instructions.       Do not wear jewelry  Do not wear lotions, powders, or cologne, or deodorant.   Men may shave face and neck.  Do not bring valuables to the hospital.  [facility] is not responsible for any belongings or valuables.  Contacts, dentures or bridgework may not be worn into surgery.  Leave your suitcase in the car.  After surgery it may be brought to your room.  For patients admitted to the hospital, discharge time will be determined by your treatment team.  Patients discharged the day of surgery will not be allowed to drive home.   Special instructions:   [facility]- Preparing For Surgery  Before surgery, you can play an important role. Because skin is not sterile, your skin needs to be as free of germs as possible. You can reduce the number of germs on your skin by washing with CHG (chlorahexidine gluconate) Soap before surgery.  CHG is an antiseptic cleaner which kills germs and bonds with the skin to continue killing germs  even after washing.    Oral Hygiene is also important to reduce your risk of infection.  Remember - BRUSH YOUR TEETH THE MORNING OF SURGERY WITH YOUR REGULAR TOOTHPASTE  Please do not use if you have an allergy to CHG or antibacterial soaps. If your skin becomes reddened/irritated stop using the CHG.  Do not shave (including legs and underarms) for at least 48 hours prior to first CHG shower. It is OK to shave your face.  Please follow these instructions carefully.   1. Shower the NIGHT BEFORE SURGERY and the MORNING OF SURGERY with CHG.   2. If you chose to wash your hair, wash your hair first as usual with your normal shampoo.  3. After you shampoo, rinse your hair and body thoroughly to remove the shampoo.  4. Use CHG as you would any other liquid soap. You can apply CHG directly to the skin and wash gently with a scrungie or a clean washcloth.   5. Apply the CHG Soap to your body ONLY FROM THE NECK DOWN.  Do not use on open wounds or open sores. Avoid contact with your eyes, ears, mouth and genitals (private parts). Wash Face and genitals (private parts)  with your normal soap.  6. Wash thoroughly, paying special attention to the area where your surgery will be performed.  7. Thoroughly rinse your body with warm water from the neck down.  8. DO NOT shower/wash with your normal soap after using and  rinsing off the CHG Soap.  9. Pat yourself dry with a CLEAN TOWEL.  10. Wear CLEAN PAJAMAS to bed the night before surgery, wear comfortable clothes the morning of surgery  11. Place CLEAN SHEETS on your bed the night of your first shower and DO NOT SLEEP WITH PETS.    Day of Surgery:  Do not apply any deodorants/lotions.  Please wear clean clothes to the hospital/surgery center.   Remember to brush your teeth WITH YOUR REGULAR TOOTHPASTE.    Please read over the following fact sheets that you were given.

## 2018-07-03 ENCOUNTER — Ambulatory Visit (HOSPITAL_COMMUNITY): Payer: Medicare Other

## 2018-07-03 ENCOUNTER — Other Ambulatory Visit: Payer: Self-pay

## 2018-07-03 ENCOUNTER — Ambulatory Visit (HOSPITAL_COMMUNITY)
Admission: RE | Admit: 2018-07-03 | Discharge: 2018-07-03 | Disposition: A | Discharge status: Home / Self Care | Payer: Medicare Other | Source: Ambulatory Visit | Attending: Cardiothoracic Surgery | Admitting: Cardiothoracic Surgery

## 2018-07-03 ENCOUNTER — Encounter (HOSPITAL_COMMUNITY)
Admission: RE | Admit: 2018-07-03 | Discharge: 2018-07-03 | Disposition: A | Discharge status: Home / Self Care | Payer: Medicare Other | Source: Ambulatory Visit | Attending: Cardiothoracic Surgery | Admitting: Cardiothoracic Surgery

## 2018-07-03 ENCOUNTER — Encounter (HOSPITAL_COMMUNITY): Payer: Self-pay

## 2018-07-03 DIAGNOSIS — K219 Gastro-esophageal reflux disease without esophagitis: Secondary | ICD-10-CM | POA: Diagnosis not present

## 2018-07-03 DIAGNOSIS — R911 Solitary pulmonary nodule: Secondary | ICD-10-CM

## 2018-07-03 DIAGNOSIS — Z9852 Vasectomy status: Secondary | ICD-10-CM | POA: Diagnosis not present

## 2018-07-03 DIAGNOSIS — I10 Essential (primary) hypertension: Secondary | ICD-10-CM | POA: Diagnosis not present

## 2018-07-03 DIAGNOSIS — D62 Acute posthemorrhagic anemia: Secondary | ICD-10-CM | POA: Diagnosis not present

## 2018-07-03 DIAGNOSIS — I2510 Atherosclerotic heart disease of native coronary artery without angina pectoris: Secondary | ICD-10-CM | POA: Diagnosis not present

## 2018-07-03 DIAGNOSIS — Z572 Occupational exposure to dust: Secondary | ICD-10-CM | POA: Diagnosis not present

## 2018-07-03 DIAGNOSIS — Z01818 Encounter for other preprocedural examination: Secondary | ICD-10-CM | POA: Diagnosis not present

## 2018-07-03 DIAGNOSIS — F419 Anxiety disorder, unspecified: Secondary | ICD-10-CM | POA: Diagnosis not present

## 2018-07-03 DIAGNOSIS — Z9841 Cataract extraction status, right eye: Secondary | ICD-10-CM | POA: Diagnosis not present

## 2018-07-03 DIAGNOSIS — E785 Hyperlipidemia, unspecified: Secondary | ICD-10-CM | POA: Diagnosis not present

## 2018-07-03 DIAGNOSIS — M1990 Unspecified osteoarthritis, unspecified site: Secondary | ICD-10-CM | POA: Diagnosis not present

## 2018-07-03 DIAGNOSIS — Z87891 Personal history of nicotine dependence: Secondary | ICD-10-CM | POA: Diagnosis not present

## 2018-07-03 DIAGNOSIS — J8410 Pulmonary fibrosis, unspecified: Secondary | ICD-10-CM | POA: Diagnosis not present

## 2018-07-03 HISTORY — DX: Serous retinal detachment, unspecified eye: H33.20

## 2018-07-03 HISTORY — DX: Hyperlipidemia, unspecified: E78.5

## 2018-07-03 LAB — URINALYSIS, ROUTINE W REFLEX MICROSCOPIC
Bilirubin Urine: NEGATIVE
Glucose, UA: NEGATIVE mg/dL
Hgb urine dipstick: NEGATIVE
Ketones, ur: NEGATIVE mg/dL
Leukocytes,Ua: NEGATIVE
Nitrite: NEGATIVE
Protein, ur: NEGATIVE mg/dL
Specific Gravity, Urine: 1.004 — ABNORMAL LOW (ref 1.005–1.030)
pH: 6 (ref 5.0–8.0)

## 2018-07-03 LAB — TYPE AND SCREEN
ABO/RH(D): O POS
Antibody Screen: NEGATIVE

## 2018-07-03 LAB — COMPREHENSIVE METABOLIC PANEL
ALT: 44 U/L (ref 0–44)
AST: 32 U/L (ref 15–41)
Albumin: 4.1 g/dL (ref 3.5–5.0)
Alkaline Phosphatase: 53 U/L (ref 38–126)
Anion gap: 9 (ref 5–15)
BUN: 19 mg/dL (ref 8–23)
CO2: 21 mmol/L — ABNORMAL LOW (ref 22–32)
Calcium: 9.3 mg/dL (ref 8.9–10.3)
Chloride: 108 mmol/L (ref 98–111)
Creatinine, Ser: 0.91 mg/dL (ref 0.61–1.24)
GFR calc Af Amer: >60 mL/min (ref >60)
GFR calc non Af Amer: >60 mL/min (ref >60)
Glucose, Bld: 106 mg/dL — ABNORMAL HIGH (ref 70–99)
Potassium: 4.1 mmol/L (ref 3.5–5.1)
Sodium: 138 mmol/L (ref 135–145)
Total Bilirubin: 0.6 mg/dL (ref 0.3–1.2)
Total Protein: 6.9 g/dL (ref 6.5–8.1)

## 2018-07-03 LAB — CBC
HCT: 40.5 % (ref 39.0–52.0)
Hemoglobin: 13.3 g/dL (ref 13.0–17.0)
MCH: 30.8 pg (ref 26.0–34.0)
MCHC: 32.8 g/dL (ref 30.0–36.0)
MCV: 93.8 fL (ref 80.0–100.0)
Platelets: 167 10*3/uL (ref 150–400)
RBC: 4.32 MIL/uL (ref 4.22–5.81)
RDW: 12.4 % (ref 11.5–15.5)
WBC: 7.4 10*3/uL (ref 4.0–10.5)
nRBC: 0 % (ref 0.0–0.2)

## 2018-07-03 LAB — BLOOD GAS, ARTERIAL
Acid-Base Excess: 0.6 mmol/L (ref 0.0–2.0)
Bicarbonate: 24.6 mmol/L (ref 20.0–28.0)
Drawn by: 470591
FIO2: 21
O2 Saturation: 96.9 %
Patient temperature: 98.6
pCO2 arterial: 39.1 mmHg (ref 32.0–48.0)
pH, Arterial: 7.416 (ref 7.350–7.450)
pO2, Arterial: 91.4 mmHg (ref 83.0–108.0)

## 2018-07-03 LAB — ABO/RH: ABO/RH(D): O POS

## 2018-07-03 LAB — SURGICAL PCR SCREEN
MRSA, PCR: NEGATIVE
Staphylococcus aureus: POSITIVE — AB

## 2018-07-03 LAB — PROTIME-INR
INR: 1.1 (ref 0.8–1.2)
Prothrombin Time: 14.2 seconds (ref 11.4–15.2)

## 2018-07-03 LAB — APTT: aPTT: 29 seconds (ref 24–36)

## 2018-07-03 NOTE — Progress Notes (Signed)
[facility]up[facility] [address] for [facility] of Staph. Pt notified of results and need to pick up Rx. Pt voiced understanding.

## 2018-07-03 NOTE — Progress Notes (Signed)
PCP -  Dr. Kathyrn Lass   LOV 05/2018 Cardiologist -  Denies   Chest x-ray -  07/03/18 EKG - 07/03/18 Stress Test - denies ECHO - denies Cardiac Cath - denies  Sleep Study -  denies CPAP -   Fasting Blood Sugar - n/a Checks Blood Sugar _____ times a day  Blood Thinner Instructions: Aspirin Instructions: last dose was today, 3/16  Anesthesia review:  no  Patient denies shortness of breath, fever, cough and chest pain at PAT appointment   Patient verbalized understanding of instructions that were given to them at the PAT appointment. Patient was also instructed that they will need to review over the PAT instructions again at home before surgery.
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
## 2018-07-05 ENCOUNTER — Other Ambulatory Visit: Payer: Self-pay

## 2018-07-05 ENCOUNTER — Inpatient Hospital Stay (HOSPITAL_COMMUNITY): Payer: Medicare Other | Admitting: Certified Registered Nurse Anesthetist

## 2018-07-05 ENCOUNTER — Inpatient Hospital Stay (HOSPITAL_COMMUNITY)
Admission: RE | Admit: 2018-07-05 | Discharge: 2018-07-09 | DRG: 164 | Disposition: A | Discharge status: Home / Self Care | Payer: Medicare Other | Attending: Cardiothoracic Surgery | Admitting: Cardiothoracic Surgery

## 2018-07-05 ENCOUNTER — Encounter (HOSPITAL_COMMUNITY)
Admission: RE | Disposition: A | Discharge status: Home / Self Care | Payer: Self-pay | Source: Home / Self Care | Attending: Cardiothoracic Surgery

## 2018-07-05 ENCOUNTER — Inpatient Hospital Stay (HOSPITAL_COMMUNITY): Payer: Medicare Other

## 2018-07-05 ENCOUNTER — Encounter (HOSPITAL_COMMUNITY): Payer: Self-pay

## 2018-07-05 DIAGNOSIS — J939 Pneumothorax, unspecified: Secondary | ICD-10-CM | POA: Diagnosis not present

## 2018-07-05 DIAGNOSIS — Z572 Occupational exposure to dust: Secondary | ICD-10-CM

## 2018-07-05 DIAGNOSIS — K219 Gastro-esophageal reflux disease without esophagitis: Secondary | ICD-10-CM | POA: Diagnosis not present

## 2018-07-05 DIAGNOSIS — J941 Fibrothorax: Secondary | ICD-10-CM | POA: Diagnosis not present

## 2018-07-05 DIAGNOSIS — Z87891 Personal history of nicotine dependence: Secondary | ICD-10-CM | POA: Diagnosis not present

## 2018-07-05 DIAGNOSIS — I2510 Atherosclerotic heart disease of native coronary artery without angina pectoris: Secondary | ICD-10-CM | POA: Diagnosis present

## 2018-07-05 DIAGNOSIS — D62 Acute posthemorrhagic anemia: Secondary | ICD-10-CM | POA: Diagnosis not present

## 2018-07-05 DIAGNOSIS — Z9841 Cataract extraction status, right eye: Secondary | ICD-10-CM

## 2018-07-05 DIAGNOSIS — Z8042 Family history of malignant neoplasm of prostate: Secondary | ICD-10-CM

## 2018-07-05 DIAGNOSIS — Z09 Encounter for follow-up examination after completed treatment for conditions other than malignant neoplasm: Secondary | ICD-10-CM

## 2018-07-05 DIAGNOSIS — Z9689 Presence of other specified functional implants: Secondary | ICD-10-CM

## 2018-07-05 DIAGNOSIS — F419 Anxiety disorder, unspecified: Secondary | ICD-10-CM | POA: Diagnosis not present

## 2018-07-05 DIAGNOSIS — Z9852 Vasectomy status: Secondary | ICD-10-CM | POA: Diagnosis not present

## 2018-07-05 DIAGNOSIS — E785 Hyperlipidemia, unspecified: Secondary | ICD-10-CM | POA: Diagnosis not present

## 2018-07-05 DIAGNOSIS — J8410 Pulmonary fibrosis, unspecified: Secondary | ICD-10-CM | POA: Diagnosis not present

## 2018-07-05 DIAGNOSIS — I10 Essential (primary) hypertension: Secondary | ICD-10-CM | POA: Diagnosis present

## 2018-07-05 DIAGNOSIS — R911 Solitary pulmonary nodule: Secondary | ICD-10-CM | POA: Diagnosis present

## 2018-07-05 DIAGNOSIS — R918 Other nonspecific abnormal finding of lung field: Secondary | ICD-10-CM | POA: Diagnosis not present

## 2018-07-05 DIAGNOSIS — Z452 Encounter for adjustment and management of vascular access device: Secondary | ICD-10-CM | POA: Diagnosis not present

## 2018-07-05 DIAGNOSIS — J9811 Atelectasis: Secondary | ICD-10-CM | POA: Diagnosis not present

## 2018-07-05 DIAGNOSIS — M1990 Unspecified osteoarthritis, unspecified site: Secondary | ICD-10-CM | POA: Diagnosis not present

## 2018-07-05 DIAGNOSIS — Z902 Acquired absence of lung [part of]: Secondary | ICD-10-CM

## 2018-07-05 DIAGNOSIS — Z978 Presence of other specified devices: Secondary | ICD-10-CM | POA: Diagnosis not present

## 2018-07-05 DIAGNOSIS — I517 Cardiomegaly: Secondary | ICD-10-CM | POA: Diagnosis not present

## 2018-07-05 DIAGNOSIS — Z8249 Family history of ischemic heart disease and other diseases of the circulatory system: Secondary | ICD-10-CM

## 2018-07-05 HISTORY — DX: Solitary pulmonary nodule: R91.1

## 2018-07-05 HISTORY — PX: VIDEO ASSISTED THORACOSCOPY (VATS)/WEDGE RESECTION: SHX6174

## 2018-07-05 HISTORY — PX: THORACOSCOPY: SUR1347

## 2018-07-05 LAB — GLUCOSE, CAPILLARY
Glucose-Capillary: 130 mg/dL — ABNORMAL HIGH (ref 70–99)
Glucose-Capillary: 122 mg/dL — ABNORMAL HIGH (ref 70–99)
Glucose-Capillary: 124 mg/dL — ABNORMAL HIGH (ref 70–99)

## 2018-07-05 SURGERY — VIDEO ASSISTED THORACOSCOPY (VATS)/WEDGE RESECTION
Anesthesia: General | Site: Chest

## 2018-07-05 MED ORDER — BUPIVACAINE HCL (PF) 0.5 % IJ SOLN
INTRAMUSCULAR | Status: DC | PRN
  Administered 2018-07-05: 30 mL

## 2018-07-05 MED ORDER — SUGAMMADEX SODIUM 200 MG/2ML IV SOLN
INTRAVENOUS | Status: DC | PRN
  Administered 2018-07-05: 200 mg via INTRAVENOUS

## 2018-07-05 MED ORDER — FENTANYL CITRATE (PF) 250 MCG/5ML IJ SOLN
INTRAMUSCULAR | Status: AC
  Filled 2018-07-05: qty 5

## 2018-07-05 MED ORDER — ROCURONIUM BROMIDE 50 MG/5ML IV SOSY
PREFILLED_SYRINGE | INTRAVENOUS | Status: AC
  Filled 2018-07-05: qty 25

## 2018-07-05 MED ORDER — SODIUM CHLORIDE (PF) 0.9 % IJ SOLN
INTRAMUSCULAR | Status: DC | PRN
  Administered 2018-07-05: 50 mL via INTRAVENOUS

## 2018-07-05 MED ORDER — ONDANSETRON HCL 4 MG/2ML IJ SOLN: 4.0000 mg | Freq: Four times a day (QID) | INTRAMUSCULAR | Status: DC | PRN

## 2018-07-05 MED ORDER — PHENYLEPHRINE 40 MCG/ML (10ML) SYRINGE FOR IV PUSH (FOR BLOOD PRESSURE SUPPORT)
PREFILLED_SYRINGE | INTRAVENOUS | Status: AC
  Filled 2018-07-05: qty 20

## 2018-07-05 MED ORDER — DIPHENHYDRAMINE-APAP (SLEEP) 25-500 MG PO TABS: 2.0000 | ORAL_TABLET | Freq: Every day | ORAL | Status: DC

## 2018-07-05 MED ORDER — DEXAMETHASONE SODIUM PHOSPHATE 10 MG/ML IJ SOLN
INTRAMUSCULAR | Status: AC
  Filled 2018-07-05: qty 1

## 2018-07-05 MED ORDER — OXYCODONE HCL 5 MG PO TABS
ORAL_TABLET | ORAL | Status: AC
  Filled 2018-07-05: qty 1

## 2018-07-05 MED ORDER — ONDANSETRON HCL 4 MG/2ML IJ SOLN: 4.0000 mg | Freq: Once | INTRAMUSCULAR | Status: DC | PRN

## 2018-07-05 MED ORDER — TRAMADOL HCL 50 MG PO TABS
50.0000 mg | ORAL_TABLET | Freq: Four times a day (QID) | ORAL | Status: DC | PRN
  Administered 2018-07-06 – 2018-07-08 (×4): 50 mg via ORAL
  Filled 2018-07-05 (×4): qty 1

## 2018-07-05 MED ORDER — LIDOCAINE 2% (20 MG/ML) 5 ML SYRINGE
INTRAMUSCULAR | Status: AC
  Filled 2018-07-05: qty 5

## 2018-07-05 MED ORDER — MIRABEGRON ER 50 MG PO TB24
50.0000 mg | ORAL_TABLET | Freq: Every day | ORAL | Status: DC
  Administered 2018-07-06 – 2018-07-09 (×4): 50 mg via ORAL
  Filled 2018-07-05 (×4): qty 1

## 2018-07-05 MED ORDER — PROPOFOL 10 MG/ML IV BOLUS
INTRAVENOUS | Status: DC | PRN
  Administered 2018-07-05: 200 mg via INTRAVENOUS

## 2018-07-05 MED ORDER — RISAQUAD PO CAPS
1.0000 | ORAL_CAPSULE | Freq: Every day | ORAL | Status: DC
  Administered 2018-07-06 – 2018-07-09 (×4): 1 via ORAL
  Filled 2018-07-05 (×4): qty 1

## 2018-07-05 MED ORDER — ASPIRIN EC 81 MG PO TBEC
81.0000 mg | DELAYED_RELEASE_TABLET | Freq: Every day | ORAL | Status: DC
  Administered 2018-07-05 – 2018-07-08 (×4): 81 mg via ORAL
  Filled 2018-07-05 (×4): qty 1

## 2018-07-05 MED ORDER — LACTATED RINGERS IV SOLN
INTRAVENOUS | Status: DC
  Administered 2018-07-05: 08:00:00 via INTRAVENOUS

## 2018-07-05 MED ORDER — OXYCODONE HCL 5 MG PO TABS
5.0000 mg | ORAL_TABLET | ORAL | Status: DC | PRN
  Administered 2018-07-05 – 2018-07-09 (×9): 5 mg via ORAL
  Filled 2018-07-05 (×9): qty 1

## 2018-07-05 MED ORDER — PHENYLEPHRINE 40 MCG/ML (10ML) SYRINGE FOR IV PUSH (FOR BLOOD PRESSURE SUPPORT)
PREFILLED_SYRINGE | INTRAVENOUS | Status: DC | PRN
  Administered 2018-07-05: 40 ug via INTRAVENOUS
  Administered 2018-07-05 (×3): 80 ug via INTRAVENOUS
  Administered 2018-07-05: 40 ug via INTRAVENOUS
  Administered 2018-07-05 (×4): 80 ug via INTRAVENOUS

## 2018-07-05 MED ORDER — DIPHENHYDRAMINE HCL 25 MG PO CAPS
50.0000 mg | ORAL_CAPSULE | Freq: Every day | ORAL | Status: DC
  Administered 2018-07-05 – 2018-07-07 (×3): 50 mg via ORAL
  Filled 2018-07-05 (×3): qty 2

## 2018-07-05 MED ORDER — FENTANYL 40 MCG/ML IV SOLN
INTRAVENOUS | Status: DC
  Administered 2018-07-05: 140 ug via INTRAVENOUS
  Administered 2018-07-05: 1000 ug via INTRAVENOUS
  Administered 2018-07-05: 230 ug via INTRAVENOUS
  Administered 2018-07-05: 200 ug via INTRAVENOUS
  Administered 2018-07-06: 190 ug via INTRAVENOUS
  Administered 2018-07-06: 08:00:00 via INTRAVENOUS
  Administered 2018-07-06: 120 ug via INTRAVENOUS
  Administered 2018-07-06: 150 ug via INTRAVENOUS
  Administered 2018-07-06: 200 ug via INTRAVENOUS
  Administered 2018-07-06: 250 ug via INTRAVENOUS
  Administered 2018-07-07: 40 ug via INTRAVENOUS
  Administered 2018-07-07: 90 ug via INTRAVENOUS
  Administered 2018-07-07: 70 ug via INTRAVENOUS
  Filled 2018-07-05: qty 1000
  Filled 2018-07-05: qty 25

## 2018-07-05 MED ORDER — MUPIROCIN 2 % EX OINT
TOPICAL_OINTMENT | CUTANEOUS | Status: AC
  Filled 2018-07-05: qty 22

## 2018-07-05 MED ORDER — ENOXAPARIN SODIUM 40 MG/0.4ML ~~LOC~~ SOLN
40.0000 mg | SUBCUTANEOUS | Status: DC
  Administered 2018-07-05 – 2018-07-08 (×4): 40 mg via SUBCUTANEOUS
  Filled 2018-07-05 (×4): qty 0.4

## 2018-07-05 MED ORDER — ACETAMINOPHEN 160 MG/5ML PO SOLN: 1000.0000 mg | Freq: Four times a day (QID) | ORAL | Status: DC

## 2018-07-05 MED ORDER — DEXAMETHASONE SODIUM PHOSPHATE 10 MG/ML IJ SOLN
INTRAMUSCULAR | Status: DC | PRN
  Administered 2018-07-05: 8 mg via INTRAVENOUS

## 2018-07-05 MED ORDER — 0.9 % SODIUM CHLORIDE (POUR BTL) OPTIME
TOPICAL | Status: DC | PRN
  Administered 2018-07-05: 1000 mL

## 2018-07-05 MED ORDER — SENNOSIDES-DOCUSATE SODIUM 8.6-50 MG PO TABS
1.0000 | ORAL_TABLET | Freq: Every day | ORAL | Status: DC
  Administered 2018-07-05 – 2018-07-06 (×2): 1 via ORAL
  Filled 2018-07-05 (×3): qty 1

## 2018-07-05 MED ORDER — NALOXONE HCL 0.4 MG/ML IJ SOLN: 0.4000 mg | INTRAMUSCULAR | Status: DC | PRN

## 2018-07-05 MED ORDER — PANTOPRAZOLE SODIUM 40 MG PO TBEC
40.0000 mg | DELAYED_RELEASE_TABLET | Freq: Every day | ORAL | Status: DC
  Administered 2018-07-06 – 2018-07-09 (×4): 40 mg via ORAL
  Filled 2018-07-05 (×4): qty 1

## 2018-07-05 MED ORDER — POTASSIUM CHLORIDE 10 MEQ/50ML IV SOLN: 10.0000 meq | Freq: Every day | INTRAVENOUS | Status: DC | PRN

## 2018-07-05 MED ORDER — OXYCODONE HCL 5 MG/5ML PO SOLN: 5.0000 mg | Freq: Once | ORAL | Status: AC | PRN

## 2018-07-05 MED ORDER — ACETAMINOPHEN 500 MG PO TABS
1000.0000 mg | ORAL_TABLET | Freq: Four times a day (QID) | ORAL | Status: DC
  Administered 2018-07-05 – 2018-07-09 (×13): 1000 mg via ORAL
  Filled 2018-07-05 (×13): qty 2

## 2018-07-05 MED ORDER — ADULT MULTIVITAMIN W/MINERALS CH
5.0000 | ORAL_TABLET | Freq: Every day | ORAL | Status: DC
  Administered 2018-07-06 – 2018-07-07 (×2): 1 via ORAL
  Filled 2018-07-05 (×3): qty 5

## 2018-07-05 MED ORDER — OXYCODONE HCL 5 MG PO TABS
5.0000 mg | ORAL_TABLET | Freq: Once | ORAL | Status: AC | PRN
  Administered 2018-07-05: 5 mg via ORAL

## 2018-07-05 MED ORDER — BISACODYL 5 MG PO TBEC
10.0000 mg | DELAYED_RELEASE_TABLET | Freq: Every day | ORAL | Status: DC
  Administered 2018-07-06 – 2018-07-09 (×3): 10 mg via ORAL
  Filled 2018-07-05 (×4): qty 2

## 2018-07-05 MED ORDER — DIPHENHYDRAMINE HCL 50 MG/ML IJ SOLN: 12.5000 mg | Freq: Four times a day (QID) | INTRAMUSCULAR | Status: DC | PRN

## 2018-07-05 MED ORDER — SODIUM CHLORIDE 0.9% FLUSH: 9.0000 mL | INTRAVENOUS | Status: DC | PRN

## 2018-07-05 MED ORDER — ROSUVASTATIN CALCIUM 20 MG PO TABS
40.0000 mg | ORAL_TABLET | Freq: Every day | ORAL | Status: DC
  Administered 2018-07-05 – 2018-07-08 (×4): 40 mg via ORAL
  Filled 2018-07-05 (×4): qty 2

## 2018-07-05 MED ORDER — ALPRAZOLAM 0.25 MG PO TABS
0.2500 mg | ORAL_TABLET | Freq: Once | ORAL | Status: AC
  Administered 2018-07-05: 0.25 mg via ORAL
  Filled 2018-07-05: qty 1

## 2018-07-05 MED ORDER — ROCURONIUM BROMIDE 50 MG/5ML IV SOSY
PREFILLED_SYRINGE | INTRAVENOUS | Status: AC
  Filled 2018-07-05: qty 5

## 2018-07-05 MED ORDER — SUCCINYLCHOLINE CHLORIDE 200 MG/10ML IV SOSY
PREFILLED_SYRINGE | INTRAVENOUS | Status: DC | PRN
  Administered 2018-07-05: 160 mg via INTRAVENOUS

## 2018-07-05 MED ORDER — INSULIN ASPART 100 UNIT/ML ~~LOC~~ SOLN
0.0000 [IU] | Freq: Four times a day (QID) | SUBCUTANEOUS | Status: DC
  Administered 2018-07-06 – 2018-07-07 (×2): 2 [IU] via SUBCUTANEOUS

## 2018-07-05 MED ORDER — EPHEDRINE SULFATE-NACL 50-0.9 MG/10ML-% IV SOSY
PREFILLED_SYRINGE | INTRAVENOUS | Status: DC | PRN
  Administered 2018-07-05 (×6): 5 mg via INTRAVENOUS
  Administered 2018-07-05: 10 mg via INTRAVENOUS
  Administered 2018-07-05: 5 mg via INTRAVENOUS

## 2018-07-05 MED ORDER — ONDANSETRON HCL 4 MG/2ML IJ SOLN
INTRAMUSCULAR | Status: AC
  Filled 2018-07-05: qty 2

## 2018-07-05 MED ORDER — BUPIVACAINE HCL 0.5 % IJ SOLN
INTRAMUSCULAR | Status: AC
  Filled 2018-07-05: qty 1

## 2018-07-05 MED ORDER — FENTANYL CITRATE (PF) 100 MCG/2ML IJ SOLN
INTRAMUSCULAR | Status: DC | PRN
  Administered 2018-07-05 (×2): 25 ug via INTRAVENOUS
  Administered 2018-07-05: 100 ug via INTRAVENOUS
  Administered 2018-07-05 (×2): 50 ug via INTRAVENOUS

## 2018-07-05 MED ORDER — FENTANYL CITRATE (PF) 100 MCG/2ML IJ SOLN
25.0000 ug | INTRAMUSCULAR | Status: DC | PRN
  Administered 2018-07-05 (×4): 25 ug via INTRAVENOUS

## 2018-07-05 MED ORDER — ONDANSETRON HCL 4 MG/2ML IJ SOLN
INTRAMUSCULAR | Status: DC | PRN
  Administered 2018-07-05: 4 mg via INTRAVENOUS

## 2018-07-05 MED ORDER — METOPROLOL SUCCINATE ER 25 MG PO TB24
25.0000 mg | ORAL_TABLET | Freq: Every morning | ORAL | Status: DC
  Administered 2018-07-06 – 2018-07-09 (×4): 25 mg via ORAL
  Filled 2018-07-05 (×4): qty 1

## 2018-07-05 MED ORDER — BUPIVACAINE LIPOSOME 1.3 % IJ SUSP
20.0000 mL | INTRAMUSCULAR | Status: AC
  Administered 2018-07-05: 20 mL
  Filled 2018-07-05: qty 20

## 2018-07-05 MED ORDER — SODIUM CHLORIDE 0.9 % IV SOLN
INTRAVENOUS | Status: DC | PRN
  Administered 2018-07-05: 30 ug/min via INTRAVENOUS

## 2018-07-05 MED ORDER — ROCURONIUM BROMIDE 10 MG/ML (PF) SYRINGE
PREFILLED_SYRINGE | INTRAVENOUS | Status: DC | PRN
  Administered 2018-07-05: 20 mg via INTRAVENOUS
  Administered 2018-07-05: 10 mg via INTRAVENOUS
  Administered 2018-07-05: 50 mg via INTRAVENOUS
  Administered 2018-07-05: 30 mg via INTRAVENOUS
  Administered 2018-07-05 (×3): 20 mg via INTRAVENOUS

## 2018-07-05 MED ORDER — PROBIOTIC PO CAPS: 1.0000 | ORAL_CAPSULE | Freq: Every day | ORAL | Status: DC

## 2018-07-05 MED ORDER — POTASSIUM CHLORIDE IN NACL 20-0.9 MEQ/L-% IV SOLN
INTRAVENOUS | Status: DC
  Administered 2018-07-05: 18:00:00 via INTRAVENOUS
  Filled 2018-07-05 (×3): qty 1000

## 2018-07-05 MED ORDER — ALPRAZOLAM 0.25 MG PO TABS
0.2500 mg | ORAL_TABLET | Freq: Two times a day (BID) | ORAL | Status: DC | PRN
  Administered 2018-07-05 – 2018-07-08 (×6): 0.25 mg via ORAL
  Filled 2018-07-05 (×6): qty 1

## 2018-07-05 MED ORDER — PROPOFOL 10 MG/ML IV BOLUS
INTRAVENOUS | Status: AC
  Filled 2018-07-05: qty 20

## 2018-07-05 MED ORDER — CEFAZOLIN SODIUM-DEXTROSE 2-4 GM/100ML-% IV SOLN
2.0000 g | Freq: Three times a day (TID) | INTRAVENOUS | Status: AC
  Administered 2018-07-05 – 2018-07-06 (×2): 2 g via INTRAVENOUS
  Filled 2018-07-05 (×2): qty 100

## 2018-07-05 MED ORDER — ESMOLOL HCL 100 MG/10ML IV SOLN
INTRAVENOUS | Status: AC
  Filled 2018-07-05: qty 10

## 2018-07-05 MED ORDER — LACTATED RINGERS IV SOLN
INTRAVENOUS | Status: DC | PRN
  Administered 2018-07-05: 08:00:00 via INTRAVENOUS

## 2018-07-05 MED ORDER — DIPHENHYDRAMINE HCL 12.5 MG/5ML PO ELIX
12.5000 mg | ORAL_SOLUTION | Freq: Four times a day (QID) | ORAL | Status: DC | PRN
  Filled 2018-07-05: qty 5

## 2018-07-05 MED ORDER — TAMSULOSIN HCL 0.4 MG PO CAPS
0.4000 mg | ORAL_CAPSULE | Freq: Every day | ORAL | Status: DC
  Administered 2018-07-05 – 2018-07-08 (×4): 0.4 mg via ORAL
  Filled 2018-07-05 (×4): qty 1

## 2018-07-05 MED ORDER — LIDOCAINE 2% (20 MG/ML) 5 ML SYRINGE
INTRAMUSCULAR | Status: DC | PRN
  Administered 2018-07-05: 40 mg via INTRAVENOUS
  Administered 2018-07-05: 60 mg via INTRAVENOUS

## 2018-07-05 MED ORDER — DIPHENHYDRAMINE HCL 12.5 MG/5ML PO ELIX: 12.5000 mg | ORAL_SOLUTION | Freq: Four times a day (QID) | ORAL | Status: DC | PRN

## 2018-07-05 MED ORDER — FENTANYL CITRATE (PF) 100 MCG/2ML IJ SOLN
INTRAMUSCULAR | Status: AC
  Filled 2018-07-05: qty 2

## 2018-07-05 MED ORDER — CEFAZOLIN SODIUM-DEXTROSE 2-4 GM/100ML-% IV SOLN
2.0000 g | INTRAVENOUS | Status: AC
  Administered 2018-07-05: 2 g via INTRAVENOUS
  Filled 2018-07-05: qty 100

## 2018-07-05 SURGICAL SUPPLY — 99 items
ADAPTER VALVE BIOPSY EBUS (MISCELLANEOUS) IMPLANT
ADPTR VALVE BIOPSY EBUS (MISCELLANEOUS)
APPLICATOR TIP COSEAL (VASCULAR PRODUCTS) IMPLANT
APPLICATOR TIP EXT COSEAL (VASCULAR PRODUCTS) IMPLANT
BRUSH CYTOL CELLEBRITY 1.5X140 (MISCELLANEOUS) IMPLANT
CANISTER SUCT 3000ML PPV (MISCELLANEOUS) ×3 IMPLANT
CATH THORACIC 28FR (CATHETERS) ×3 IMPLANT
CATH THORACIC 36FR (CATHETERS) IMPLANT
CATH THORACIC 36FR RT ANG (CATHETERS) IMPLANT
CLIP VESOCCLUDE MED 6/CT (CLIP) ×3 IMPLANT
CONN ST 1/4X3/8  BEN (MISCELLANEOUS) ×1
CONN ST 1/4X3/8 BEN (MISCELLANEOUS) ×2 IMPLANT
CONT SPEC 4OZ CLIKSEAL STRL BL (MISCELLANEOUS) ×15 IMPLANT
COVER BACK TABLE 60X90IN (DRAPES) ×3 IMPLANT
COVER WAND RF STERILE (DRAPES) IMPLANT
DERMABOND ADVANCED (GAUZE/BANDAGES/DRESSINGS) ×1
DERMABOND ADVANCED .7 DNX12 (GAUZE/BANDAGES/DRESSINGS) ×2 IMPLANT
DISSECTOR BLUNT TIP ENDO 5MM (MISCELLANEOUS) IMPLANT
DRAIN CHANNEL 28F RND 3/8 FF (WOUND CARE) ×3 IMPLANT
DRAIN CHANNEL 32F RND 10.7 FF (WOUND CARE) IMPLANT
DRAPE LAPAROSCOPIC ABDOMINAL (DRAPES) ×3 IMPLANT
DRILL BIT 7/64X5 (BIT) IMPLANT
ELECT BLADE 4.0 EZ CLEAN MEGAD (MISCELLANEOUS) ×3
ELECT BLADE 6.5 EXT (BLADE) ×6 IMPLANT
ELECT REM PT RETURN 9FT ADLT (ELECTROSURGICAL) ×3
ELECTRODE BLDE 4.0 EZ CLN MEGD (MISCELLANEOUS) ×2 IMPLANT
ELECTRODE REM PT RTRN 9FT ADLT (ELECTROSURGICAL) ×2 IMPLANT
FORCEPS BIOP RJ4 1.8 (CUTTING FORCEPS) IMPLANT
GAUZE SPONGE 4X4 12PLY STRL (GAUZE/BANDAGES/DRESSINGS) ×3 IMPLANT
GLOVE BIO SURGEON STRL SZ 6.5 (GLOVE) ×6 IMPLANT
GOWN STRL REUS W/ TWL LRG LVL3 (GOWN DISPOSABLE) ×8 IMPLANT
GOWN STRL REUS W/TWL LRG LVL3 (GOWN DISPOSABLE) ×4
KIT BASIN OR (CUSTOM PROCEDURE TRAY) ×3 IMPLANT
KIT CLEAN ENDO COMPLIANCE (KITS) IMPLANT
KIT SUCTION CATH 14FR (SUCTIONS) ×3 IMPLANT
KIT TURNOVER KIT B (KITS) ×3 IMPLANT
MARKER SKIN DUAL TIP RULER LAB (MISCELLANEOUS) IMPLANT
NEEDLE 22X1 1/2 (OR ONLY) (NEEDLE) ×3 IMPLANT
NEEDLE SPNL 18GX3.5 QUINCKE PK (NEEDLE) ×6 IMPLANT
NS IRRIG 1000ML POUR BTL (IV SOLUTION) ×6 IMPLANT
OIL SILICONE PENTAX (PARTS (SERVICE/REPAIRS)) IMPLANT
PACK CHEST (CUSTOM PROCEDURE TRAY) ×3 IMPLANT
PAD ARMBOARD 7.5X6 YLW CONV (MISCELLANEOUS) ×9 IMPLANT
PASSER SUT SWANSON 36MM LOOP (INSTRUMENTS) ×3 IMPLANT
POUCH ENDO CATCH II 15MM (MISCELLANEOUS) ×3 IMPLANT
SCISSORS LAP 5X35 DISP (ENDOMECHANICALS) IMPLANT
SEALANT PROGEL (MISCELLANEOUS) ×3 IMPLANT
SEALANT SURG COSEAL 4ML (VASCULAR PRODUCTS) IMPLANT
SEALANT SURG COSEAL 8ML (VASCULAR PRODUCTS) IMPLANT
SOLUTION ANTI FOG 6CC (MISCELLANEOUS) ×6 IMPLANT
SPONGE TONSIL TAPE 1 RFD (DISPOSABLE) IMPLANT
STAPLE RELOAD 2.5MM WHITE (STAPLE) ×3 IMPLANT
STAPLE RELOAD 45 GRN (STAPLE) ×2 IMPLANT
STAPLE RELOAD 45MM GOLD (STAPLE) ×15 IMPLANT
STAPLE RELOAD 45MM GREEN (STAPLE) ×1
STAPLER ECHELON POWERED (MISCELLANEOUS) ×3 IMPLANT
STAPLER VASCULAR ECHELON 35 (CUTTER) ×3 IMPLANT
STOPCOCK 4 WAY LG BORE MALE ST (IV SETS) ×3 IMPLANT
SUT PROLENE 3 0 SH DA (SUTURE) IMPLANT
SUT PROLENE 4 0 RB 1 (SUTURE)
SUT PROLENE 4-0 RB1 .5 CRCL 36 (SUTURE) IMPLANT
SUT SILK  1 MH (SUTURE) ×4
SUT SILK 1 MH (SUTURE) ×8 IMPLANT
SUT SILK 1 TIES 10X30 (SUTURE) IMPLANT
SUT SILK 2 0 SH (SUTURE) IMPLANT
SUT SILK 2 0SH CR/8 30 (SUTURE) IMPLANT
SUT STEEL 1 (SUTURE) IMPLANT
SUT VIC AB 0 CTX 18 (SUTURE) ×3 IMPLANT
SUT VIC AB 1 CTX 18 (SUTURE) ×3 IMPLANT
SUT VIC AB 1 CTX 36 (SUTURE)
SUT VIC AB 1 CTX36XBRD ANBCTR (SUTURE) IMPLANT
SUT VIC AB 2-0 CTX 36 (SUTURE) ×3 IMPLANT
SUT VIC AB 3-0 SH 27 (SUTURE) ×2
SUT VIC AB 3-0 SH 27X BRD (SUTURE) ×4 IMPLANT
SUT VIC AB 3-0 SH 8-18 (SUTURE) IMPLANT
SUT VIC AB 3-0 X1 27 (SUTURE) ×3 IMPLANT
SUT VICRYL 0 UR6 27IN ABS (SUTURE) IMPLANT
SUT VICRYL 2 TP 1 (SUTURE) ×3 IMPLANT
SYR 10ML LL (SYRINGE) ×3 IMPLANT
SYR 20ML ECCENTRIC (SYRINGE) ×3 IMPLANT
SYR 30ML LL (SYRINGE) ×3 IMPLANT
SYR 5ML LL (SYRINGE) ×3 IMPLANT
SYRINGE 60CC LL (MISCELLANEOUS) ×3 IMPLANT
SYSTEM SAHARA CHEST DRAIN ATS (WOUND CARE) ×3 IMPLANT
TAPE CLOTH SURG 4X10 WHT LF (GAUZE/BANDAGES/DRESSINGS) ×3 IMPLANT
TAPE UMBILICAL COTTON 1/8X30 (MISCELLANEOUS) ×3 IMPLANT
TIP APPLICATOR SPRAY EXTEND 16 (VASCULAR PRODUCTS) ×3 IMPLANT
TOWEL GREEN STERILE (TOWEL DISPOSABLE) ×3 IMPLANT
TOWEL GREEN STERILE FF (TOWEL DISPOSABLE) ×3 IMPLANT
TRAP SPECIMEN MUCOUS 40CC (MISCELLANEOUS) IMPLANT
TRAY FOLEY MTR SLVR 16FR STAT (SET/KITS/TRAYS/PACK) ×3 IMPLANT
TROCAR BLADELESS 12MM (ENDOMECHANICALS) IMPLANT
TROCAR XCEL 12X100 BLDLESS (ENDOMECHANICALS) IMPLANT
TUBE CONNECTING 20X1/4 (TUBING) ×3 IMPLANT
TUBING EXTENTION W/L.L. (IV SETS) ×3 IMPLANT
VALVE BIOPSY  SINGLE USE (MISCELLANEOUS)
VALVE BIOPSY SINGLE USE (MISCELLANEOUS) IMPLANT
VALVE SUCTION BRONCHIO DISP (MISCELLANEOUS) IMPLANT
WATER STERILE IRR 1000ML POUR (IV SOLUTION) ×3 IMPLANT

## 2018-07-05 NOTE — Transfer of Care (Signed)
Immediate Anesthesia Transfer of Care Note  Patient: Johnny Peck  Procedure(s) Performed: VIDEO ASSISTED THORACOSCOPY (VATS)/LUNG RESECTION/ Parital Left Lower Lobectomy/ Lymph Node Dissection/ Intercostal Nerve Block (Left Chest)  Patient Location: PACU  Anesthesia Type:General  Level of Consciousness: awake, alert  and oriented  Airway & Oxygen Therapy: Patient Spontanous Breathing and Patient connected to face mask oxygen  Post-op Assessment: Report given to RN and Post -op Vital signs reviewed and stable  Post vital signs: Reviewed and stable  Last Vitals:  Vitals Value Taken Time  BP 114/67 07/05/2018 12:48 PM  Temp    Pulse 71 07/05/2018 12:52 PM  Resp 24 07/05/2018 12:52 PM  SpO2 100 % 07/05/2018 12:52 PM  Vitals shown include unvalidated device data.  Last Pain:  Vitals:   07/05/18 0658  TempSrc: Oral  PainSc: 0-No pain         Complications: No apparent anesthesia complications

## 2018-07-05 NOTE — Anesthesia Postprocedure Evaluation (Signed)
Anesthesia Post Note  Patient: Johnny Peck  Procedure(s) Performed: VIDEO ASSISTED THORACOSCOPY (VATS)/LUNG RESECTION/ Parital Left Lower Lobectomy/ Lymph Node Dissection/ Intercostal Nerve Block (Left Chest)     Patient location during evaluation: PACU Anesthesia Type: General Level of consciousness: awake and alert Pain management: satisfactory to patient Vital Signs Assessment: post-procedure vital signs reviewed and stable Respiratory status: spontaneous breathing, nonlabored ventilation, respiratory function stable and patient connected to nasal cannula oxygen Cardiovascular status: blood pressure returned to baseline and stable Postop Assessment: no apparent nausea or vomiting Anesthetic complications: no    Last Vitals:  Vitals:   07/05/18 1445 07/05/18 1448  BP:  125/74  Pulse: 77 79  Resp: (!) 28 (!) 28  Temp: (!) 36.1 C   SpO2: 95% 96%    Last Pain:  Vitals:   07/05/18 1434  TempSrc:   PainSc: 4                  Adaline Trejos COKER

## 2018-07-05 NOTE — Brief Op Note (Addendum)
      [address]       [place] [address]             [phone]     07/05/2018  12:23 PM  PATIENT:  Sulayman Manning Hackenberg  73 y.o. male  PRE-OPERATIVE DIAGNOSIS:  Left lower lung nodule  POST-OPERATIVE DIAGNOSIS:  Left lower lung nodule-granulomatous disease  PROCEDURE:  LEFT VIDEO ASSISTED THORACOSCOPY (VATS), Parital Left Lower Lobectomy, Lymph Node Dissection, and Intercostal Nerve Block   SURGEON:  Surgeon(s) and Role:     [name], MD - Primary  PHYSICIAN ASSISTANT: Haynes Hoehn PA-C  ANESTHESIA:   general  EBL:  100 mL  .logo BLOOD ADMINISTERED:none  DRAINS: 85 French chest tube and 28 Blake drain in the left pleural space  LOCAL MEDICATIONS USED: Exparel  SPECIMEN:  Source of Specimen:  Partial left lower lobectomy, multiple lymph nodes  DISPOSITION OF SPECIMEN:  Pathology. Frozen section showed granulomatous disease.  Visceral pleural mass was benign.  COUNTS CORRECT:  YES  DICTATION: .Dragon Dictation  PLAN OF CARE: Admit to inpatient   PATIENT DISPOSITION:  PACU - hemodynamically stable.   Delay start of Pharmacological VTE agent (>24hrs) due to surgical blood loss or risk of bleeding: yes
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
## 2018-07-05 NOTE — Anesthesia Preprocedure Evaluation (Signed)
Anesthesia Evaluation  Patient identified by MRN, date of birth, ID band Patient awake    Reviewed: Allergy & Precautions, NPO status , Patient's Chart, lab work & pertinent test results  Airway Mallampati: II  TM Distance: >3 FB Neck ROM: Full    Dental  (+) Teeth Intact, Dental Advisory Given   Pulmonary former smoker,    breath sounds clear to auscultation       Cardiovascular hypertension,  Rhythm:Regular Rate:Normal     Neuro/Psych    GI/Hepatic   Endo/Other    Renal/GU      Musculoskeletal   Abdominal   Peds  Hematology   Anesthesia Other Findings   Reproductive/Obstetrics                             Anesthesia Physical Anesthesia Plan  ASA: III  Anesthesia Plan: General   Post-op Pain Management:    Induction: Intravenous  PONV Risk Score and Plan: Ondansetron and Dexamethasone  Airway Management Planned: Double Lumen EBT  Additional Equipment: Arterial line  Intra-op Plan:   Post-operative Plan: Extubation in OR  Informed Consent: I have reviewed the patients History and Physical, chart, labs and discussed the procedure including the risks, benefits and alternatives for the proposed anesthesia with the patient or authorized representative who has indicated his/her understanding and acceptance.     Dental advisory given  Plan Discussed with: CRNA and Anesthesiologist  Anesthesia Plan Comments:         Anesthesia Quick Evaluation

## 2018-07-05 NOTE — Anesthesia Procedure Notes (Signed)
Arterial Line Insertion Start/End3/18/2020 8:00 AM, 07/05/2018 8:15 AM Performed by: Roberts Gaudy, MD, Milford Cage, CRNA, CRNA  Patient location: Pre-op. Preanesthetic checklist: patient identified, IV checked, site marked, risks and benefits discussed, surgical consent, monitors and equipment checked, pre-op evaluation, timeout performed and anesthesia consent Lidocaine 1% used for infiltration Left, radial was placed Catheter size: 20 G Hand hygiene performed , maximum sterile barriers used  and Seldinger technique used  Attempts: 2 Procedure performed without using ultrasound guided technique. Following insertion, dressing applied. Post procedure assessment: normal and unchanged  Patient tolerated the procedure well with no immediate complications.

## 2018-07-05 NOTE — Anesthesia Procedure Notes (Signed)
Procedure Name: Intubation Performed by: Milford Cage, CRNA Pre-anesthesia Checklist: Patient identified, Emergency Drugs available, Suction available and Patient being monitored Patient Re-evaluated:Patient Re-evaluated prior to induction Oxygen Delivery Method: Circle System Utilized Preoxygenation: Pre-oxygenation with 100% oxygen Induction Type: IV induction Ventilation: Mask ventilation without difficulty Laryngoscope Size: Mac and 4 Grade View: Grade I Endobronchial tube: Left and Double lumen EBT and 39 Fr Number of attempts: 1 Airway Equipment and Method: Stylet and Oral airway Placement Confirmation: ETT inserted through vocal cords under direct vision,  positive ETCO2 and breath sounds checked- equal and bilateral Tube secured with: Tape Dental Injury: Teeth and Oropharynx as per pre-operative assessment

## 2018-07-06 ENCOUNTER — Inpatient Hospital Stay (HOSPITAL_COMMUNITY): Payer: Medicare Other

## 2018-07-06 ENCOUNTER — Encounter (HOSPITAL_COMMUNITY): Payer: Self-pay | Admitting: Cardiothoracic Surgery

## 2018-07-06 DIAGNOSIS — Z902 Acquired absence of lung [part of]: Secondary | ICD-10-CM

## 2018-07-06 LAB — BLOOD GAS, ARTERIAL
Acid-Base Excess: 1.3 mmol/L (ref 0.0–2.0)
Bicarbonate: 25.8 mmol/L (ref 20.0–28.0)
O2 Content: 2 L/min
O2 Saturation: 95.4 %
Patient temperature: 98.6
pCO2 arterial: 44.8 mmHg (ref 32.0–48.0)
pH, Arterial: 7.379 (ref 7.350–7.450)
pO2, Arterial: 79.6 mmHg — ABNORMAL LOW (ref 83.0–108.0)

## 2018-07-06 LAB — BASIC METABOLIC PANEL
Anion gap: 6 (ref 5–15)
BUN: 23 mg/dL (ref 8–23)
CO2: 23 mmol/L (ref 22–32)
Calcium: 8.3 mg/dL — ABNORMAL LOW (ref 8.9–10.3)
Chloride: 107 mmol/L (ref 98–111)
Creatinine, Ser: 0.78 mg/dL (ref 0.61–1.24)
GFR calc Af Amer: >60 mL/min (ref >60)
Glucose, Bld: 139 mg/dL — ABNORMAL HIGH (ref 70–99)
Potassium: 4 mmol/L (ref 3.5–5.1)
Sodium: 136 mmol/L (ref 135–145)

## 2018-07-06 LAB — CBC
HCT: 37.3 % — ABNORMAL LOW (ref 39.0–52.0)
Hemoglobin: 12.1 g/dL — ABNORMAL LOW (ref 13.0–17.0)
MCH: 30.3 pg (ref 26.0–34.0)
MCHC: 32.4 g/dL (ref 30.0–36.0)
MCV: 93.5 fL (ref 80.0–100.0)
Platelets: 142 10*3/uL — ABNORMAL LOW (ref 150–400)
RBC: 3.99 MIL/uL — ABNORMAL LOW (ref 4.22–5.81)
RDW: 12.6 % (ref 11.5–15.5)
WBC: 9.9 10*3/uL (ref 4.0–10.5)
nRBC: 0 % (ref 0.0–0.2)

## 2018-07-06 LAB — GLUCOSE, CAPILLARY
Glucose-Capillary: 117 mg/dL — ABNORMAL HIGH (ref 70–99)
Glucose-Capillary: 114 mg/dL — ABNORMAL HIGH (ref 70–99)
Glucose-Capillary: 146 mg/dL — ABNORMAL HIGH (ref 70–99)

## 2018-07-06 LAB — ACID FAST SMEAR (AFB, MYCOBACTERIA): Acid Fast Smear: NEGATIVE

## 2018-07-06 MED ORDER — KETOROLAC TROMETHAMINE 15 MG/ML IJ SOLN
15.0000 mg | Freq: Four times a day (QID) | INTRAMUSCULAR | Status: DC | PRN
  Administered 2018-07-06 – 2018-07-08 (×7): 15 mg via INTRAVENOUS
  Filled 2018-07-06 (×7): qty 1
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
## 2018-07-06 NOTE — Progress Notes (Signed)
Patient ID: Johnny Peck, male   DOB: [birth year], 73 y.o.   MRN: [id number] TCTS DAILY ICU PROGRESS NOTE                   [address]            [place] [address]          [phone]   1 Day Post-Op Procedure(s) (LRB): VIDEO ASSISTED THORACOSCOPY (VATS)/LUNG RESECTION/ Parital Left Lower Lobectomy/ Lymph Node Dissection/ Intercostal Nerve Block (Left)  Total Length of Stay:  LOS: 1 day   Subjective: Some chest discomfort and anxiety last night    Objective: Vital signs in last 24 hours: Temp:  [94.4 F (34.7 C)-98.9 F (37.2 C)] 94.4 F (34.7 C) (03/19 0802) Pulse Rate:  [65-81] 67 (03/19 0802) Cardiac Rhythm: Normal sinus rhythm;Heart block (03/19 0700) Resp:  [12-33] 23 (03/19 0802) BP: (110-150)/(67-94) 138/84 (03/19 0802) SpO2:  [93 %-100 %] 100 % (03/19 0802) Arterial Line BP: (100-144)/(56-88) 138/88 (03/18 2308)  Filed Weights   07/05/18 0658  Weight: 85.3 kg    Weight change:    Hemodynamic parameters for last 24 hours:    Intake/Output from previous day: 03/18 0701 - 03/19 0700 In: 2693.9 [I.V.:2493.9; IV Piggyback:200] Out: 2060 [Urine:1650; Blood:100; Chest Tube:310]  Intake/Output this shift: No intake/output data recorded.  Current Meds: Scheduled Meds: . acetaminophen  1,000 mg Oral Q6H   Or  . acetaminophen (TYLENOL) oral liquid 160 mg/5 mL  1,000 mg Oral Q6H  . acidophilus  1 capsule Oral Daily  . aspirin EC  81 mg Oral QHS  . bisacodyl  10 mg Oral Daily  . diphenhydrAMINE  50 mg Oral QHS  . enoxaparin (LOVENOX) injection  40 mg Subcutaneous Q24H  . fentaNYL   Intravenous Q4H  . insulin aspart  0-24 Units Subcutaneous Q6H  . metoprolol succinate  25 mg Oral q morning - 10a  . mirabegron ER  50 mg Oral Daily  . multivitamin with minerals  5 tablet Oral Daily  . pantoprazole  40 mg Oral Daily  . rosuvastatin  40 mg Oral QHS  . senna-docusate  1 tablet Oral QHS  . tamsulosin  0.4 mg Oral QHS   Continuous  Infusions: . 0.9 % NaCl with KCl 20 mEq / L 100 mL/hr at 07/06/18 0655  . potassium chloride     PRN Meds:.ALPRAZolam, diphenhydrAMINE **OR** diphenhydrAMINE, naloxone **AND** sodium chloride flush, ondansetron (ZOFRAN) IV, ondansetron (ZOFRAN) IV, oxyCODONE, potassium chloride, traMADol  General appearance: alert and cooperative Neurologic: intact Heart: regular rate and rhythm, S1, S2 normal, no murmur, click, rub or gallop Lungs: diminished breath sounds bibasilar Abdomen: soft, non-tender; bowel sounds normal; no masses,  no organomegaly Extremities: extremities normal, atraumatic, no cyanosis or edema and Homans sign is negative, no sign of DVT Wound: no air leak  Lab Results: CBC: Recent Labs    07/03/18 1439 07/06/18 0236  WBC 7.4 9.9  HGB 13.3 12.1*  HCT 40.5 37.3*  PLT 167 142*   BMET:  Recent Labs    07/03/18 1439 07/06/18 0236  NA 138 136  K 4.1 4.0  CL 108 107  CO2 21* 23  GLUCOSE 106* 139*  BUN 19 23  CREATININE 0.91 0.78  CALCIUM 9.3 8.3*    CMET: Lab Results  Component Value Date   WBC 9.9 07/06/2018   HGB 12.1 (L) 07/06/2018   HCT 37.3 (L) 07/06/2018   PLT 142 (L) 07/06/2018   GLUCOSE  139 (H) 07/06/2018   ALT 44 07/03/2018   AST 32 07/03/2018   NA 136 07/06/2018   K 4.0 07/06/2018   CL 107 07/06/2018   CREATININE 0.78 07/06/2018   BUN 23 07/06/2018   CO2 23 07/06/2018   INR 1.1 07/03/2018      PT/INR:  Recent Labs    07/03/18 1439  LABPROT 14.2  INR 1.1   Radiology: Dg Chest Port 1 View  Result Date: 07/06/2018 CLINICAL DATA:  Chest tube present with pneumothorax. EXAM: PORTABLE CHEST 1 VIEW COMPARISON:  07/05/2018. FINDINGS: The heart is enlarged. LEFT chest tubes are redemonstrated and stable. No significant pneumothorax. Bibasilar atelectasis is unchanged. IMPRESSION: Stable chest. No significant pneumothorax. Electronically Signed   By: Staci Righter M.D.   On: 07/06/2018 07:25   Dg Chest Port 1 View  Result Date: 07/05/2018  CLINICAL DATA:  Chest tubes present.  Status post VATS. EXAM: PORTABLE CHEST 1 VIEW COMPARISON:  07/03/2018 FINDINGS: There are 2 left-sided chest tubes. Both tubes are near the left lung apex. No significant pneumothorax. Patchy linear densities at the lung bases are suggestive for atelectasis. Decreased lung volumes. Heart size is normal for low lung volumes. Trachea is midline. IMPRESSION: Left chest tubes without a pneumothorax. Bibasilar atelectasis. Electronically Signed   By: Markus Daft M.D.   On: 07/05/2018 13:22     Assessment/Plan: S/P Procedure(s) (LRB): VIDEO ASSISTED THORACOSCOPY (VATS)/LUNG RESECTION/ Parital Left Lower Lobectomy/ Lymph Node Dissection/ Intercostal Nerve Block (Left) Mobilize Diuresis d/c tubes/lines Expected Acute  Blood - loss Anemia- continue to monitor     Johnny Peck 07/06/2018 9:01 AM

## 2018-07-07 ENCOUNTER — Inpatient Hospital Stay (HOSPITAL_COMMUNITY): Payer: Medicare Other

## 2018-07-07 LAB — CBC
HCT: 38.1 % — ABNORMAL LOW (ref 39.0–52.0)
Hemoglobin: 12.6 g/dL — ABNORMAL LOW (ref 13.0–17.0)
MCH: 31.5 pg (ref 26.0–34.0)
MCHC: 33.1 g/dL (ref 30.0–36.0)
MCV: 95.3 fL (ref 80.0–100.0)
NRBC: 0 % (ref 0.0–0.2)
Platelets: 142 10*3/uL — ABNORMAL LOW (ref 150–400)
RBC: 4 MIL/uL — ABNORMAL LOW (ref 4.22–5.81)
RDW: 13.1 % (ref 11.5–15.5)
WBC: 10.9 10*3/uL — ABNORMAL HIGH (ref 4.0–10.5)

## 2018-07-07 LAB — COMPREHENSIVE METABOLIC PANEL
ALT: 33 U/L (ref 0–44)
AST: 39 U/L (ref 15–41)
Albumin: 3.1 g/dL — ABNORMAL LOW (ref 3.5–5.0)
Alkaline Phosphatase: 41 U/L (ref 38–126)
Anion gap: 8 (ref 5–15)
BUN: 23 mg/dL (ref 8–23)
CO2: 21 mmol/L — ABNORMAL LOW (ref 22–32)
Calcium: 8.8 mg/dL — ABNORMAL LOW (ref 8.9–10.3)
Chloride: 105 mmol/L (ref 98–111)
Creatinine, Ser: 0.93 mg/dL (ref 0.61–1.24)
GFR calc Af Amer: >60 mL/min (ref >60)
GFR calc non Af Amer: >60 mL/min (ref >60)
Glucose, Bld: 112 mg/dL — ABNORMAL HIGH (ref 70–99)
Potassium: 4.4 mmol/L (ref 3.5–5.1)
Sodium: 134 mmol/L — ABNORMAL LOW (ref 135–145)
Total Bilirubin: 0.7 mg/dL (ref 0.3–1.2)
Total Protein: 5.8 g/dL — ABNORMAL LOW (ref 6.5–8.1)

## 2018-07-07 LAB — GLUCOSE, CAPILLARY
Glucose-Capillary: 113 mg/dL — ABNORMAL HIGH (ref 70–99)
Glucose-Capillary: 138 mg/dL — ABNORMAL HIGH (ref 70–99)

## 2018-07-07 NOTE — Care Management Important Message (Signed)
Important Message  Patient Details  Name: Johnny Peck MRN: [id number] Date of Birth: June 10, 1945   Medicare Important Message Given:  Yes    Orbie Pyo 07/07/2018, 3:13 PM

## 2018-07-07 NOTE — Progress Notes (Addendum)
[address]       [place] [address]             [phone]      2 Days Post-Op Procedure(s) (LRB): VIDEO ASSISTED THORACOSCOPY (VATS)/LUNG RESECTION/ Parital Left Lower Lobectomy/ Lymph Node Dissection/ Intercostal Nerve Block (Left)   Subjective:  No new complaints.  Continues to have pain.  Objective: Vital signs in last 24 hours: Temp:  [97.6 F (36.4 C)-98.8 F (37.1 C)] 98.3 F (36.8 C) (03/20 0737) Pulse Rate:  [58-71] 71 (03/20 0737) Cardiac Rhythm: Heart block (03/20 0710) Resp:  [18-29] 27 (03/20 0737) BP: (132-168)/(72-88) 149/73 (03/20 0737) SpO2:  [92 %-100 %] 92 % (03/20 0737)  Intake/Output from previous day: 03/19 0701 - 03/20 0700 In: 790 [P.O.:360; I.V.:430] Out: 1800 [Urine:1300; Chest Tube:500]  General appearance: alert, cooperative and no distress Heart: regular rate and rhythm Lungs: clear to auscultation bilaterally Abdomen: soft, non-tender; bowel sounds normal; no masses,  no organomegaly Wound: clean and dry, + irritation due to tape  Lab Results: Recent Labs    07/06/18 0236 07/07/18 0240  WBC 9.9 10.9*  HGB 12.1* 12.6*  HCT 37.3* 38.1*  PLT 142* 142*   BMET:  Recent Labs    07/06/18 0236 07/07/18 0240  NA 136 134*  K 4.0 4.4  CL 107 105  CO2 23 21*  GLUCOSE 139* 112*  BUN 23 23  CREATININE 0.78 0.93  CALCIUM 8.3* 8.8*    PT/INR: No results for input(s): LABPROT, INR in the last 72 hours. ABG    Component Value Date/Time   PHART 7.379 07/06/2018 0225   HCO3 25.8 07/06/2018 0225   O2SAT 95.4 07/06/2018 0225   CBG (last 3)  Recent Labs    07/06/18 1628 07/06/18 2351 07/07/18 0621  GLUCAP 146* 113* 138*    Assessment/Plan: S/P Procedure(s) (LRB): VIDEO ASSISTED THORACOSCOPY (VATS)/LUNG RESECTION/ Parital Left Lower Lobectomy/ Lymph Node Dissection/ Intercostal Nerve Block (Left)  1. Chest tube- anterior chest tube just removed, remaining CT without air leak, leave on water seal today  2. Pulm- tiny left apical pneumothorax, continue IS, not on oxygen 3. CV- hemodynamically stable 4. CBGs controlled, not a diabetic, will d/c SSIP 5. Dispo- patient stable, anterior chest tube just removed, no air leak in remaining chest tube, repeat CXR in AM   LOS: 2 days    Ellwood Handler 07/07/2018  Results for orders placed or performed during the hospital encounter of 07/05/18  Aerobic/Anaerobic Culture (surgical/deep wound)     Status: None (Preliminary result)   Collection Time: 07/05/18 11:31 AM  Result Value Ref Range Status   Specimen Description TISSUE PLEURAL  Final   Special Requests NONE  Final   Gram Stain   Final    RARE WBC PRESENT, PREDOMINANTLY MONONUCLEAR NO ORGANISMS SEEN    Culture   Final    NO GROWTH 2 DAYS NO ANAEROBES ISOLATED; CULTURE IN PROGRESS FOR 5 DAYS Performed at [facility], [address] [place], [place] [address]    Report Status PENDING  Incomplete  Acid Fast Smear (AFB)     Status: None   Collection Time: 07/05/18 11:31 AM  Result Value Ref Range Status   AFB Specimen Processing Comment  Final    Comment: Tissue Grinding and Digestion/Decontamination   Acid Fast Smear Negative  Final    Comment: (NOTE) Performed At: [facility] [address] [place], [place] [id number] Rush Farmer MD QP:[id number]  Source (AFB) TISSUE  Final    Comment: PLEURAL Performed at [facility], [place] [address], [place], [place] [address]     Final path pending One chest tube out today, likely remove remaining tube tomorrow I have seen and examined Claris Che Kizer and agree with the above assessment  and plan.  [name] MD Beeper [phone] Office [phone] 07/07/2018 2:46 PM

## 2018-07-07 NOTE — Progress Notes (Signed)
DC anterior chest tube per order. No complication noted. Will continue to monitor, posterior chest tube intact to sahara drainage system, water seal. Dressing changed.

## 2018-07-07 NOTE — Plan of Care (Signed)
  Problem: Elimination: Goal: Will not experience complications related to bowel motility Outcome: Adequate for Discharge Goal: Will not experience complications related to urinary retention Outcome: Not Applicable   Problem: Pain Managment: Goal: General experience of comfort will improve Outcome: Progressing   Problem: Skin Integrity: Goal: Risk for impaired skin integrity will decrease Outcome: Progressing

## 2018-07-07 NOTE — Discharge Instructions (Signed)
Video-Assisted Thoracic Surgery, Care After °This sheet gives you information about how to care for yourself after your procedure. Your health care provider may also give you more specific instructions. If you have problems or questions, contact your health care provider. °What can I expect after the procedure? °After the procedure, it is common to have: °· Some pain and soreness in your chest. °· Pain when breathing in (inhaling) and coughing. °· Constipation. °· Fatigue. °· Difficulty sleeping. °Follow these instructions at home: °Preventing pneumonia °· Take deep breaths or do breathing exercises as instructed by your health care provider. Doing this helps prevent lung infection (pneumonia). °· Cough frequently. Coughing may cause discomfort, but it is important to clear mucus (phlegm) and expand your lungs. If it hurts to cough, hold a pillow against your chest or place the palms of both hands on top of the incision (use splinting) when you cough. This may help relieve discomfort. °· If you were given an incentive spirometer, use it as directed. An incentive spirometer is a tool that measures how well you are filling your lungs with each breath. °· Participate in pulmonary rehabilitation as directed by your health care provider. This is a program that combines education, exercise, and support from a team of specialists. The goal is to help you heal and get back to your normal activities as soon as possible. °Medicines °· Take over-the-counter or prescription medicines only as told by your health care provider. °· If you have pain, take pain-relieving medicine before your pain becomes severe. This is important because if your pain is under control, you will be able to breathe and cough more comfortably. °· If you were prescribed an antibiotic medicine, take it as told by your health care provider. Do not stop taking the antibiotic even if you start to feel better. °Activity °· Ask your health care provider what  activities are safe for you. °· Avoid activities that use your chest muscles for at least 3-4 weeks. °· Do not lift anything that is heavier than 10 lb (4.5 kg), or the limit that your health care provider tells you, until he or she says that it is safe. °Incision care °· Follow instructions from your health care provider about how to take care of your incision(s). Make sure you: °? Wash your hands with soap and water before you change your bandage (dressing). If soap and water are not available, use hand sanitizer. °? Change your dressing as told by your health care provider. °? Leave stitches (sutures), skin glue, or adhesive strips in place. These skin closures may need to stay in place for 2 weeks or longer. If adhesive strip edges start to loosen and curl up, you may trim the loose edges. Do not remove adhesive strips completely unless your health care provider tells you to do that. °· Keep your dressing dry until it has been removed. °· Check your incision area every day for signs of infection. Check for: °? Redness, swelling, or pain. °? Fluid or blood. °? Warmth. °? Pus or a bad smell. °Bathing °· Do not take baths, swim, or use a hot tub until your health care provider approves. You may take showers. °· After your dressing has been removed, use soap and water to gently wash your incision area. Do not use anything else to clean your incision(s) unless your health care provider tells you to do this. °Driving ° °· Do not drive until your health care provider approves. °· Do not drive or   use heavy machinery while taking prescription pain medicine. °Eating and drinking °· Eat a healthy, balanced diet as instructed by your health care provider. A healthy diet includes plenty of fresh fruits and vegetables, whole grains, and low-fat (lean) proteins. °· Limit foods that are high in fat and processed sugars, such as fried and sweet foods. °· Drink enough fluid to keep your urine clear or pale yellow. °General  instructions ° °· To prevent or treat constipation while you are taking prescription pain medicine, your health care provider may recommend that you: °? Take over-the-counter or prescription medicines. °? Eat foods that are high in fiber, such as beans, fresh fruits and vegetables, and whole grains. °· Do not use any products that contain nicotine or tobacco, such as cigarettes and e-cigarettes. If you need help quitting, ask your health care provider. °· Avoid secondhand smoke. °· Wear compression stockings as told by your health care provider. These stockings help to prevent blood clots and reduce swelling in your legs. °· If you have a chest tube, care for it as instructed by your health care provider. Do not travel by airplane during the 2 weeks after your chest tube is removed, or until your health care provider says that this is safe. °· Keep all follow-up visits as told by your health care provider. This is important. °Contact a health care provider if: °· You have redness, swelling, or pain around an incision. °· You have fluid or blood coming from an incision. °· Your incision area feels warm to the touch. °· You have pus or a bad smell coming from an incision. °· You have a fever or chills. °· You have nausea or vomiting. °· You have pain that does not get better with medicine. °Get help right away if: °· You have chest pain. °· Your heart is fluttering or beating rapidly. °· You develop a rash. °· You have shortness of breath or trouble breathing. °· You are confused. °· You have trouble speaking. °· You feel weak, light-headed, or dizzy. °· You faint. °Summary °· To help prevent lung infection (pneumonia), take deep breaths or do breathing exercises as instructed by your health care provider. °· Cough frequently to clear mucus (phlegm) and expand your lungs. If it hurts to cough, hold a pillow against your chest or place the palms of both hands on top of the incision (use splinting) when you cough. °· If  you have pain, take pain-relieving medicine before your pain becomes severe. This is important because if your pain is under control, you will be able to breathe and cough more comfortably. °· Ask your health care provider what activities are safe for you. °This information is not intended to replace advice given to you by your health care provider. Make sure you discuss any questions you have with your health care provider. °Document Released: 07/31/2012 Document Revised: 03/15/2016 Document Reviewed: 03/15/2016 °Elsevier Interactive Patient Education © 2019 [employer] ° °

## 2018-07-08 ENCOUNTER — Inpatient Hospital Stay (HOSPITAL_COMMUNITY): Payer: Medicare Other

## 2018-07-08 MED ORDER — ADULT MULTIVITAMIN W/MINERALS CH
1.0000 | ORAL_TABLET | Freq: Every day | ORAL | Status: DC
  Administered 2018-07-08 – 2018-07-09 (×2): 1 via ORAL
  Filled 2018-07-08: qty 1

## 2018-07-08 MED ORDER — IRBESARTAN 150 MG PO TABS
150.0000 mg | ORAL_TABLET | Freq: Every day | ORAL | Status: DC
  Administered 2018-07-08 – 2018-07-09 (×2): 150 mg via ORAL
  Filled 2018-07-08 (×2): qty 1

## 2018-07-08 NOTE — Progress Notes (Addendum)
[address]       [place] [address]             [phone]      3 Days Post-Op Procedure(s) (LRB): VIDEO ASSISTED THORACOSCOPY (VATS)/LUNG RESECTION/ Parital Left Lower Lobectomy/ Lymph Node Dissection/ Intercostal Nerve Block (Left) Subjective: Feels ok, min pain, not SOB  Objective: Vital signs in last 24 hours: Temp:  [97.7 F (36.5 C)-99.4 F (37.4 C)] 98.3 F (36.8 C) (03/21 0732) Pulse Rate:  [63-70] 69 (03/21 0732) Cardiac Rhythm: Normal sinus rhythm (03/21 0758) Resp:  [17-26] 20 (03/21 0732) BP: (133-150)/(75-83) 137/76 (03/21 0732) SpO2:  [91 %-97 %] 94 % (03/21 0732)  Hemodynamic parameters for last 24 hours:    Intake/Output from previous day: 03/20 0701 - 03/21 0700 In: 393.8 [P.O.:240; I.V.:153.8] Out: 4944 [HQPRF:1638; Chest Tube:180] Intake/Output this shift: No intake/output data recorded.  General appearance: alert, cooperative and no distress Heart: regular rate and rhythm Lungs: coarse on left> right, improves with cough Abdomen: benign Extremities: no calf tenderness oe erema Wound: incis healing well *or edema  Lab Results: Recent Labs    07/06/18 0236 07/07/18 0240  WBC 9.9 10.9*  HGB 12.1* 12.6*  HCT 37.3* 38.1*  PLT 142* 142*   BMET:  Recent Labs    07/06/18 0236 07/07/18 0240  NA 136 134*  K 4.0 4.4  CL 107 105  CO2 23 21*  GLUCOSE 139* 112*  BUN 23 23  CREATININE 0.78 0.93  CALCIUM 8.3* 8.8*    PT/INR: No results for input(s): LABPROT, INR in the last 72 hours. ABG    Component Value Date/Time   PHART 7.379 07/06/2018 0225   HCO3 25.8 07/06/2018 0225   O2SAT 95.4 07/06/2018 0225   CBG (last 3)  Recent Labs    07/06/18 1628 07/06/18 2351 07/07/18 0621  GLUCAP 146* 113* 138*    Meds Scheduled Meds: . acetaminophen  1,000 mg Oral Q6H   Or  . acetaminophen (TYLENOL) oral liquid 160 mg/5 mL  1,000 mg Oral Q6H  . acidophilus  1 capsule Oral Daily  . aspirin EC  81 mg Oral QHS  .  bisacodyl  10 mg Oral Daily  . diphenhydrAMINE  50 mg Oral QHS  . enoxaparin (LOVENOX) injection  40 mg Subcutaneous Q24H  . metoprolol succinate  25 mg Oral q morning - 10a  . mirabegron ER  50 mg Oral Daily  . multivitamin with minerals  1 tablet Oral Daily  . pantoprazole  40 mg Oral Daily  . rosuvastatin  40 mg Oral QHS  . senna-docusate  1 tablet Oral QHS  . tamsulosin  0.4 mg Oral QHS   Continuous Infusions: . 0.9 % NaCl with KCl 20 mEq / L 10 mL/hr at 07/07/18 0300  . potassium chloride     PRN Meds:.ALPRAZolam, ketorolac, oxyCODONE, potassium chloride, traMADol  Xrays Dg Chest Port 1 View  Result Date: 07/08/2018 CLINICAL DATA:  Patient status post partial left lower lobectomy 07/05/2018. EXAM: PORTABLE CHEST 1 VIEW COMPARISON:  Single-view of the chest 07/07/2018 and 07/06/2018. FINDINGS: One of the patient's two left chest tubes has been removed. Tiny left apical pneumothorax is unchanged. Mild bibasilar subsegmental atelectasis is seen. IMPRESSION: Status post removal of 1 of 2 left chest tubes. Tiny left apical pneumothorax is unchanged. Bibasilar subsegmental atelectasis. Electronically Signed   By: Inge Rise M.D.   On: 07/08/2018 08:44   Dg Chest Port 1 View  Result Date:  07/07/2018 CLINICAL DATA:  Chest tube EXAM: PORTABLE CHEST 1 VIEW COMPARISON:  Yesterday FINDINGS: Trace left apical pneumothorax. Stable position of two left chest tubes. Streaky atelectasis. Cardiopericardial enlargement. IMPRESSION: Trace left apical pneumothorax. Electronically Signed   By: Monte Fantasia M.D.   On: 07/07/2018 06:55   Chest tube- no air leak, 400 out yesterday but only 30 since MN- d/c tube   Assessment/Plan: S/P Procedure(s) (LRB): VIDEO ASSISTED THORACOSCOPY (VATS)/LUNG RESECTION/ Parital Left Lower Lobectomy/ Lymph Node Dissection/ Intercostal Nerve Block (Left)  1 doing well 2 some HTN- resume ARB 3 sats good on 1 liter, wean, routine pulm toilet, add flutter valve 4  sinus rhythm 5 routine rehab 6 prob d/c in am  LOS: 3 days    Johnny Peck [facility] 07/08/2018 Pager [phone]  patient examined and medical record reviewed,agree with above note. Tharon Aquas Trigt III 07/08/2018

## 2018-07-08 NOTE — Plan of Care (Signed)
Patient is progressing toward all care goals.  Chest tube drainage has been decreasing; no air leak noted today;  and CT may be pulled out today - await cardiothoracic rounding.  Pt ambulates several times daily in the hallways and tolerates well.  Will continue to monitor and progress toward discharge.

## 2018-07-08 NOTE — Progress Notes (Signed)
CT removed from left lateral chest wall per order.  Patient pre-medicated w/Oxycodone per PRN orders and patient request.  Patient education provided.  Site covered w/gauze and secured w/paper tape.  CXR to be ordered.

## 2018-07-08 NOTE — Op Note (Signed)
NAME: Johnny Peck, DIMICHE[place] MEDICAL RECORD FT:[id number] ACCOUNT [id number] DATE OF BIRTH:1945-09-25 FACILITY: [facility] LOCATION: [place], [place]  OPERATIVE REPORT  DATE OF PROCEDURE:  07/05/2018  PREOPERATIVE DIAGNOSIS:  Left lower lobe hypermetabolic lung mass.  POSTOPERATIVE DIAGNOSIS:  Left lower lobe hypermetabolic lung mass.  Benign mass by frozen section.  PROCEDURES PERFORMED:   1.  Left video-assisted thoracoscopy. 2.  Mini thoracotomy. 3.  Partial left lower lobe lobectomy. 4.  Lymph node dissection with intercostal nerve block.  SURGEON:  Lanelle Bal, MD  FIRST ASSISTANT:  Lars Pinks, PA.  BRIEF HISTORY:  The patient is a 73 year old male with no previous history of smoking, who presented 6 months previously with inguinal hernia.  This was repaired by general surgery but ultimately became infected.  During the evaluation of his abdominal  pain and infection of his inguinal hernia site, a CT scan of the abdomen was performed.  This showed a left lower lobe lung nodule approximately 1 cm in size.  This was initially observed as he was recovering from his inguinal hernia surgery.  Followup  CT scan was then performed which showed a slight increase in size, but increasing density of the mass.  A PET scan was performed which showed the mass to be hypermetabolic and highly suspicious for a primary malignancy.  The patient was referred to  thoracic surgery.    This mass was fairly centrally located in the lower portion of the left lower lobe.  We discussed with the patient primary resection versus attempts at biopsy in first.  Because of the suspicious nature of its increasing density over time, slight  increase in size and hypermetabolic activity, the patient was counseled to proceed with resection for definitive diagnosis and treatment.  The patient had had no particular unusual environmental exposures in the past.  Risks and options had been   discussed thoroughly with him, and he was agreeable.  We proceeded with surgical resection.  DESCRIPTION OF PROCEDURE:  The patient underwent general endotracheal anesthesia with a double lumen endotracheal tube without difficulty.  He was then turned in lateral decubitus position with the left side up.  The left side had been previously marked.   The left chest was prepped with Betadine, draped in a sterile manner.  Appropriate timeout was then performed, confirming the patient's identity, radiographic findings and laterality.  The left lung was deflated.  The 5 mm port incision was made  posterior axillary line, approximately 6th intercostal space.  This gave good exposure.  With the 5 mm 30-degree scope, we carefully inspected the pleural surfaces without abnormality.  The fissure was fairly complete.  We then, slowly, above initial  port site made a small thoracotomy incision and with the scope and manipulation through our incision, were able to palpate the lower lobe.  As noted on CT scan, the mass was centrally located in the lower segments of the left lower lobe.  Initially, we  tried to localize the mass and consider a wedge resection to obtain a tissue diagnosis, but quickly abandoned this due to its location and proceeded with resection of the lower segments.  We dissected along the fissure and identified the pulmonary artery  branches to the lower segments, preserving the arterial branches to the superior segment.  The inferior pulmonary vein was then dissected free and encircled.  After the takeoff of the superior segment arterial branch, the pulmonary artery branch was  isolated and divided with a vascular stapler.  In a similar  fashion, the inferior pulmonary vein was divided.  This gave [place] exposure to the bronchus to the lower segments.  We then placed a green load stapler across the bronchus, inflated the lung to  test it prior to firing.  The bronchus was divided at this point using  gold-load stapler.  The lung was divided and lower portion of the left lower lobe was placed in a specimen bag and brought out through the incision.  A 1 cm mass was easily palpated  within the mass and the specimen was sent to pathology.  While waiting for pathologic results, we  then also removed some enlarged 10L and 4L lymph nodes.  Each of the sites of nodal removal were labeled and submitted in separate specimen cups.  A  mixture of Epicaine and Exparel in saline was then placed in a syringe and injected in the intercostal space posteriorly, 4, 5, 6 and 7, to provide an intercostal block for the incisions and chest tube sites.  The remaining portion of the lower lobe was  tacked to the upper lobe to prevent any torsion.    Ultimately, we obtained a frozen section specimen from the pathologist as granuloma without evidence of malignancy.  We then placed 2 chest tubes, 1 standard 28 Argyle and 1 Blake drain posteriorly through the port sites.  The lung was reinflated easily  without evidence of air leak.  Incision was closed with the deep muscle layer with interrupted 0 Vicryl and 3-0 Vicryl in subcutaneous tissue, 2-0 subcuticular stitch in skin edges.  Dry dressings were applied.  Sponge and needle count was reported as  correct.  The lung inflated well without air leak at the completion of the case.  RF scanning showed negative reported clear code.    The patient was awakened and extubated in the operating room, transferred to the recovery room for further postoperative care.    The estimated blood loss was less than 100 mL.  AN/NUANCE  D:07/08/2018 T:07/08/2018 JOB:[id number]

## 2018-07-09 ENCOUNTER — Inpatient Hospital Stay (HOSPITAL_COMMUNITY): Payer: Medicare Other

## 2018-07-09 MED ORDER — TRAMADOL HCL 50 MG PO TABS: 50.0000 mg | ORAL_TABLET | Freq: Four times a day (QID) | ORAL | 0 refills | Status: AC | PRN

## 2018-07-09 MED ORDER — ADULT MULTIVITAMIN W/MINERALS CH: 5.0000 | ORAL_TABLET | Freq: Every day | ORAL | Status: DC

## 2018-07-09 MED ORDER — COQ10 200 MG PO CAPS: 200.0000 mg | ORAL_CAPSULE | Freq: Every morning | ORAL | Status: AC

## 2018-07-09 NOTE — Progress Notes (Signed)
[address]       [place] [address]             [phone]      4 Days Post-Op Procedure(s) (LRB): VIDEO ASSISTED THORACOSCOPY (VATS)/LUNG RESECTION/ Parital Left Lower Lobectomy/ Lymph Node Dissection/ Intercostal Nerve Block (Left) Subjective: Feels well  Objective: Vital signs in last 24 hours: Temp:  [98.2 F (36.8 C)-99.4 F (37.4 C)] 98.9 F (37.2 C) (03/22 0447) Pulse Rate:  [64-73] 69 (03/22 0500) Cardiac Rhythm: Normal sinus rhythm (03/22 0700) Resp:  [12-22] 16 (03/22 0500) BP: (139-154)/(71-82) 143/77 (03/22 0447) SpO2:  [91 %-100 %] 91 % (03/22 0500)  Hemodynamic parameters for last 24 hours:    Intake/Output from previous day: 03/21 0701 - 03/22 0700 In: 325 [P.O.:325] Out: 400 [Urine:400] Intake/Output this shift: No intake/output data recorded.  General appearance: alert, cooperative and no distress Heart: regular rate and rhythm Lungs: clear to auscultation bilaterally Abdomen: benign Extremities: no edema or calf tenderness Wound: incis healing well  Lab Results: Recent Labs    07/07/18 0240  WBC 10.9*  HGB 12.6*  HCT 38.1*  PLT 142*   BMET:  Recent Labs    07/07/18 0240  NA 134*  K 4.4  CL 105  CO2 21*  GLUCOSE 112*  BUN 23  CREATININE 0.93  CALCIUM 8.8*    PT/INR: No results for input(s): LABPROT, INR in the last 72 hours. ABG    Component Value Date/Time   PHART 7.379 07/06/2018 0225   HCO3 25.8 07/06/2018 0225   O2SAT 95.4 07/06/2018 0225   CBG (last 3)  Recent Labs    07/06/18 1628 07/06/18 2351 07/07/18 0621  GLUCAP 146* 113* 138*    Meds Scheduled Meds: . acetaminophen  1,000 mg Oral Q6H   Or  . acetaminophen (TYLENOL) oral liquid 160 mg/5 mL  1,000 mg Oral Q6H  . acidophilus  1 capsule Oral Daily  . aspirin EC  81 mg Oral QHS  . bisacodyl  10 mg Oral Daily  . diphenhydrAMINE  50 mg Oral QHS  . enoxaparin (LOVENOX) injection  40 mg Subcutaneous Q24H  . irbesartan  150 mg Oral  Daily  . metoprolol succinate  25 mg Oral q morning - 10a  . mirabegron ER  50 mg Oral Daily  . multivitamin with minerals  1 tablet Oral Daily  . pantoprazole  40 mg Oral Daily  . rosuvastatin  40 mg Oral QHS  . senna-docusate  1 tablet Oral QHS  . tamsulosin  0.4 mg Oral QHS   Continuous Infusions: . 0.9 % NaCl with KCl 20 mEq / L 10 mL/hr at 07/07/18 0300  . potassium chloride     PRN Meds:.ALPRAZolam, ketorolac, oxyCODONE, potassium chloride, traMADol  Xrays Dg Chest 2 View  Result Date: 07/09/2018 CLINICAL DATA:  73 year old male with history of prior left lower lobe wedge resection. Follow-up evaluation. EXAM: CHEST - 2 VIEW COMPARISON:  Chest x-ray 07/08/2018. FINDINGS: Lung volumes remain low but have improved slightly. Postoperative changes of recent left lower lobe wedge resection are again noted with regional architectural distortion in this area. Probable small left pleural effusion. No pneumothorax. Some linear opacities are also noted in the right lower lobe, most compatible with mild subsegmental atelectasis. No definite acute consolidative airspace disease. No evidence of pulmonary edema. Heart size is normal. Upper mediastinal contours are within normal limits. IMPRESSION: 1. Expected postoperative findings, as above with some mild bibasilar subsegmental atelectasis and  probable small left pleural effusion. Lung volumes have increased slightly compared to yesterday's examination. Electronically Signed   By: Vinnie Langton M.D.   On: 07/09/2018 05:42   Dg Chest Port 1 View  Result Date: 07/08/2018 CLINICAL DATA:  Status post chest tube removal today. EXAM: PORTABLE CHEST 1 VIEW COMPARISON:  Single-view of the chest 07/07/2018 and 07/08/2018. FINDINGS: Left chest tube has been removed. Tiny left apical pneumothorax is unchanged. Atelectasis in the lung bases is again seen and has improved on the right. Heart size is normal. IMPRESSION: Status post removal of left chest tube.  Tiny left apical pneumothorax is unchanged. Bibasilar subsegmental atelectasis has improved on the right. Electronically Signed   By: Inge Rise M.D.   On: 07/08/2018 14:00   Dg Chest Port 1 View  Result Date: 07/08/2018 CLINICAL DATA:  Patient status post partial left lower lobectomy 07/05/2018. EXAM: PORTABLE CHEST 1 VIEW COMPARISON:  Single-view of the chest 07/07/2018 and 07/06/2018. FINDINGS: One of the patient's two left chest tubes has been removed. Tiny left apical pneumothorax is unchanged. Mild bibasilar subsegmental atelectasis is seen. IMPRESSION: Status post removal of 1 of 2 left chest tubes. Tiny left apical pneumothorax is unchanged. Bibasilar subsegmental atelectasis. Electronically Signed   By: Inge Rise M.D.   On: 07/08/2018 08:44    Assessment/Plan: S/P Procedure(s) (LRB): VIDEO ASSISTED THORACOSCOPY (VATS)/LUNG RESECTION/ Parital Left Lower Lobectomy/ Lymph Node Dissection/ Intercostal Nerve Block (Left)  1 conts to do well 2 CXR stable in appearance, sats good on RA 3 will need primary care f/u for HTN , cont same for now 4 stable for d/c   LOS: 4 days    John Giovanni [facility] 07/09/2018 Pager [phone]

## 2018-07-10 ENCOUNTER — Other Ambulatory Visit: Payer: Self-pay

## 2018-07-10 DIAGNOSIS — G8918 Other acute postprocedural pain: Secondary | ICD-10-CM

## 2018-07-10 LAB — AEROBIC/ANAEROBIC CULTURE W GRAM STAIN (SURGICAL/DEEP WOUND): Culture: NO GROWTH

## 2018-07-10 MED ORDER — HYDROCODONE-ACETAMINOPHEN 5-325 MG PO TABS: 1.0000 | ORAL_TABLET | ORAL | 0 refills | Status: DC | PRN

## 2018-07-10 NOTE — Progress Notes (Signed)
RX given for Hydrocodone 5/325 mg # 28

## 2018-07-19 ENCOUNTER — Other Ambulatory Visit: Payer: Self-pay | Admitting: Cardiothoracic Surgery

## 2018-07-19 DIAGNOSIS — R911 Solitary pulmonary nodule: Secondary | ICD-10-CM

## 2018-07-20 ENCOUNTER — Telehealth: Payer: Self-pay | Admitting: Cardiothoracic Surgery

## 2018-07-20 ENCOUNTER — Ambulatory Visit: Payer: Medicare Other | Admitting: Cardiothoracic Surgery

## 2018-07-20 ENCOUNTER — Encounter: Payer: Self-pay | Admitting: Cardiothoracic Surgery

## 2018-07-20 ENCOUNTER — Ambulatory Visit
Admission: RE | Admit: 2018-07-20 | Discharge: 2018-07-20 | Disposition: A | Discharge status: Home / Self Care | Payer: Medicare Other | Source: Ambulatory Visit | Attending: Cardiothoracic Surgery | Admitting: Cardiothoracic Surgery

## 2018-07-20 DIAGNOSIS — Z87448 Personal history of other diseases of urinary system: Secondary | ICD-10-CM | POA: Diagnosis not present

## 2018-07-20 DIAGNOSIS — Z9852 Vasectomy status: Secondary | ICD-10-CM | POA: Diagnosis not present

## 2018-07-20 DIAGNOSIS — R911 Solitary pulmonary nodule: Secondary | ICD-10-CM

## 2018-07-20 DIAGNOSIS — R399 Unspecified symptoms and signs involving the genitourinary system: Secondary | ICD-10-CM | POA: Diagnosis not present

## 2018-07-20 NOTE — Telephone Encounter (Signed)
[address]       [place] [address]             [phone]      Johnny Peck [place] Medical Record #[id number] Date of Birth: 1946/02/22  Referring: No ref. provider found Primary Care: Kathyrn Lass, MD Primary Cardiologist: No primary care provider on file.   Chief Complaint:   POST OP FOLLOW UP  07/05/2018  PREOPERATIVE DIAGNOSIS:  Left lower lobe hypermetabolic lung mass. POSTOPERATIVE DIAGNOSIS:  Left lower lobe hypermetabolic lung mass.  Benign mass by frozen section. PROCEDURES PERFORMED:   1.  Left video-assisted thoracoscopy. 2.  Mini thoracotomy. 3.  Partial left lower lobe lobectomy. 4.  Lymph node dissection with intercostal nerve block. SURGEON:  Lanelle Bal, MD  History of Present Illness:   Patient was called on the phone and chest x-ray done today were reviewed.  He is making good progress at home.  Has no fever chills notes a slight nonproductive cough.       Past Medical History:  Diagnosis Date  . Arthritis   . Detached retina    od  . GERD (gastroesophageal reflux disease)   . Hyperlipidemia   . Hypertension   . Lung nodule   . Urinary incontinence      Social History   Tobacco Use  Smoking Status Former Smoker  . Last attempt to quit: 12/25/1983  . Years since quitting: 34.5  Smokeless Tobacco Never Used    Social History   Substance and Sexual Activity  Alcohol Use Yes   Comment: occasional      No Known Allergies  Current Outpatient Medications  Medication Sig Dispense Refill  . aspirin EC 81 MG tablet Take 81 mg by mouth at bedtime.    . Cholecalciferol (VITAMIN D3) 5000 units CAPS Take 5,000 Units by mouth daily.    . Coenzyme Q10 (COQ10) 200 MG CAPS Take 200 mg by mouth every morning.    Marland Kitchen CRANBERRY PO Take 1 tablet by mouth 2 (two) times daily.    . diphenhydramine-acetaminophen (TYLENOL PM) 25-500 MG TABS tablet Take 2 tablets by mouth at bedtime.     Marland Kitchen  HYDROcodone-acetaminophen (NORCO/VICODIN) 5-325 MG tablet Take 1 tablet by mouth every 4 (four) hours as needed for moderate pain. 28 tablet 0  . meloxicam (MOBIC) 15 MG tablet Take 15 mg by mouth at bedtime.    . metoprolol succinate (TOPROL-XL) 50 MG 24 hr tablet Take 50 mg by mouth every morning. Take with or immediately following a meal.    . mirabegron ER (MYRBETRIQ) 50 MG TB24 tablet Take 50 mg by mouth daily.    . Misc Natural Products (PROSTATE HEALTH PO) Take 3 tablets by mouth daily.    . Multiple Vitamin (MULTIVITAMIN WITH MINERALS) TABS tablet Take 5 tablets by mouth daily.    . Multiple Vitamins-Minerals (OCULAR VITAMINS) TABS Take 1 tablet by mouth daily.    . Nutritional Supplements (WELLNESS ESSENTIALS FOR JOINT PO) Take 4 tablets by mouth daily.     . OIL OF OREGANO PO Take 1 capsule by mouth 2 (two) times daily.     Marland Kitchen olmesartan (BENICAR) 20 MG tablet Take 20 mg by mouth at bedtime.    Marland Kitchen omeprazole (PRILOSEC) 20 MG capsule Take 20 mg by mouth every morning.    . Probiotic CAPS Take 1 capsule by mouth daily.    . psyllium (METAMUCIL) 58.6 % packet Take 1 packet by mouth  daily as needed (constipation).     . RESVERATROL PO Take 2 tablets by mouth daily.    . rosuvastatin (CRESTOR) 40 MG tablet Take 40 mg by mouth at bedtime.     . tamsulosin (FLOMAX) 0.4 MG CAPS capsule Take 0.4 mg by mouth at bedtime.     . traMADol (ULTRAM) 50 MG tablet Take 1 tablet (50 mg total) by mouth every 6 (six) hours as needed (mild pain). 28 tablet 0   No current facility-administered medications for this visit.        Physical Exam: @   Diagnostic Studies & Laboratory data:     Recent Radiology Findings:   Dg Chest 2 View  Result Date: 07/20/2018 CLINICAL DATA:  Status post partial left lower lobectomy for resection lung nodule demonstrating benign pathology. EXAM: CHEST - 2 VIEW COMPARISON:  07/09/2018 FINDINGS: The heart size and mediastinal contours are within normal limits. Stable left  infrahilar soft tissue prominence postoperatively likely related to volume loss and some residual atelectasis. There is no evidence of pulmonary edema, consolidation, pneumothorax or pleural fluid. The visualized skeletal structures are unremarkable. IMPRESSION: Mild left infrahilar soft tissue prominence postoperatively likely related to volume loss and residual atelectasis. No pneumothorax or pleural fluid. Electronically Signed   By: Aletta Edouard M.D.   On: 07/20/2018 12:08    I have independently reviewed the above radiology studies  and reviewed the findings with the patient.    Recent Lab Findings: Lab Results  Component Value Date   WBC 10.9 (H) 07/07/2018   HGB 12.6 (L) 07/07/2018   HCT 38.1 (L) 07/07/2018   PLT 142 (L) 07/07/2018   GLUCOSE 112 (H) 07/07/2018   ALT 33 07/07/2018   AST 39 07/07/2018   NA 134 (L) 07/07/2018   K 4.4 07/07/2018   CL 105 07/07/2018   CREATININE 0.93 07/07/2018   BUN 23 07/07/2018   CO2 21 (L) 07/07/2018   INR 1.1 07/03/2018      Assessment / Plan:      Stable postop, postop chest x-ray satisfactory Increasing physical activity appropriately Patient to come by the office for removal of chest tube sutures Follow-up chest x-ray and office appointment in 1 month Instructions for lifting driving and returning to work discussed with the patient, no lifting for 3 to 4 weeks, he is able to drive, limited return to work from home as he tolerates      [name] MD      [address] [place] [address] Office [phone]   Beeper [phone]  07/20/2018 1:04 PM

## 2018-07-20 NOTE — Progress Notes (Deleted)
[address]       [place] [address]             [phone]      Johnny Peck [place] Medical Record #[id number] Date of Birth: 1946/04/02  Referring: Kathyrn Lass, MD Primary Care: Kathyrn Lass, MD Primary Cardiologist: No primary care provider on file.   Chief Complaint:   POST OP FOLLOW UP  History of Present Illness:          Past Medical History:  Diagnosis Date  . Arthritis   . Detached retina    od  . GERD (gastroesophageal reflux disease)   . Hyperlipidemia   . Hypertension   . Lung nodule   . Urinary incontinence      Social History   Tobacco Use  Smoking Status Former Smoker  . Last attempt to quit: 12/25/1983  . Years since quitting: 34.5  Smokeless Tobacco Never Used    Social History   Substance and Sexual Activity  Alcohol Use Yes   Comment: occasional      No Known Allergies  Current Outpatient Medications  Medication Sig Dispense Refill  . aspirin EC 81 MG tablet Take 81 mg by mouth at bedtime.    . Cholecalciferol (VITAMIN D3) 5000 units CAPS Take 5,000 Units by mouth daily.    . Coenzyme Q10 (COQ10) 200 MG CAPS Take 200 mg by mouth every morning.    Marland Kitchen CRANBERRY PO Take 1 tablet by mouth 2 (two) times daily.    . diphenhydramine-acetaminophen (TYLENOL PM) 25-500 MG TABS tablet Take 2 tablets by mouth at bedtime.     Marland Kitchen HYDROcodone-acetaminophen (NORCO/VICODIN) 5-325 MG tablet Take 1 tablet by mouth every 4 (four) hours as needed for moderate pain. 28 tablet 0  . meloxicam (MOBIC) 15 MG tablet Take 15 mg by mouth at bedtime.    . metoprolol succinate (TOPROL-XL) 50 MG 24 hr tablet Take 50 mg by mouth every morning. Take with or immediately following a meal.    . mirabegron ER (MYRBETRIQ) 50 MG TB24 tablet Take 50 mg by mouth daily.    . Misc Natural Products (PROSTATE HEALTH PO) Take 3 tablets by mouth daily.    . Multiple Vitamin (MULTIVITAMIN WITH MINERALS) TABS tablet Take 5 tablets by mouth daily.     . Multiple Vitamins-Minerals (OCULAR VITAMINS) TABS Take 1 tablet by mouth daily.    . Nutritional Supplements (WELLNESS ESSENTIALS FOR JOINT PO) Take 4 tablets by mouth daily.     . OIL OF OREGANO PO Take 1 capsule by mouth 2 (two) times daily.     Marland Kitchen olmesartan (BENICAR) 20 MG tablet Take 20 mg by mouth at bedtime.    Marland Kitchen omeprazole (PRILOSEC) 20 MG capsule Take 20 mg by mouth every morning.    . Probiotic CAPS Take 1 capsule by mouth daily.    . psyllium (METAMUCIL) 58.6 % packet Take 1 packet by mouth daily as needed (constipation).     . RESVERATROL PO Take 2 tablets by mouth daily.    . rosuvastatin (CRESTOR) 40 MG tablet Take 40 mg by mouth at bedtime.     . tamsulosin (FLOMAX) 0.4 MG CAPS capsule Take 0.4 mg by mouth at bedtime.     . traMADol (ULTRAM) 50 MG tablet Take 1 tablet (50 mg total) by mouth every 6 (six) hours as needed (mild pain). 28 tablet 0   No current facility-administered medications for this visit.  Physical Exam: There were no vitals taken for this visit.  {Physical EIHD:[id number]}   Diagnostic Studies & Laboratory data:     Recent Radiology Findings:   No results found.    Recent Lab Findings: Lab Results  Component Value Date   WBC 10.9 (H) 07/07/2018   HGB 12.6 (L) 07/07/2018   HCT 38.1 (L) 07/07/2018   PLT 142 (L) 07/07/2018   GLUCOSE 112 (H) 07/07/2018   ALT 33 07/07/2018   AST 39 07/07/2018   NA 134 (L) 07/07/2018   K 4.4 07/07/2018   CL 105 07/07/2018   CREATININE 0.93 07/07/2018   BUN 23 07/07/2018   CO2 21 (L) 07/07/2018   INR 1.1 07/03/2018      Assessment / Plan:            [name] MD      [address] [place] [address] Office [phone]   Beeper [phone]  07/20/2018 11:35 AM

## 2018-07-21 ENCOUNTER — Other Ambulatory Visit: Payer: Self-pay

## 2018-07-21 ENCOUNTER — Other Ambulatory Visit: Payer: Self-pay | Admitting: Cardiothoracic Surgery

## 2018-07-21 DIAGNOSIS — Z902 Acquired absence of lung [part of]: Secondary | ICD-10-CM

## 2018-07-24 ENCOUNTER — Ambulatory Visit (INDEPENDENT_AMBULATORY_CARE_PROVIDER_SITE_OTHER): Payer: Self-pay | Admitting: *Deleted

## 2018-07-24 ENCOUNTER — Other Ambulatory Visit: Payer: Self-pay

## 2018-07-24 DIAGNOSIS — Z902 Acquired absence of lung [part of]: Secondary | ICD-10-CM

## 2018-07-24 DIAGNOSIS — Z4802 Encounter for removal of sutures: Secondary | ICD-10-CM

## 2018-07-24 DIAGNOSIS — R911 Solitary pulmonary nodule: Secondary | ICD-10-CM

## 2018-07-24 NOTE — Progress Notes (Signed)
Mr. Johnny Peck returns for chest tube suture removal s/p left thoracic surgery on 07/05/26 with discharge on 07/09/18.  He is doing well at home.  Diet and bowels are good.  His thoracotomy incision is very well healed. There are two previous chest tube incisions/sutures.  Two of the sutures on one of the sites have loosened and only required a pull through.  The other one was easily removed.  He has requested to work from home for 4 hours each day x 2 weeks, beginning next week, then proceed to full time.  He said he had discussed this with Dr. Servando Snare.  I prepared a work release and faxed it to his employer.  He will return as scheduled or receive a call from Dr. Servando Snare in follow up as deemed appropriate.

## 2018-08-03 LAB — FUNGUS CULTURE WITH STAIN

## 2018-08-03 LAB — FUNGUS CULTURE RESULT

## 2018-08-03 LAB — FUNGAL ORGANISM REFLEX

## 2018-08-17 LAB — ACID FAST CULTURE WITH REFLEXED SENSITIVITIES (MYCOBACTERIA): Acid Fast Culture: NEGATIVE

## 2018-08-24 ENCOUNTER — Ambulatory Visit
Admission: RE | Admit: 2018-08-24 | Discharge: 2018-08-24 | Disposition: A | Discharge status: Home / Self Care | Payer: Medicare Other | Source: Ambulatory Visit | Attending: Cardiothoracic Surgery | Admitting: Cardiothoracic Surgery

## 2018-08-24 ENCOUNTER — Other Ambulatory Visit: Payer: Self-pay

## 2018-08-24 ENCOUNTER — Ambulatory Visit (INDEPENDENT_AMBULATORY_CARE_PROVIDER_SITE_OTHER): Payer: Self-pay | Admitting: Cardiothoracic Surgery

## 2018-08-24 ENCOUNTER — Encounter: Payer: Self-pay | Admitting: Cardiothoracic Surgery

## 2018-08-24 VITALS — BP 126/78 | HR 78 | Temp 97.9°F | Resp 16 | Ht 70.0 in | Wt 177.0 lb

## 2018-08-24 DIAGNOSIS — R911 Solitary pulmonary nodule: Secondary | ICD-10-CM

## 2018-08-24 DIAGNOSIS — Z902 Acquired absence of lung [part of]: Secondary | ICD-10-CM

## 2018-08-24 NOTE — Progress Notes (Signed)
[address]       [place] [address]             [phone]      [address] Medical Record #[id number] Date of Birth: Jan 13, 1946  Referring: Kathyrn Lass, MD Primary Care: Kathyrn Lass, MD Primary Cardiologist: No primary care provider on file.   Chief Complaint:   POST OP FOLLOW UP  07/05/2018  PREOPERATIVE DIAGNOSIS:  Left lower lobe hypermetabolic lung mass. POSTOPERATIVE DIAGNOSIS:  Left lower lobe hypermetabolic lung mass.  Benign mass by frozen section. PROCEDURES PERFORMED:   1.  Left video-assisted thoracoscopy. 2.  Mini thoracotomy. 3.  Partial left lower lobe lobectomy. 4.  Lymph node dissection with intercostal nerve block. SURGEON:  Lanelle Bal, MD  Diagnosis 1. Pleura, biopsy, visceral nodule, left lung - HYALINIZED GRANULOMA. 2. Lung, resection (segmental or lobe), partial left lower - HYALINIZED GRANULOMATA. SURFACE ADHESIONS. SEE COMMENT. 3. Lymph node, biopsy, 10L - ONE MORPHOLOGICALLY BENIGN LYMPH NODE. 4. Lymph node, biopsy, 11L - ONE MORPHOLOGICALLY BENIGN LYMPH NODE. 5. Lymph node, biopsy, level 8 - FIBROUS TISSUE. - NO NODAL TISSUE IDENTIFIED. 6. Lymph node, biopsy, 4L - ONE MORPHOLOGICALLY BENIGN LYMPH NODE. Microscopic Comment 2. Special stains AFB (acid fast bacillus) and GMS (fungus) are negative. Gillie Manners MD  History of Present Illness:   Patient doing well since last seen in the office.  He denies any significant discomfort over his chest incision.  He does note bilateral hand numbness at times.  He notes that this is been occurring before surgery but since his operation has been more noticeable.  He has evaluation already set up for evaluation for nerve entrapment possible carpal tunnel.  Path: Diagnosis 1. Pleura, biopsy, visceral nodule, left lung - HYALINIZED GRANULOMA. 2. Lung, resection (segmental or lobe), partial left lower - HYALINIZED GRANULOMATA. SURFACE ADHESIONS.  SEE COMMENT. 3. Lymph node, biopsy, 10L - ONE MORPHOLOGICALLY BENIGN LYMPH NODE. 4. Lymph node, biopsy, 11L - ONE MORPHOLOGICALLY BENIGN LYMPH NODE. 5. Lymph node, biopsy, level 8 - FIBROUS TISSUE. - NO NODAL TISSUE IDENTIFIED. 6. Lymph node, biopsy, 4L - ONE MORPHOLOGICALLY BENIGN LYMPH NODE. Microscopic Comment 2. Special stains AFB (acid fast bacillus) and GMS (fungus) are negative   Past Medical History:  Diagnosis Date  . Arthritis   . Detached retina    od  . GERD (gastroesophageal reflux disease)   . Hyperlipidemia   . Hypertension   . Lung nodule   . Urinary incontinence      Social History   Tobacco Use  Smoking Status Former Smoker  . Last attempt to quit: 12/25/1983  . Years since quitting: 34.6  Smokeless Tobacco Never Used    Social History   Substance and Sexual Activity  Alcohol Use Yes   Comment: occasional      No Known Allergies  Current Outpatient Medications  Medication Sig Dispense Refill  . aspirin EC 81 MG tablet Take 81 mg by mouth at bedtime.    . Cholecalciferol (VITAMIN D3) 5000 units CAPS Take 5,000 Units by mouth daily.    . Coenzyme Q10 (COQ10) 200 MG CAPS Take 200 mg by mouth every morning.    Marland Kitchen CRANBERRY PO Take 1 tablet by mouth 2 (two) times daily.    . diphenhydramine-acetaminophen (TYLENOL PM) 25-500 MG TABS tablet Take 2 tablets by mouth at bedtime.     . meloxicam (MOBIC) 15 MG tablet Take 15 mg by mouth at bedtime.    Marland Kitchen  metoprolol succinate (TOPROL-XL) 50 MG 24 hr tablet Take 50 mg by mouth every morning. Take with or immediately following a meal.    . mirabegron ER (MYRBETRIQ) 50 MG TB24 tablet Take 50 mg by mouth daily.    . Misc Natural Products (PROSTATE HEALTH PO) Take 3 tablets by mouth daily.    . Multiple Vitamin (MULTIVITAMIN WITH MINERALS) TABS tablet Take 5 tablets by mouth daily.    . Multiple Vitamins-Minerals (OCULAR VITAMINS) TABS Take 1 tablet by mouth daily.    . Nutritional Supplements (WELLNESS ESSENTIALS  FOR JOINT PO) Take 4 tablets by mouth daily.     . OIL OF OREGANO PO Take 1 capsule by mouth 2 (two) times daily.     Marland Kitchen olmesartan (BENICAR) 20 MG tablet Take 20 mg by mouth at bedtime.    Marland Kitchen omeprazole (PRILOSEC) 20 MG capsule Take 20 mg by mouth every morning.    . Probiotic CAPS Take 1 capsule by mouth daily.    . psyllium (METAMUCIL) 58.6 % packet Take 1 packet by mouth daily as needed (constipation).     . RESVERATROL PO Take 2 tablets by mouth daily.    . rosuvastatin (CRESTOR) 40 MG tablet Take 40 mg by mouth at bedtime.     . tadalafil (CIALIS) 5 MG tablet     . tamsulosin (FLOMAX) 0.4 MG CAPS capsule Take 0.4 mg by mouth at bedtime.     . traMADol (ULTRAM) 50 MG tablet Take 1 tablet (50 mg total) by mouth every 6 (six) hours as needed (mild pain). 28 tablet 0   No current facility-administered medications for this visit.        Physical Exam:BP 126/78 (BP Location: Right Arm, Patient Position: Sitting, Cuff Size: Large)   Pulse 78   Temp 97.9 F (36.6 C) (Skin)   Resp 16   Ht 5\' 10"  (1.778 m)   Wt 177 lb (80.3 kg)   SpO2 98% Comment: RA  BMI 25.40 kg/m   General appearance: alert, cooperative and no distress Head: Normocephalic, without obvious abnormality, atraumatic Lymph nodes: Cervical, supraclavicular, and axillary nodes normal. Resp: clear to auscultation bilaterally Cardio: regular rate and rhythm, S1, S2 normal, no murmur, click, rub or gallop GI: soft, non-tender; bowel sounds normal; no masses,  no organomegaly Extremities: Patient has normal strength in both hands ,extremities normal, atraumatic, no cyanosis or edema and Homans sign is negative, no sign of DVT Neurologic: Grossly normal Wounds well healed   Diagnostic Studies & Laboratory data:     Recent Radiology Findings:   Dg Chest 2 View  Result Date: 08/24/2018 CLINICAL DATA:  73 year old male with history of VATS 07/05/2018 EXAM: CHEST - 2 VIEW COMPARISON:  07/20/2018 FINDINGS: Cardiomediastinal  silhouette unchanged in size and contour. No pneumothorax or pleural effusion. No confluent airspace disease. Surgical changes of the left hilar region. Improving aeration of the left lung. No displaced fracture. IMPRESSION: Surgical changes of the left hilar region with improving aeration and no evidence of acute cardiopulmonary disease Electronically Signed   By: Corrie Mckusick D.O.   On: 08/24/2018 09:39    I have independently reviewed the above radiology studies  and reviewed the findings with the patient.    Recent Lab Findings: Lab Results  Component Value Date   WBC 10.9 (H) 07/07/2018   HGB 12.6 (L) 07/07/2018   HCT 38.1 (L) 07/07/2018   PLT 142 (L) 07/07/2018   GLUCOSE 112 (H) 07/07/2018   ALT 33 07/07/2018  AST 39 07/07/2018   NA 134 (L) 07/07/2018   K 4.4 07/07/2018   CL 105 07/07/2018   CREATININE 0.93 07/07/2018   BUN 23 07/07/2018   CO2 21 (L) 07/07/2018   INR 1.1 07/03/2018      Assessment / Plan:      Stable postop, postop chest x-ray satisfactory, granuloma with negative smears and cultures Follow up ct chest one year Patient notes he already has appointments for evaluation of possible carpal tunnel.   [name] MD      [address] [place] [address] Office [phone]   Beeper [phone]  08/24/2018 11:14 AM

## 2018-09-04 DIAGNOSIS — H2512 Age-related nuclear cataract, left eye: Secondary | ICD-10-CM | POA: Diagnosis not present

## 2018-09-04 DIAGNOSIS — H04123 Dry eye syndrome of bilateral lacrimal glands: Secondary | ICD-10-CM | POA: Diagnosis not present

## 2018-09-04 DIAGNOSIS — H52203 Unspecified astigmatism, bilateral: Secondary | ICD-10-CM | POA: Diagnosis not present

## 2018-11-10 DIAGNOSIS — M1711 Unilateral primary osteoarthritis, right knee: Secondary | ICD-10-CM | POA: Diagnosis not present

## 2018-11-10 DIAGNOSIS — M25561 Pain in right knee: Secondary | ICD-10-CM | POA: Diagnosis not present

## 2018-11-15 DIAGNOSIS — M79641 Pain in right hand: Secondary | ICD-10-CM | POA: Diagnosis not present

## 2018-11-15 DIAGNOSIS — M79642 Pain in left hand: Secondary | ICD-10-CM | POA: Diagnosis not present

## 2018-11-15 DIAGNOSIS — M65332 Trigger finger, left middle finger: Secondary | ICD-10-CM | POA: Diagnosis not present

## 2018-11-23 DIAGNOSIS — M1711 Unilateral primary osteoarthritis, right knee: Secondary | ICD-10-CM | POA: Diagnosis not present

## 2018-11-23 DIAGNOSIS — M25561 Pain in right knee: Secondary | ICD-10-CM | POA: Diagnosis not present

## 2018-11-28 DIAGNOSIS — G5601 Carpal tunnel syndrome, right upper limb: Secondary | ICD-10-CM | POA: Diagnosis not present

## 2018-11-28 DIAGNOSIS — G5603 Carpal tunnel syndrome, bilateral upper limbs: Secondary | ICD-10-CM | POA: Diagnosis not present

## 2018-11-28 DIAGNOSIS — M65332 Trigger finger, left middle finger: Secondary | ICD-10-CM | POA: Diagnosis not present

## 2018-11-30 DIAGNOSIS — M1711 Unilateral primary osteoarthritis, right knee: Secondary | ICD-10-CM | POA: Diagnosis not present

## 2018-11-30 DIAGNOSIS — M25561 Pain in right knee: Secondary | ICD-10-CM | POA: Diagnosis not present

## 2018-11-30 DIAGNOSIS — Z23 Encounter for immunization: Secondary | ICD-10-CM | POA: Diagnosis not present

## 2018-12-04 DIAGNOSIS — G5603 Carpal tunnel syndrome, bilateral upper limbs: Secondary | ICD-10-CM | POA: Diagnosis not present

## 2018-12-05 DIAGNOSIS — G5603 Carpal tunnel syndrome, bilateral upper limbs: Secondary | ICD-10-CM | POA: Diagnosis not present

## 2018-12-05 DIAGNOSIS — M65332 Trigger finger, left middle finger: Secondary | ICD-10-CM | POA: Diagnosis not present

## 2018-12-07 DIAGNOSIS — M1711 Unilateral primary osteoarthritis, right knee: Secondary | ICD-10-CM | POA: Diagnosis not present

## 2018-12-22 DIAGNOSIS — G5602 Carpal tunnel syndrome, left upper limb: Secondary | ICD-10-CM | POA: Diagnosis not present

## 2019-01-17 DIAGNOSIS — D229 Melanocytic nevi, unspecified: Secondary | ICD-10-CM | POA: Diagnosis not present

## 2019-01-17 DIAGNOSIS — L819 Disorder of pigmentation, unspecified: Secondary | ICD-10-CM | POA: Diagnosis not present

## 2019-01-17 DIAGNOSIS — L814 Other melanin hyperpigmentation: Secondary | ICD-10-CM | POA: Diagnosis not present

## 2019-01-17 DIAGNOSIS — L570 Actinic keratosis: Secondary | ICD-10-CM | POA: Diagnosis not present

## 2019-01-17 DIAGNOSIS — L821 Other seborrheic keratosis: Secondary | ICD-10-CM | POA: Diagnosis not present

## 2019-01-29 DIAGNOSIS — Z125 Encounter for screening for malignant neoplasm of prostate: Secondary | ICD-10-CM | POA: Diagnosis not present

## 2019-01-29 DIAGNOSIS — N401 Enlarged prostate with lower urinary tract symptoms: Secondary | ICD-10-CM | POA: Diagnosis not present

## 2019-01-29 DIAGNOSIS — R7303 Prediabetes: Secondary | ICD-10-CM | POA: Diagnosis not present

## 2019-01-29 DIAGNOSIS — Z0000 Encounter for general adult medical examination without abnormal findings: Secondary | ICD-10-CM | POA: Diagnosis not present

## 2019-01-29 DIAGNOSIS — E785 Hyperlipidemia, unspecified: Secondary | ICD-10-CM | POA: Diagnosis not present

## 2019-01-29 DIAGNOSIS — I10 Essential (primary) hypertension: Secondary | ICD-10-CM | POA: Diagnosis not present

## 2019-02-06 DIAGNOSIS — G5601 Carpal tunnel syndrome, right upper limb: Secondary | ICD-10-CM | POA: Diagnosis not present

## 2019-02-14 DIAGNOSIS — R7303 Prediabetes: Secondary | ICD-10-CM | POA: Diagnosis not present

## 2019-02-14 DIAGNOSIS — Z0000 Encounter for general adult medical examination without abnormal findings: Secondary | ICD-10-CM | POA: Diagnosis not present

## 2019-02-14 DIAGNOSIS — I10 Essential (primary) hypertension: Secondary | ICD-10-CM | POA: Diagnosis not present

## 2019-02-14 DIAGNOSIS — Z125 Encounter for screening for malignant neoplasm of prostate: Secondary | ICD-10-CM | POA: Diagnosis not present

## 2019-02-14 DIAGNOSIS — E785 Hyperlipidemia, unspecified: Secondary | ICD-10-CM | POA: Diagnosis not present

## 2019-02-14 DIAGNOSIS — N401 Enlarged prostate with lower urinary tract symptoms: Secondary | ICD-10-CM | POA: Diagnosis not present

## 2019-05-10 DIAGNOSIS — Z23 Encounter for immunization: Secondary | ICD-10-CM | POA: Diagnosis not present

## 2019-05-22 DIAGNOSIS — K219 Gastro-esophageal reflux disease without esophagitis: Secondary | ICD-10-CM | POA: Diagnosis not present

## 2019-05-22 DIAGNOSIS — N401 Enlarged prostate with lower urinary tract symptoms: Secondary | ICD-10-CM | POA: Diagnosis not present

## 2019-05-22 DIAGNOSIS — I10 Essential (primary) hypertension: Secondary | ICD-10-CM | POA: Diagnosis not present

## 2019-05-22 DIAGNOSIS — M1990 Unspecified osteoarthritis, unspecified site: Secondary | ICD-10-CM | POA: Diagnosis not present

## 2019-05-22 DIAGNOSIS — E785 Hyperlipidemia, unspecified: Secondary | ICD-10-CM | POA: Diagnosis not present

## 2019-05-28 ENCOUNTER — Ambulatory Visit: Payer: Medicare Other

## 2019-05-31 DIAGNOSIS — Z23 Encounter for immunization: Secondary | ICD-10-CM | POA: Diagnosis not present

## 2019-07-27 ENCOUNTER — Other Ambulatory Visit: Payer: Self-pay | Admitting: *Deleted

## 2019-07-27 DIAGNOSIS — Z902 Acquired absence of lung [part of]: Secondary | ICD-10-CM

## 2019-07-27 DIAGNOSIS — R911 Solitary pulmonary nodule: Secondary | ICD-10-CM

## 2019-07-27 DIAGNOSIS — Z85118 Personal history of other malignant neoplasm of bronchus and lung: Secondary | ICD-10-CM

## 2019-08-30 ENCOUNTER — Ambulatory Visit
Admission: RE | Admit: 2019-08-30 | Discharge: 2019-08-30 | Disposition: A | Discharge status: Home / Self Care | Payer: Medicare Other | Source: Ambulatory Visit | Attending: Cardiothoracic Surgery | Admitting: Cardiothoracic Surgery

## 2019-08-30 ENCOUNTER — Other Ambulatory Visit: Payer: Self-pay

## 2019-08-30 ENCOUNTER — Ambulatory Visit (INDEPENDENT_AMBULATORY_CARE_PROVIDER_SITE_OTHER): Payer: Medicare Other | Admitting: Cardiothoracic Surgery

## 2019-08-30 DIAGNOSIS — R911 Solitary pulmonary nodule: Secondary | ICD-10-CM

## 2019-08-30 DIAGNOSIS — Z902 Acquired absence of lung [part of]: Secondary | ICD-10-CM

## 2019-08-30 DIAGNOSIS — I700 Atherosclerosis of aorta: Secondary | ICD-10-CM | POA: Diagnosis not present

## 2019-08-30 DIAGNOSIS — Z85118 Personal history of other malignant neoplasm of bronchus and lung: Secondary | ICD-10-CM

## 2019-08-30 NOTE — Progress Notes (Signed)
[address]       [place] [address]             [phone]    Virtual Visit via Telephone Note  I connected with Claris Che Belkin on 08/30/19 at 12:00 PM EDT by telephone and verified that I am speaking with the correct person using two identifiers.      [facility] Medical Record [id number] Date of Birth: [birth year]  Referring: Kathyrn Lass, MD Primary Care: Kathyrn Lass, MD Primary Cardiologist: No primary care provider on file.   Chief Complaint:   POST OP FOLLOW UP  07/05/2018  PREOPERATIVE DIAGNOSIS:  Left lower lobe hypermetabolic lung mass. POSTOPERATIVE DIAGNOSIS:  Left lower lobe hypermetabolic lung mass.  Benign mass by frozen section. PROCEDURES PERFORMED:   1.  Left video-assisted thoracoscopy. 2.  Mini thoracotomy. 3.  Partial left lower lobe lobectomy. 4.  Lymph node dissection with intercostal nerve block. SURGEON:  Lanelle Bal, MD  Diagnosis 1. Pleura, biopsy, visceral nodule, left lung - HYALINIZED GRANULOMA. 2. Lung, resection (segmental or lobe), partial left lower - HYALINIZED GRANULOMATA. SURFACE ADHESIONS. SEE COMMENT. 3. Lymph node, biopsy, 10L - ONE MORPHOLOGICALLY BENIGN LYMPH NODE. 4. Lymph node, biopsy, 11L - ONE MORPHOLOGICALLY BENIGN LYMPH NODE. 5. Lymph node, biopsy, level 8 - FIBROUS TISSUE. - NO NODAL TISSUE IDENTIFIED. 6. Lymph node, biopsy, 4L - ONE MORPHOLOGICALLY BENIGN LYMPH NODE. Microscopic Comment 2. Special stains AFB (acid fast bacillus) and GMS (fungus) are negative. Gillie Manners MD  History of Present Illness:   Patient doing well since last seen in the office.  He denies any significant discomfort over his chest incision.  He has not noted any Covid related infection    Path: Diagnosis 1. Pleura, biopsy, visceral nodule, left lung - HYALINIZED GRANULOMA. 2. Lung, resection (segmental or lobe), partial left lower - HYALINIZED GRANULOMATA. SURFACE  ADHESIONS. SEE COMMENT. 3. Lymph node, biopsy, 10L - ONE MORPHOLOGICALLY BENIGN LYMPH NODE. 4. Lymph node, biopsy, 11L - ONE MORPHOLOGICALLY BENIGN LYMPH NODE. 5. Lymph node, biopsy, level 8 - FIBROUS TISSUE. - NO NODAL TISSUE IDENTIFIED. 6. Lymph node, biopsy, 4L - ONE MORPHOLOGICALLY BENIGN LYMPH NODE. Microscopic Comment 2. Special stains AFB (acid fast bacillus) and GMS (fungus) are negative   Past Medical History:  Diagnosis Date  . Arthritis   . Detached retina    od  . GERD (gastroesophageal reflux disease)   . Hyperlipidemia   . Hypertension   . Lung nodule   . Urinary incontinence      Social History   Tobacco Use  Smoking Status Former Smoker  . Quit date: 12/25/1983  . Years since quitting: 35.7  Smokeless Tobacco Never Used    Social History   Substance and Sexual Activity  Alcohol Use Yes   Comment: occasional      No Known Allergies  Current Outpatient Medications  Medication Sig Dispense Refill  . aspirin EC 81 MG tablet Take 81 mg by mouth at bedtime.    . Cholecalciferol (VITAMIN D3) 5000 units CAPS Take 5,000 Units by mouth daily.    . Coenzyme Q10 (COQ10) 200 MG CAPS Take 200 mg by mouth every morning.    Marland Kitchen CRANBERRY PO Take 1 tablet by mouth 2 (two) times daily.    . diphenhydramine-acetaminophen (TYLENOL PM) 25-500 MG TABS tablet Take 2 tablets by mouth at bedtime.     . meloxicam (MOBIC) 15 MG tablet Take 15 mg by mouth at  bedtime.    . metoprolol succinate (TOPROL-XL) 50 MG 24 hr tablet Take 50 mg by mouth every morning. Take with or immediately following a meal.    . mirabegron ER (MYRBETRIQ) 50 MG TB24 tablet Take 50 mg by mouth daily.    . Misc Natural Products (PROSTATE HEALTH PO) Take 3 tablets by mouth daily.    . Multiple Vitamin (MULTIVITAMIN WITH MINERALS) TABS tablet Take 5 tablets by mouth daily.    . Multiple Vitamins-Minerals (OCULAR VITAMINS) TABS Take 1 tablet by mouth daily.    . Nutritional Supplements (WELLNESS ESSENTIALS  FOR JOINT PO) Take 4 tablets by mouth daily.     . OIL OF OREGANO PO Take 1 capsule by mouth 2 (two) times daily.     Marland Kitchen olmesartan (BENICAR) 20 MG tablet Take 20 mg by mouth at bedtime.    Marland Kitchen omeprazole (PRILOSEC) 20 MG capsule Take 20 mg by mouth every morning.    . Probiotic CAPS Take 1 capsule by mouth daily.    . psyllium (METAMUCIL) 58.6 % packet Take 1 packet by mouth daily as needed (constipation).     . RESVERATROL PO Take 2 tablets by mouth daily.    . rosuvastatin (CRESTOR) 40 MG tablet Take 40 mg by mouth at bedtime.     . tadalafil (CIALIS) 5 MG tablet     . tamsulosin (FLOMAX) 0.4 MG CAPS capsule Take 0.4 mg by mouth at bedtime.     . traMADol (ULTRAM) 50 MG tablet Take 1 tablet (50 mg total) by mouth every 6 (six) hours as needed (mild pain). 28 tablet 0   No current facility-administered medications for this visit.       Physical Exam:There were no vitals taken for this visit. No PE   Diagnostic Studies & Laboratory data:     Recent Radiology Findings:   CT CHEST WO CONTRAST  Result Date: 08/30/2019 CLINICAL DATA:  74 year old male with history of pulmonary nodule. Follow-up study. EXAM: CT CHEST WITHOUT CONTRAST TECHNIQUE: Multidetector CT imaging of the chest was performed following the standard protocol without IV contrast. COMPARISON:  Chest CT 05/29/2018.  PET-CT 06/27/2018. FINDINGS: Cardiovascular: Heart size is normal. There is no significant pericardial fluid, thickening or pericardial calcification. There is aortic atherosclerosis, as well as atherosclerosis of the great vessels of the mediastinum and the coronary arteries, including calcified atherosclerotic plaque in the left main, left anterior descending, left circumflex and right coronary arteries. Mediastinum/Nodes: No pathologically enlarged mediastinal or hilar lymph nodes. Please note that accurate exclusion of hilar adenopathy is limited on noncontrast CT scans. Esophagus is unremarkable in appearance. No  axillary lymphadenopathy. Lungs/Pleura: New postoperative changes of wedge resection in the left lower lobe. Previously noted pulmonary nodule has been resected. No new suspicious appearing pulmonary nodules or masses are noted. No acute consolidative airspace disease. No pleural effusions. Upper Abdomen: Unremarkable. Musculoskeletal: There are no aggressive appearing lytic or blastic lesions noted in the visualized portions of the skeleton. IMPRESSION: 1. Status post wedge resection in the left lower lobe. No findings to suggest locally recurrent disease or metastatic disease in the thorax. 2. Aortic atherosclerosis, in addition to left main and 3 vessel coronary artery disease. Assessment for potential risk factor modification, dietary therapy or pharmacologic therapy may be warranted, if clinically indicated. Aortic Atherosclerosis (ICD10-I70.0). Electronically Signed   By: Vinnie Langton M.D.   On: 08/30/2019 11:00    I have independently reviewed the above radiology studies  and reviewed the findings with the  patient.    Recent Lab Findings: Lab Results  Component Value Date   WBC 10.9 (H) 07/07/2018   HGB 12.6 (L) 07/07/2018   HCT 38.1 (L) 07/07/2018   PLT 142 (L) 07/07/2018   GLUCOSE 112 (H) 07/07/2018   ALT 33 07/07/2018   AST 39 07/07/2018   NA 134 (L) 07/07/2018   K 4.4 07/07/2018   CL 105 07/07/2018   CREATININE 0.93 07/07/2018   BUN 23 07/07/2018   CO2 21 (L) 07/07/2018   INR 1.1 07/03/2018      Assessment / Plan:   #1 status post resection of granuloma left lower lobe approximately 1 year ago-CT scan done today shows no evidence of recurrent disease or new lung nodules #2 CT scan incidentally does show evidence of coronary calcification-this was discussed with the patient he notes that he has no symptoms of chest pain angina or congestive heart failure, he does modify his diet stays on statin.  He will discuss with primary care referral to cardiology   Since patient  does not have underlying malignant disease we we will not obtain a further follow-up CT scans postoperatively      [name] MD      [address] [place] [address] Office [phone]   Beeper [phone]  08/30/2019 12:44 PM

## 2019-09-11 DIAGNOSIS — H524 Presbyopia: Secondary | ICD-10-CM | POA: Diagnosis not present

## 2019-09-11 DIAGNOSIS — H2512 Age-related nuclear cataract, left eye: Secondary | ICD-10-CM | POA: Diagnosis not present

## 2019-09-11 DIAGNOSIS — E119 Type 2 diabetes mellitus without complications: Secondary | ICD-10-CM | POA: Diagnosis not present

## 2019-09-27 DIAGNOSIS — Z20822 Contact with and (suspected) exposure to covid-19: Secondary | ICD-10-CM | POA: Diagnosis not present

## 2019-11-08 DIAGNOSIS — Z9852 Vasectomy status: Secondary | ICD-10-CM | POA: Diagnosis not present

## 2019-11-08 DIAGNOSIS — N35919 Unspecified urethral stricture, male, unspecified site: Secondary | ICD-10-CM | POA: Diagnosis not present

## 2019-11-08 DIAGNOSIS — Z87448 Personal history of other diseases of urinary system: Secondary | ICD-10-CM | POA: Diagnosis not present

## 2019-11-08 DIAGNOSIS — R399 Unspecified symptoms and signs involving the genitourinary system: Secondary | ICD-10-CM | POA: Diagnosis not present

## 2019-12-20 DIAGNOSIS — Z01818 Encounter for other preprocedural examination: Secondary | ICD-10-CM | POA: Diagnosis not present

## 2019-12-20 DIAGNOSIS — Z87448 Personal history of other diseases of urinary system: Secondary | ICD-10-CM | POA: Diagnosis not present

## 2019-12-20 DIAGNOSIS — N35912 Unspecified bulbous urethral stricture, male: Secondary | ICD-10-CM | POA: Diagnosis not present

## 2019-12-20 DIAGNOSIS — R399 Unspecified symptoms and signs involving the genitourinary system: Secondary | ICD-10-CM | POA: Diagnosis not present

## 2020-01-11 DIAGNOSIS — Z23 Encounter for immunization: Secondary | ICD-10-CM | POA: Diagnosis not present

## 2020-01-25 DIAGNOSIS — Z01812 Encounter for preprocedural laboratory examination: Secondary | ICD-10-CM | POA: Diagnosis not present

## 2020-01-25 DIAGNOSIS — Z20822 Contact with and (suspected) exposure to covid-19: Secondary | ICD-10-CM | POA: Diagnosis not present

## 2020-01-28 DIAGNOSIS — K219 Gastro-esophageal reflux disease without esophagitis: Secondary | ICD-10-CM | POA: Diagnosis not present

## 2020-01-28 DIAGNOSIS — N35919 Unspecified urethral stricture, male, unspecified site: Secondary | ICD-10-CM | POA: Diagnosis not present

## 2020-01-28 DIAGNOSIS — E7800 Pure hypercholesterolemia, unspecified: Secondary | ICD-10-CM | POA: Diagnosis not present

## 2020-01-28 DIAGNOSIS — E785 Hyperlipidemia, unspecified: Secondary | ICD-10-CM | POA: Diagnosis not present

## 2020-01-28 DIAGNOSIS — Z87891 Personal history of nicotine dependence: Secondary | ICD-10-CM | POA: Diagnosis not present

## 2020-01-28 DIAGNOSIS — M1990 Unspecified osteoarthritis, unspecified site: Secondary | ICD-10-CM | POA: Diagnosis not present

## 2020-01-28 DIAGNOSIS — Z79899 Other long term (current) drug therapy: Secondary | ICD-10-CM | POA: Diagnosis not present

## 2020-01-28 DIAGNOSIS — N35813 Other membranous urethral stricture, male: Secondary | ICD-10-CM | POA: Diagnosis not present

## 2020-01-28 DIAGNOSIS — I10 Essential (primary) hypertension: Secondary | ICD-10-CM | POA: Diagnosis not present

## 2020-01-28 DIAGNOSIS — Z87448 Personal history of other diseases of urinary system: Secondary | ICD-10-CM | POA: Diagnosis not present

## 2020-01-30 DIAGNOSIS — Z23 Encounter for immunization: Secondary | ICD-10-CM | POA: Diagnosis not present

## 2020-01-30 DIAGNOSIS — Z125 Encounter for screening for malignant neoplasm of prostate: Secondary | ICD-10-CM | POA: Diagnosis not present

## 2020-01-30 DIAGNOSIS — Z1389 Encounter for screening for other disorder: Secondary | ICD-10-CM | POA: Diagnosis not present

## 2020-01-30 DIAGNOSIS — Z0000 Encounter for general adult medical examination without abnormal findings: Secondary | ICD-10-CM | POA: Diagnosis not present

## 2020-02-01 DIAGNOSIS — E663 Overweight: Secondary | ICD-10-CM | POA: Diagnosis not present

## 2020-02-01 DIAGNOSIS — M1990 Unspecified osteoarthritis, unspecified site: Secondary | ICD-10-CM | POA: Diagnosis not present

## 2020-02-01 DIAGNOSIS — I2510 Atherosclerotic heart disease of native coronary artery without angina pectoris: Secondary | ICD-10-CM | POA: Diagnosis not present

## 2020-02-01 DIAGNOSIS — Z6829 Body mass index (BMI) 29.0-29.9, adult: Secondary | ICD-10-CM | POA: Diagnosis not present

## 2020-02-01 DIAGNOSIS — Z79899 Other long term (current) drug therapy: Secondary | ICD-10-CM | POA: Diagnosis not present

## 2020-02-01 DIAGNOSIS — N401 Enlarged prostate with lower urinary tract symptoms: Secondary | ICD-10-CM | POA: Diagnosis not present

## 2020-02-01 DIAGNOSIS — K219 Gastro-esophageal reflux disease without esophagitis: Secondary | ICD-10-CM | POA: Diagnosis not present

## 2020-02-01 DIAGNOSIS — I10 Essential (primary) hypertension: Secondary | ICD-10-CM | POA: Diagnosis not present

## 2020-02-01 DIAGNOSIS — I700 Atherosclerosis of aorta: Secondary | ICD-10-CM | POA: Diagnosis not present

## 2020-02-01 DIAGNOSIS — E785 Hyperlipidemia, unspecified: Secondary | ICD-10-CM | POA: Diagnosis not present

## 2020-02-04 DIAGNOSIS — Z87448 Personal history of other diseases of urinary system: Secondary | ICD-10-CM | POA: Diagnosis not present

## 2020-02-15 DIAGNOSIS — M25561 Pain in right knee: Secondary | ICD-10-CM | POA: Diagnosis not present

## 2020-02-15 DIAGNOSIS — M1711 Unilateral primary osteoarthritis, right knee: Secondary | ICD-10-CM | POA: Diagnosis not present

## 2020-02-15 DIAGNOSIS — M170 Bilateral primary osteoarthritis of knee: Secondary | ICD-10-CM | POA: Diagnosis not present

## 2020-02-25 DIAGNOSIS — M545 Low back pain, unspecified: Secondary | ICD-10-CM | POA: Diagnosis not present

## 2020-03-18 DIAGNOSIS — M545 Low back pain, unspecified: Secondary | ICD-10-CM | POA: Diagnosis not present

## 2020-03-18 IMAGING — DX PORTABLE CHEST - 1 VIEW
1 series · 1 of 1 positions shown · non-contrast
Comparison: 07/03/2018

CLINICAL DATA: Chest tubes present.  Status post VATS.

EXAM:
PORTABLE CHEST 1 VIEW

[chest]
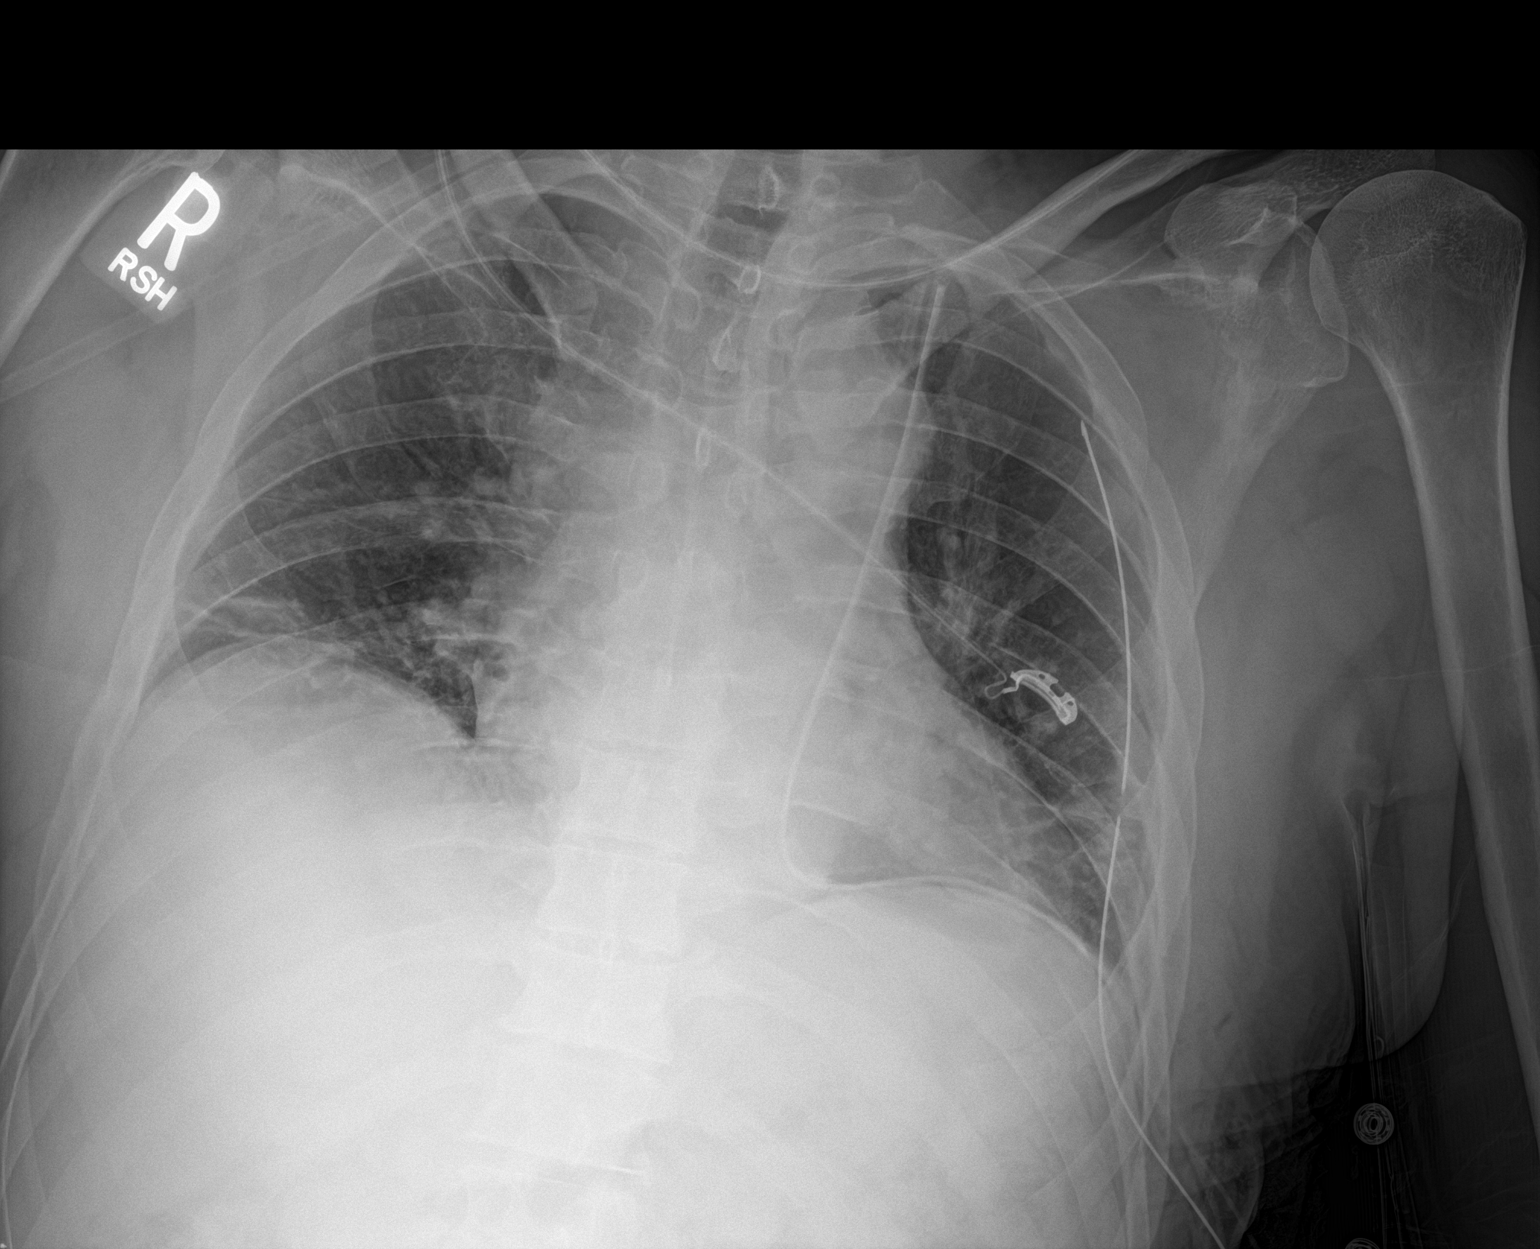

[1 of 1 positions shown; findings below may reference images not displayed]

FINDINGS: There are 2 left-sided chest tubes. Both tubes are near the left
lung apex. No significant pneumothorax. Patchy linear densities at
the lung bases are suggestive for atelectasis. Decreased lung
volumes. Heart size is normal for low lung volumes. Trachea is
midline.
IMPRESSION: Left chest tubes without a pneumothorax.

Bibasilar atelectasis.

## 2020-03-19 IMAGING — DX PORTABLE CHEST - 1 VIEW
1 series · 1 of 1 positions shown · non-contrast
Comparison: 07/05/2018.

CLINICAL DATA: Chest tube present with pneumothorax.

EXAM:
PORTABLE CHEST 1 VIEW

[chest]
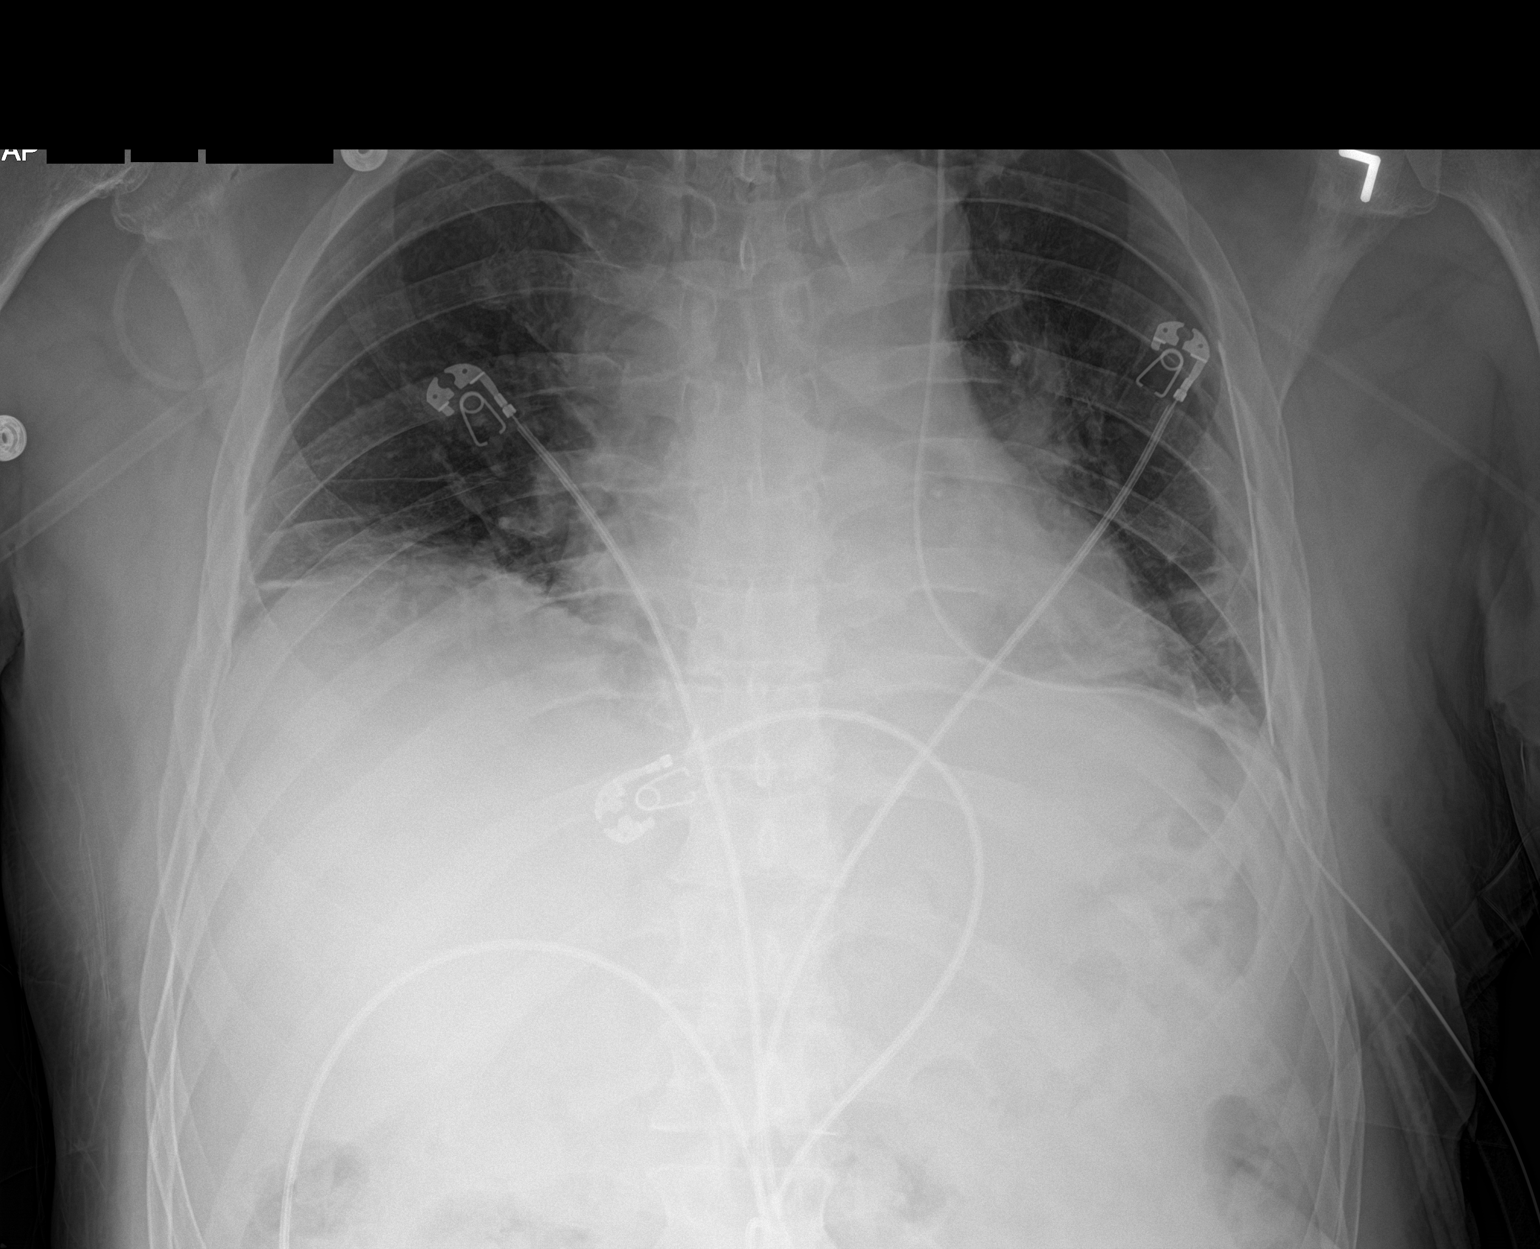

[1 of 1 positions shown; findings below may reference images not displayed]

FINDINGS: The heart is enlarged. LEFT chest tubes are redemonstrated and
stable. No significant pneumothorax. Bibasilar atelectasis is
unchanged.
IMPRESSION: Stable chest. No significant pneumothorax.

## 2020-03-20 IMAGING — DX PORTABLE CHEST - 1 VIEW
1 series · 1 of 1 positions shown · non-contrast
Comparison: Yesterday

CLINICAL DATA: Chest tube

EXAM:
PORTABLE CHEST 1 VIEW

[chest ap]
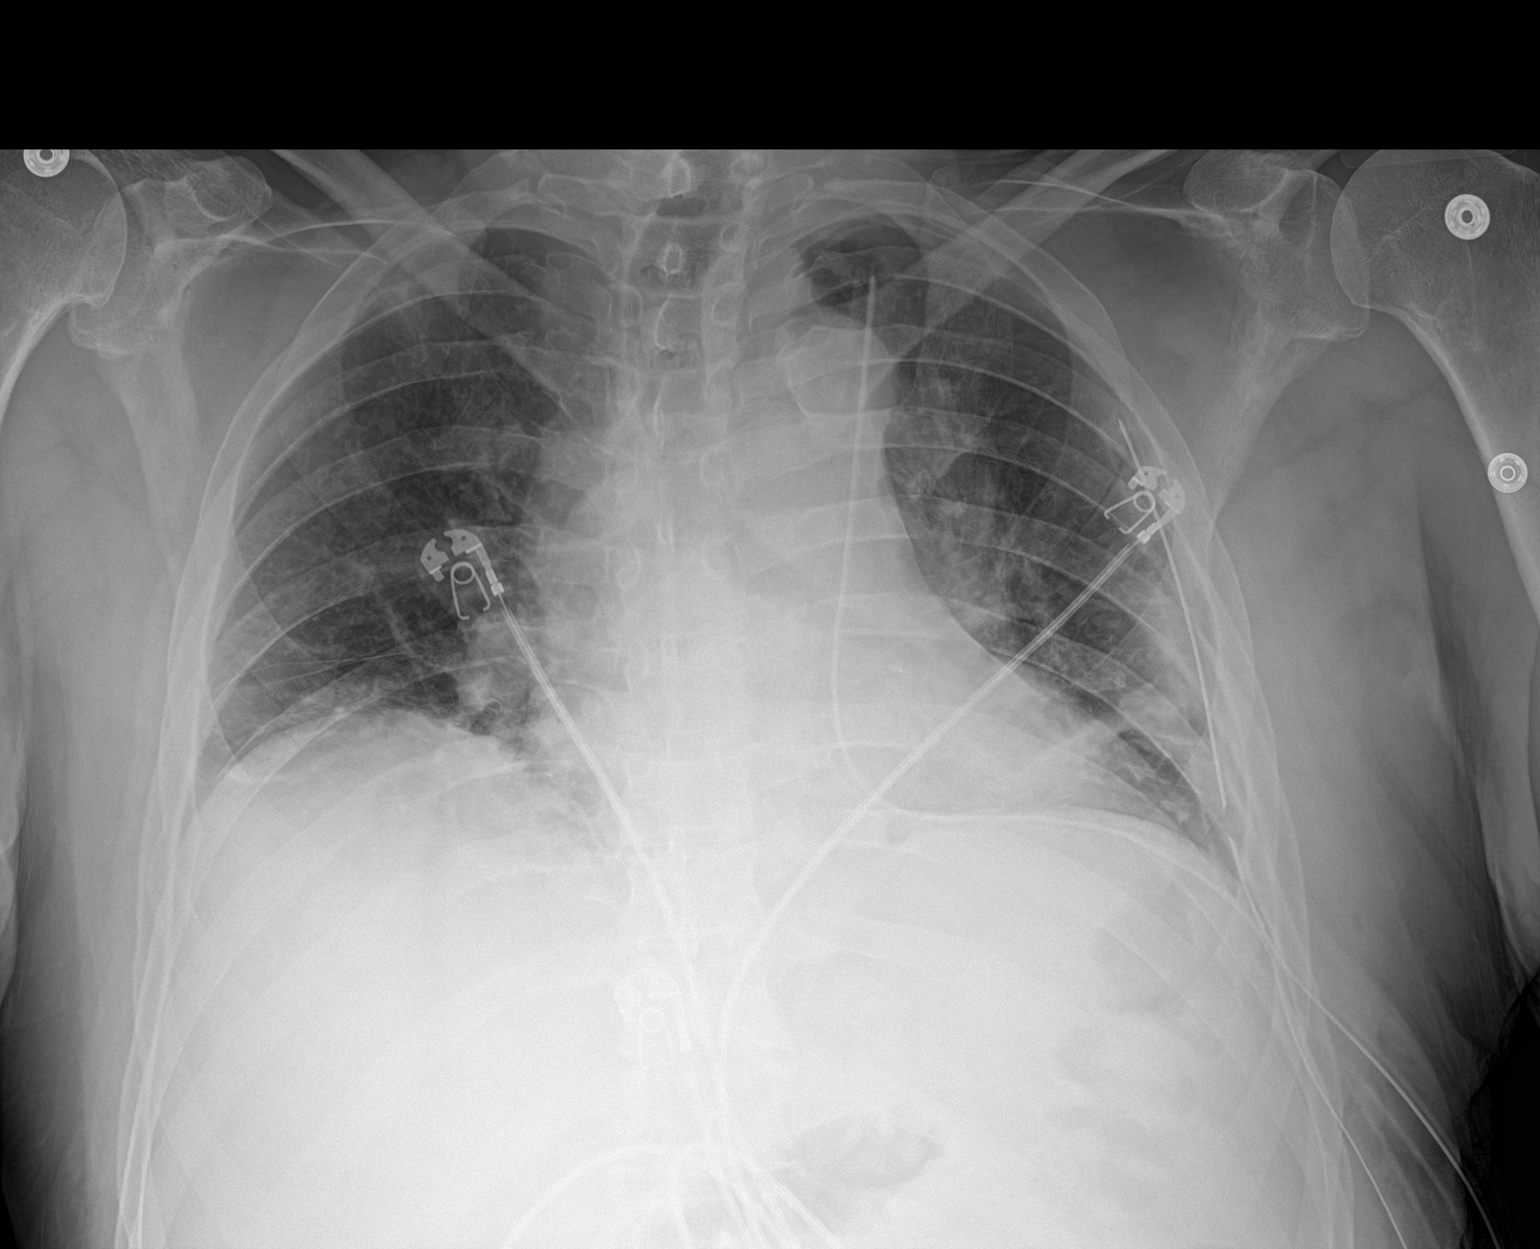

[1 of 1 positions shown; findings below may reference images not displayed]

FINDINGS: Trace left apical pneumothorax. Stable position of two left chest
tubes. Streaky atelectasis. Cardiopericardial enlargement.
IMPRESSION: Trace left apical pneumothorax.

## 2020-03-21 DIAGNOSIS — M545 Low back pain, unspecified: Secondary | ICD-10-CM | POA: Diagnosis not present

## 2020-03-21 DIAGNOSIS — M1711 Unilateral primary osteoarthritis, right knee: Secondary | ICD-10-CM | POA: Diagnosis not present

## 2020-03-21 IMAGING — DX PORTABLE CHEST - 1 VIEW
1 series · 1 of 1 positions shown · non-contrast
Comparison: Single-view of the chest 07/07/2018 and 07/06/2018.

CLINICAL DATA: Patient status post partial left lower lobectomy
07/05/2018.

EXAM:
PORTABLE CHEST 1 VIEW

[chest ap]
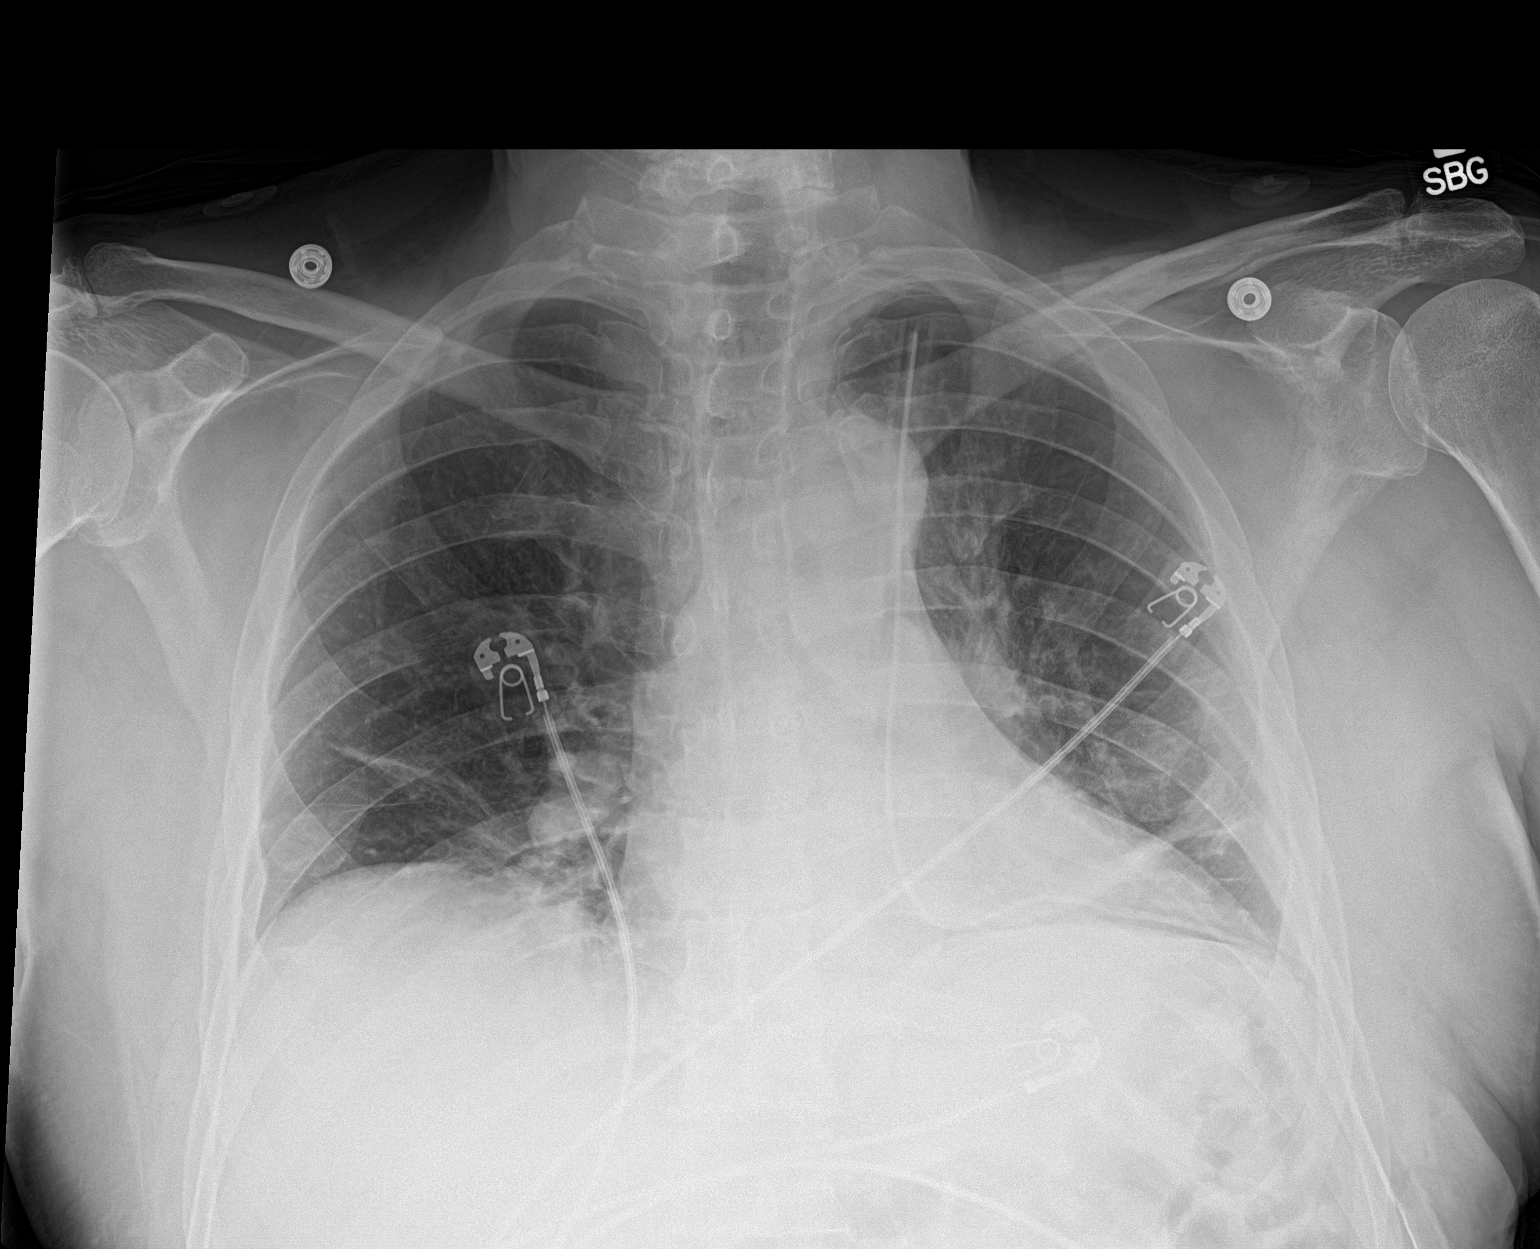

[1 of 1 positions shown; findings below may reference images not displayed]

FINDINGS: One of the patient's two left chest tubes has been removed. Tiny
left apical pneumothorax is unchanged. Mild bibasilar subsegmental
atelectasis is seen.
IMPRESSION: Status post removal of 1 of 2 left chest tubes. Tiny left apical
pneumothorax is unchanged.

Bibasilar subsegmental atelectasis.

## 2020-03-21 IMAGING — DX PORTABLE CHEST - 1 VIEW
1 series · 1 of 1 positions shown · non-contrast
Comparison: Single-view of the chest 07/07/2018 and 07/08/2018.

CLINICAL DATA: Status post chest tube removal today.

EXAM:
PORTABLE CHEST 1 VIEW

[chest]
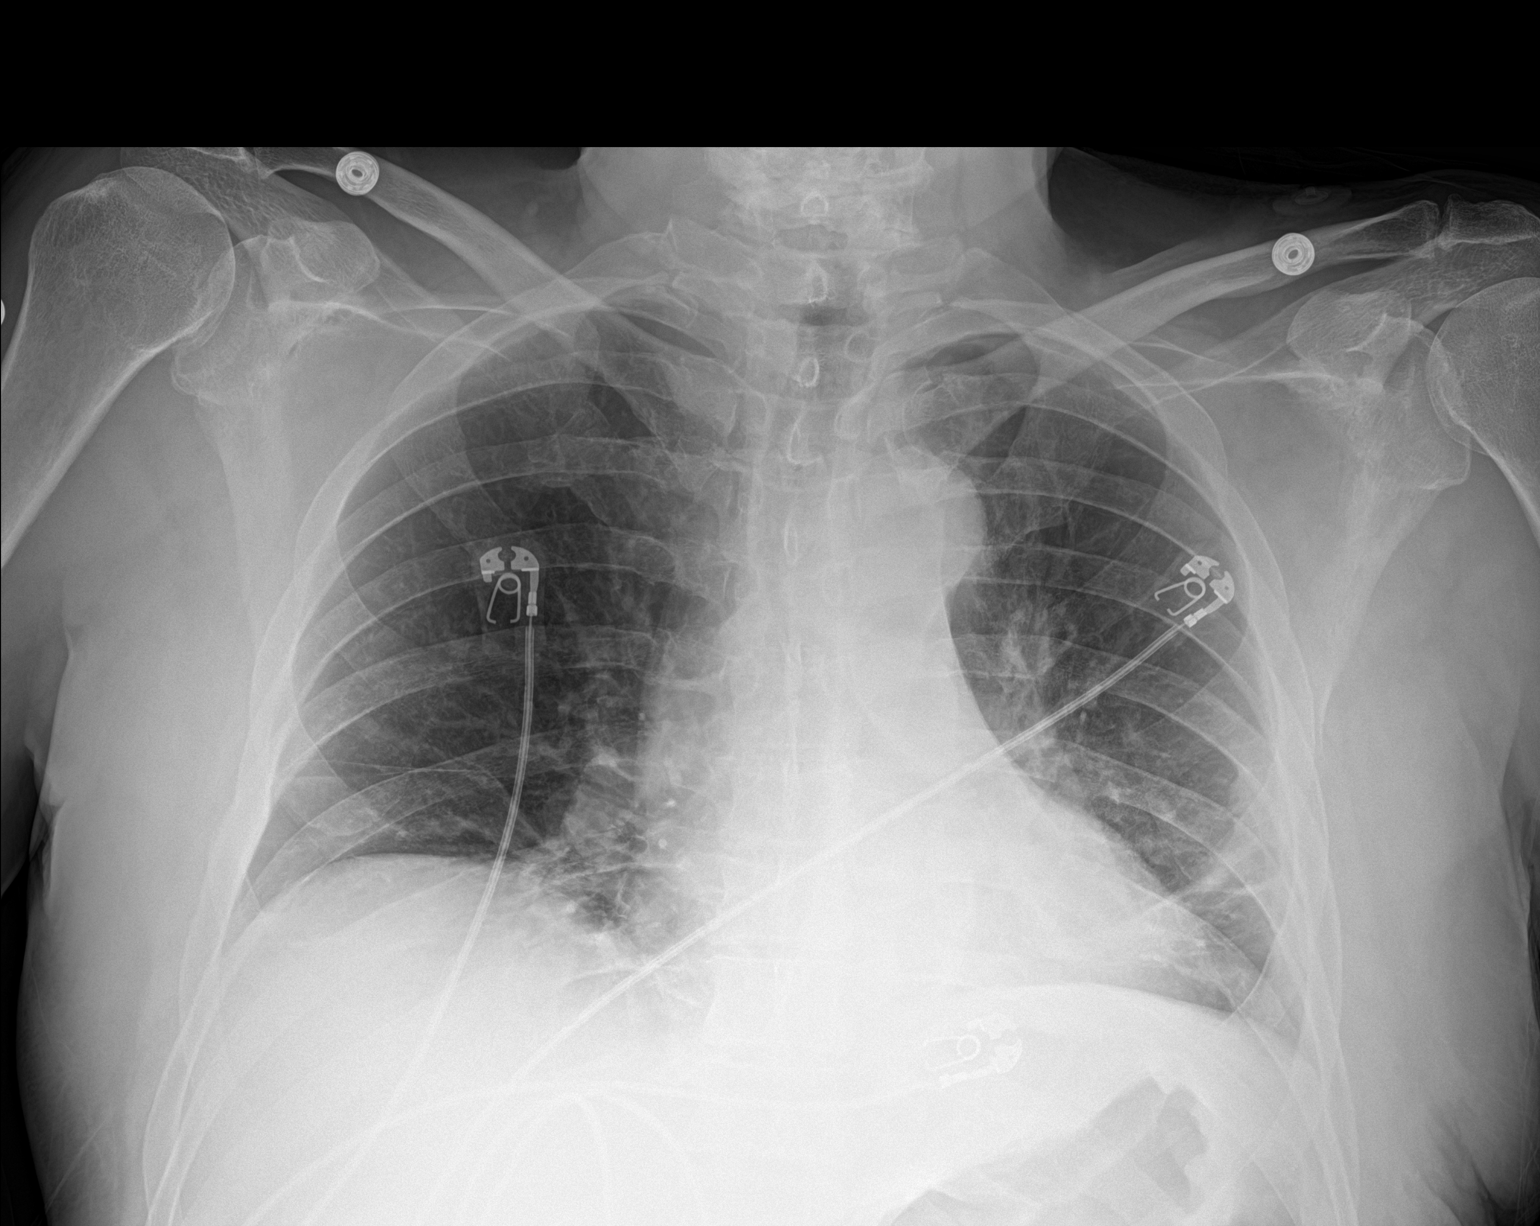

[1 of 1 positions shown; findings below may reference images not displayed]

FINDINGS: Left chest tube has been removed. Tiny left apical pneumothorax is
unchanged. Atelectasis in the lung bases is again seen and has
improved on the right. Heart size is normal.
IMPRESSION: Status post removal of left chest tube. Tiny left apical
pneumothorax is unchanged.

Bibasilar subsegmental atelectasis has improved on the right.

## 2020-03-25 DIAGNOSIS — M545 Low back pain, unspecified: Secondary | ICD-10-CM | POA: Diagnosis not present

## 2020-03-27 DIAGNOSIS — M545 Low back pain, unspecified: Secondary | ICD-10-CM | POA: Diagnosis not present

## 2020-03-28 DIAGNOSIS — M25561 Pain in right knee: Secondary | ICD-10-CM | POA: Diagnosis not present

## 2020-03-28 DIAGNOSIS — M1711 Unilateral primary osteoarthritis, right knee: Secondary | ICD-10-CM | POA: Diagnosis not present

## 2020-03-31 DIAGNOSIS — M545 Low back pain, unspecified: Secondary | ICD-10-CM | POA: Diagnosis not present

## 2020-04-04 DIAGNOSIS — M545 Low back pain, unspecified: Secondary | ICD-10-CM | POA: Diagnosis not present

## 2020-04-04 DIAGNOSIS — M1711 Unilateral primary osteoarthritis, right knee: Secondary | ICD-10-CM | POA: Diagnosis not present

## 2020-04-07 DIAGNOSIS — M5459 Other low back pain: Secondary | ICD-10-CM | POA: Diagnosis not present

## 2020-05-01 DIAGNOSIS — Z79899 Other long term (current) drug therapy: Secondary | ICD-10-CM | POA: Diagnosis not present

## 2020-05-01 DIAGNOSIS — I10 Essential (primary) hypertension: Secondary | ICD-10-CM | POA: Diagnosis not present

## 2020-05-01 DIAGNOSIS — E7800 Pure hypercholesterolemia, unspecified: Secondary | ICD-10-CM | POA: Diagnosis not present

## 2020-05-01 DIAGNOSIS — Z87891 Personal history of nicotine dependence: Secondary | ICD-10-CM | POA: Diagnosis not present

## 2020-05-01 DIAGNOSIS — Z87448 Personal history of other diseases of urinary system: Secondary | ICD-10-CM | POA: Diagnosis not present

## 2020-05-01 DIAGNOSIS — M1990 Unspecified osteoarthritis, unspecified site: Secondary | ICD-10-CM | POA: Diagnosis not present

## 2020-05-01 DIAGNOSIS — N35919 Unspecified urethral stricture, male, unspecified site: Secondary | ICD-10-CM | POA: Diagnosis not present

## 2020-05-01 DIAGNOSIS — R399 Unspecified symptoms and signs involving the genitourinary system: Secondary | ICD-10-CM | POA: Diagnosis not present

## 2020-07-25 DIAGNOSIS — Z23 Encounter for immunization: Secondary | ICD-10-CM | POA: Diagnosis not present

## 2020-08-01 DIAGNOSIS — M1711 Unilateral primary osteoarthritis, right knee: Secondary | ICD-10-CM | POA: Diagnosis not present

## 2020-08-18 DIAGNOSIS — M47816 Spondylosis without myelopathy or radiculopathy, lumbar region: Secondary | ICD-10-CM | POA: Diagnosis not present

## 2020-09-09 DIAGNOSIS — M47816 Spondylosis without myelopathy or radiculopathy, lumbar region: Secondary | ICD-10-CM | POA: Diagnosis not present

## 2020-09-16 DIAGNOSIS — E119 Type 2 diabetes mellitus without complications: Secondary | ICD-10-CM | POA: Diagnosis not present

## 2020-09-17 DIAGNOSIS — U071 COVID-19: Secondary | ICD-10-CM | POA: Diagnosis not present

## 2020-09-25 DIAGNOSIS — M47816 Spondylosis without myelopathy or radiculopathy, lumbar region: Secondary | ICD-10-CM | POA: Diagnosis not present

## 2020-10-15 DIAGNOSIS — L905 Scar conditions and fibrosis of skin: Secondary | ICD-10-CM | POA: Diagnosis not present

## 2020-10-15 DIAGNOSIS — D225 Melanocytic nevi of trunk: Secondary | ICD-10-CM | POA: Diagnosis not present

## 2020-10-15 DIAGNOSIS — L578 Other skin changes due to chronic exposure to nonionizing radiation: Secondary | ICD-10-CM | POA: Diagnosis not present

## 2020-10-15 DIAGNOSIS — L814 Other melanin hyperpigmentation: Secondary | ICD-10-CM | POA: Diagnosis not present

## 2020-10-15 DIAGNOSIS — L570 Actinic keratosis: Secondary | ICD-10-CM | POA: Diagnosis not present

## 2020-10-15 DIAGNOSIS — I8393 Asymptomatic varicose veins of bilateral lower extremities: Secondary | ICD-10-CM | POA: Diagnosis not present

## 2020-10-15 DIAGNOSIS — D1801 Hemangioma of skin and subcutaneous tissue: Secondary | ICD-10-CM | POA: Diagnosis not present

## 2020-10-30 DIAGNOSIS — I10 Essential (primary) hypertension: Secondary | ICD-10-CM | POA: Diagnosis not present

## 2020-10-30 DIAGNOSIS — R3915 Urgency of urination: Secondary | ICD-10-CM | POA: Diagnosis not present

## 2020-10-30 DIAGNOSIS — N368 Other specified disorders of urethra: Secondary | ICD-10-CM | POA: Diagnosis not present

## 2020-10-30 DIAGNOSIS — N35919 Unspecified urethral stricture, male, unspecified site: Secondary | ICD-10-CM | POA: Diagnosis not present

## 2020-10-30 DIAGNOSIS — Z87448 Personal history of other diseases of urinary system: Secondary | ICD-10-CM | POA: Diagnosis not present

## 2020-10-30 DIAGNOSIS — R399 Unspecified symptoms and signs involving the genitourinary system: Secondary | ICD-10-CM | POA: Diagnosis not present

## 2020-10-30 DIAGNOSIS — Z87891 Personal history of nicotine dependence: Secondary | ICD-10-CM | POA: Diagnosis not present

## 2020-11-15 DIAGNOSIS — U071 COVID-19: Secondary | ICD-10-CM | POA: Diagnosis not present

## 2021-01-05 DIAGNOSIS — Z23 Encounter for immunization: Secondary | ICD-10-CM | POA: Diagnosis not present

## 2021-02-02 DIAGNOSIS — K219 Gastro-esophageal reflux disease without esophagitis: Secondary | ICD-10-CM | POA: Diagnosis not present

## 2021-02-02 DIAGNOSIS — Z125 Encounter for screening for malignant neoplasm of prostate: Secondary | ICD-10-CM | POA: Diagnosis not present

## 2021-02-02 DIAGNOSIS — I2510 Atherosclerotic heart disease of native coronary artery without angina pectoris: Secondary | ICD-10-CM | POA: Diagnosis not present

## 2021-02-02 DIAGNOSIS — E669 Obesity, unspecified: Secondary | ICD-10-CM | POA: Diagnosis not present

## 2021-02-02 DIAGNOSIS — Z0000 Encounter for general adult medical examination without abnormal findings: Secondary | ICD-10-CM | POA: Diagnosis not present

## 2021-02-02 DIAGNOSIS — M1990 Unspecified osteoarthritis, unspecified site: Secondary | ICD-10-CM | POA: Diagnosis not present

## 2021-02-02 DIAGNOSIS — I10 Essential (primary) hypertension: Secondary | ICD-10-CM | POA: Diagnosis not present

## 2021-02-02 DIAGNOSIS — I700 Atherosclerosis of aorta: Secondary | ICD-10-CM | POA: Diagnosis not present

## 2021-02-02 DIAGNOSIS — N401 Enlarged prostate with lower urinary tract symptoms: Secondary | ICD-10-CM | POA: Diagnosis not present

## 2021-02-02 DIAGNOSIS — E785 Hyperlipidemia, unspecified: Secondary | ICD-10-CM | POA: Diagnosis not present

## 2021-02-16 DIAGNOSIS — B079 Viral wart, unspecified: Secondary | ICD-10-CM | POA: Diagnosis not present

## 2021-02-16 DIAGNOSIS — L570 Actinic keratosis: Secondary | ICD-10-CM | POA: Diagnosis not present

## 2021-02-16 DIAGNOSIS — L814 Other melanin hyperpigmentation: Secondary | ICD-10-CM | POA: Diagnosis not present

## 2021-02-16 DIAGNOSIS — D225 Melanocytic nevi of trunk: Secondary | ICD-10-CM | POA: Diagnosis not present

## 2021-02-16 DIAGNOSIS — D492 Neoplasm of unspecified behavior of bone, soft tissue, and skin: Secondary | ICD-10-CM | POA: Diagnosis not present

## 2021-02-16 DIAGNOSIS — L821 Other seborrheic keratosis: Secondary | ICD-10-CM | POA: Diagnosis not present

## 2021-02-18 DIAGNOSIS — R739 Hyperglycemia, unspecified: Secondary | ICD-10-CM | POA: Diagnosis not present

## 2021-02-18 DIAGNOSIS — N289 Disorder of kidney and ureter, unspecified: Secondary | ICD-10-CM | POA: Diagnosis not present

## 2021-03-25 DIAGNOSIS — I2510 Atherosclerotic heart disease of native coronary artery without angina pectoris: Secondary | ICD-10-CM | POA: Diagnosis not present

## 2021-03-25 DIAGNOSIS — M1990 Unspecified osteoarthritis, unspecified site: Secondary | ICD-10-CM | POA: Diagnosis not present

## 2021-04-18 DIAGNOSIS — Z20822 Contact with and (suspected) exposure to covid-19: Secondary | ICD-10-CM | POA: Diagnosis not present

## 2021-06-01 NOTE — Progress Notes (Signed)
Cardiology Office Note:    Date:  06/04/2021   ID:  Johnny Peck, DOB [birth year], MRN [id number]  PCP:  Kathyrn Lass, MD   [place] Providers Cardiologist:  None     Referring MD: Kathyrn Lass, MD   No chief complaint on file.   History of Present Illness:    Johnny Peck is a 76 y.o. male with a hx of hypertension, hyperlipidemia, GERD, and arthritis, here to establish care and for the evaluation of coronary artery calcification noted on CT scan. He saw Dr. Servando Snare for a left lower lobe lung mass. He underwent VATS with a mini-thoracotomy 06/2018. 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 CT scans incidentally noted coronary and aortic atherosclerosis so he was referred to cardiology.   Today, he is feeling good overall aside from some knee pain. He wants to re-establish care with cardiology after moving back to [facility].  He previously saw a cardiologist in [place] to function both this his primary care and cardiologist.  He was managed aggressively for hyperlipidemia.  He does not usually monitor his blood pressure at home as it tends to be well controlled. He reports having a calcium score 10-15 years ago, and his Crestor was doubled at that time. Occasionally he notices mild edema in his hands. 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 Generally he feels well during these projects. It is rare for him to have fried foods, and he usually avoids fast food. He may have steak about once a week. His diet often includes cheese. He is a former smoker, quit 30 years ago. He denies any palpitations, chest pain, or shortness of breath. No lightheadedness, headaches, syncope, orthopnea, or PND.   Past Medical History:  Diagnosis Date   Aortic atherosclerosis ([place]) 06/04/2021   Arthritis    CAD in native artery 06/04/2021   Detached retina    od    GERD (gastroesophageal reflux disease)    Hyperlipidemia    Hypertension    Lung nodule    Pure hypercholesterolemia 06/04/2021   Right bundle branch block 06/04/2021   Urinary incontinence     Past Surgical History:  Procedure Laterality Date   CATARACT EXTRACTION Right    EYE SURGERY     od   HERNIA REPAIR  approx 1990   right inguinal hernia repair    HERNIA REPAIR Left 2016   INGUINAL HERNIA REPAIR Left 08/09/2014   Procedure: LAPAROSCOPIC REPAIR OF LEFT INGUINAL HERNIA WITH MESH;  Surgeon: Greer Pickerel, MD;  Location: [facility];  Service: General;  Laterality: Left;   INGUINAL HERNIA REPAIR Left 02/16/2018   Procedure: OPEN REPAIR RECURRENT LEFT [place];  Surgeon: Greer Pickerel, MD;  Location: [facility];  Service: General;  Laterality: Left;   INSERTION OF MESH Left 02/16/2018   Procedure: INSERTION OF MESH;  Surgeon: Greer Pickerel, MD;  Location: [facility];  Service: General;  Laterality: Left;   IRRIGATION AND DEBRIDEMENT ABSCESS Left 03/06/2018   Procedure: IRRIGATION AND DEBRIDEMENT LEFT INGUINAL INCISION;  Surgeon: Greer Pickerel, MD;  Location: [facility];  Service: General;  Laterality: Left;   RETINAL DETACHMENT SURGERY Right    EYE CENTER    THORACOSCOPY Left 07/05/2018    VIDEO ASSISTED THORACOSCOPY (VATS)/LUNG RESECTION/ Parital Left Lower Lobectomy/ Lymph Node Dissection/ Intercostal Nerve Block (Left Chest   TONSILLECTOMY  URETHRAL DILATION  10/14/2017   ALSO DID VASECTOMY    VIDEO ASSISTED THORACOSCOPY (VATS)/WEDGE RESECTION Left 07/05/2018   Procedure: VIDEO ASSISTED THORACOSCOPY (VATS)/LUNG RESECTION/ Parital Left Lower Lobectomy/ Lymph Node Dissection/ Intercostal Nerve Block;  Surgeon: [name], MD;  Location: [facility];  Service: Thoracic;  Laterality: Left;    Current Medications: Current Meds  Medication Sig   aspirin EC 81 MG tablet Take 81 mg by mouth at bedtime.   Cholecalciferol (VITAMIN D3) 5000 units CAPS Take 5,000 Units by mouth  daily.   Coenzyme Q10 (COQ10) 200 MG CAPS Take 200 mg by mouth every morning.   CRANBERRY PO Take 1 tablet by mouth 2 (two) times daily.   diclofenac (VOLTAREN) 75 MG EC tablet Take 75 mg by mouth 2 (two) times daily.   ferrous sulfate 325 (65 FE) MG tablet Take 325 mg by mouth daily.   metoprolol succinate (TOPROL-XL) 50 MG 24 hr tablet Take 50 mg by mouth every morning. Take with or immediately following a meal.   mirabegron ER (MYRBETRIQ) 50 MG TB24 tablet Take 50 mg by mouth daily.   Misc Natural Products (PROSTATE HEALTH PO) Take 3 tablets by mouth daily.   Multiple Vitamins-Minerals (OCULAR VITAMINS) TABS Take 1 tablet by mouth daily.   OIL OF OREGANO PO Take 1 capsule by mouth 2 (two) times daily.    olmesartan (BENICAR) 20 MG tablet Take 20 mg by mouth at bedtime.   omeprazole (PRILOSEC) 20 MG capsule Take 20 mg by mouth every morning.   Probiotic CAPS Take 1 capsule by mouth daily.   psyllium (METAMUCIL) 58.6 % packet Take 1 packet by mouth daily as needed (constipation).    RESVERATROL PO Take 2 tablets by mouth daily.   rosuvastatin (CRESTOR) 40 MG tablet Take 40 mg by mouth at bedtime.    tadalafil (CIALIS) 5 MG tablet    tamsulosin (FLOMAX) 0.4 MG CAPS capsule Take 0.4 mg by mouth at bedtime.    traMADol (ULTRAM) 50 MG tablet Take 1 tablet (50 mg total) by mouth every 6 (six) hours as needed (mild pain).     Allergies:   Patient has no known allergies.   Social History   Socioeconomic History   Marital status: Married    Spouse name: Not on file   Number of children: Not on file   Years of education: Not on file   Highest education level: Not on file  Occupational History   Not on file  Tobacco Use   Smoking status: Former    Types: Cigarettes    Quit date: 12/25/1983    Years since quitting: 37.4   Smokeless tobacco: Never  Vaping Use   Vaping Use: Never used  Substance and Sexual Activity   Alcohol use: Yes    Comment: occasional    Drug use: No   Sexual  activity: Not on file  Other Topics Concern   Not on file  Social History Narrative   Not on file   Social Determinants of Health   Financial Resource Strain: Low Risk    Difficulty of Paying Living Expenses: Not hard at all  Food Insecurity: No Food Insecurity   Worried About Charity fundraiser in the Last Year: Never true   [place] in the Last Year: Never true  Transportation Needs: No Transportation Needs   Lack of Transportation (Medical): No   Lack of Transportation (Non-Medical): No  Physical Activity: Sufficiently Active   Days of Exercise  per Week: 5 days   Minutes of Exercise per Session: 60 min  Stress: Not on file  Social Connections: Not on file     Family History: The patient's family history includes Heart attack in his father, maternal grandfather, and mother; Hyperlipidemia in his father and mother; Hypertension in his father and mother.  ROS:   Please see the history of present illness.    (+) Knee pain (+) Edema of bilateral hands All other systems reviewed and are negative.  EKGs/Labs/Other Studies Reviewed:    The following studies were reviewed today:  CT Chest 08/30/2019: COMPARISON:  Chest CT 05/29/2018.  PET-CT 06/27/2018.   FINDINGS: Cardiovascular: Heart size is normal. There is no significant pericardial fluid, thickening or pericardial calcification. There is aortic atherosclerosis, as well as atherosclerosis of the great vessels of the mediastinum and the coronary arteries, including calcified atherosclerotic plaque in the left main, left anterior descending, left circumflex and right coronary arteries.   Mediastinum/Nodes: No pathologically enlarged mediastinal or hilar lymph nodes. Please note that accurate exclusion of hilar adenopathy is limited on noncontrast CT scans. Esophagus is unremarkable in appearance. No axillary lymphadenopathy.   Lungs/Pleura: New postoperative changes of wedge resection in the left lower lobe.  Previously noted pulmonary nodule has been resected. No new suspicious appearing pulmonary nodules or masses are noted. No acute consolidative airspace disease. No pleural effusions.   Upper Abdomen: Unremarkable.   Musculoskeletal: There are no aggressive appearing lytic or blastic lesions noted in the visualized portions of the skeleton.   IMPRESSION: 1. Status post wedge resection in the left lower lobe. No findings to suggest locally recurrent disease or metastatic disease in the thorax. 2. Aortic atherosclerosis, in addition to left main and 3 vessel coronary artery disease. Assessment for potential risk factor modification, dietary therapy or pharmacologic therapy may be warranted, if clinically indicated.   Aortic Atherosclerosis (ICD10-I70.0).  EKG:    06/04/2021: Sinus rhythm. Rate 59 bpm. First degree AV block. RBBB. LAFB.  Recent Labs: No results found for requested labs within last 8760 hours.   Recent Lipid Panel No results found for: CHOL, TRIG, HDL, CHOLHDL, VLDL, LDLCALC, LDLDIRECT      Physical Exam:    Wt Readings from Last 3 Encounters:  06/04/21 198 lb 1.6 oz (89.9 kg)  08/24/18 177 lb (80.3 kg)  07/05/18 188 lb (85.3 kg)     VS:  BP 118/78 (BP Location: Right Arm, Patient Position: Sitting, Cuff Size: Normal)    Pulse (!) 59    Ht 5\' 10"  (4.401 m)    Wt 198 lb 1.6 oz (89.9 kg)    BMI 28.42 kg/m  , BMI Body mass index is 28.42 kg/m. GENERAL:  Well appearing HEENT: Pupils equal round and reactive, fundi not visualized, oral mucosa unremarkable NECK:  No jugular venous distention, waveform within normal limits, carotid upstroke brisk and symmetric, no bruits, no thyromegaly LUNGS:  Clear to auscultation bilaterally HEART:  RRR.  PMI not displaced or sustained,S1 and S2 within normal limits, no S3, no S4, no clicks, no rubs, no murmurs ABD:  Flat, positive bowel sounds normal in frequency in pitch, no bruits, no rebound, no guarding, no midline pulsatile  mass, no hepatomegaly, no splenomegaly EXT:  2 plus pulses throughout, no edema, no cyanosis no clubbing SKIN:  No rashes no nodules NEURO:  Cranial nerves II through XII grossly intact, motor grossly intact throughout PSYCH:  Cognitively intact, oriented to person place and time   ASSESSMENT:  1. Primary hypertension   2. CAD in native artery   3. Aortic atherosclerosis ([place])   4. Right bundle branch block    PLAN:    Hypertension Blood pressure is very well controlled on his current regimen.  Continue with regular exercise.  Continue metoprolol and olmesartan.  CAD in native artery CTs have identified coronary and aortic atherosclerosis.  His LDL needs to be less than 70.  Continue with diet and exercise.  Continue rosuvastatin.  His most recent LDL was 75.  We will have him see our Pharm.D. to start PCSK9 inhibitor.  He is an avid advocate of prevention and I think this is the most aggressive strategy.  Aortic atherosclerosis ([facility]) Adding PCSK9 inhibitor as above.  Continue rosuvastatin and aspirin.  Pure hypercholesterolemia Lipids are reasonably controlled.  However given his diffuse atherosclerosis, his LDL really needs to be less than 70.  Continue rosuvastatin and adding PCSK9 inhibitor as above.  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  He is mildly bradycardic on metoprolol but has no lightheadedness or dizziness.  We will continue to monitor.   Disposition: FU with Xzayvier Fagin C. Oval Linsey, MD, [facility] in 6 months.  Medication Adjustments/Labs and Tests Ordered: Current medicines are reviewed at length with the patient today.  Concerns regarding medicines are outlined above.   Orders Placed This Encounter  Procedures   Lipid panel   Comprehensive metabolic panel   AMB Referral to [facility] Pharm-D   EKG 12-Lead   No orders of the defined types were placed in this encounter.  Patient Instructions   Medication Instructions:  Your physician recommends that you continue on your current medications as directed. Please refer to the Current Medication list given to you today.  *If you need a refill on your cardiac medications before your next appointment, please call your pharmacy*  Lab Work: FASTING LP/CMET IN 2-3 MONTHS   If you have labs (blood work) drawn today and your tests are completely normal, you will receive your results only by: [place] (if you have MyChart) OR A paper copy in the mail If you have any lab test that is abnormal or we need to change your treatment, we will call you to review the results.   Testing/Procedures: NONE  Follow-Up: At [facility], you and your health needs are our priority.  As part of our continuing mission to provide you with exceptional heart care, we have created designated Provider Care Teams.  These Care Teams include your primary Cardiologist (physician) and Advanced Practice Providers (APPs -  Physician Assistants and Nurse Practitioners) who all work together to provide you with the care you need, when you need it.  We recommend signing up for the patient portal called "MyChart".  Sign up information is provided on this After Visit Summary.  MyChart is used to connect with patients for Virtual Visits (Telemedicine).  Patients are able to view lab/test results, encounter notes, upcoming appointments, etc.  Non-urgent messages can be sent to your provider as well.   To learn more about what you can do with MyChart, go to [email or web address].    Your next appointment:   6 month(s)  The format for your next appointment:   In Person  Provider:   Skeet Latch, MD    Your physician recommends that you schedule a follow-up appointment in: [place]    I,Mathew Stumpf,acting as a scribe for Skeet Latch, MD.,have documented all  relevant documentation on the behalf of Skeet Latch, MD,as directed by   Skeet Latch, MD while in the presence of Skeet Latch, MD.  I, [place] Oval Linsey, MD have reviewed all documentation for this visit.  The documentation of the exam, diagnosis, procedures, and orders on 06/04/2021 are all accurate and complete.   Signed, Skeet Latch, MD  06/04/2021 5:36 PM    [place]

## 2021-06-04 ENCOUNTER — Other Ambulatory Visit: Payer: Self-pay

## 2021-06-04 ENCOUNTER — Encounter (HOSPITAL_BASED_OUTPATIENT_CLINIC_OR_DEPARTMENT_OTHER): Payer: Self-pay | Admitting: Cardiovascular Disease

## 2021-06-04 ENCOUNTER — Ambulatory Visit (INDEPENDENT_AMBULATORY_CARE_PROVIDER_SITE_OTHER): Payer: Medicare Other | Admitting: Cardiovascular Disease

## 2021-06-04 DIAGNOSIS — I10 Essential (primary) hypertension: Secondary | ICD-10-CM

## 2021-06-04 DIAGNOSIS — I2510 Atherosclerotic heart disease of native coronary artery without angina pectoris: Secondary | ICD-10-CM

## 2021-06-04 DIAGNOSIS — I4510 Unspecified right bundle-branch block: Secondary | ICD-10-CM | POA: Insufficient documentation

## 2021-06-04 DIAGNOSIS — I700 Atherosclerosis of aorta: Secondary | ICD-10-CM | POA: Diagnosis not present

## 2021-06-04 DIAGNOSIS — E7800 Pure hypercholesterolemia, unspecified: Secondary | ICD-10-CM

## 2021-06-04 HISTORY — DX: Atherosclerotic heart disease of native coronary artery without angina pectoris: I25.10

## 2021-06-04 HISTORY — DX: Unspecified right bundle-branch block: I45.10

## 2021-06-04 HISTORY — DX: Atherosclerosis of aorta: I70.0

## 2021-06-04 HISTORY — DX: Pure hypercholesterolemia, unspecified: E78.00

## 2021-06-04 NOTE — Assessment & Plan Note (Signed)
Adding PCSK9 inhibitor as above.  Continue rosuvastatin and aspirin.

## 2021-06-04 NOTE — Assessment & Plan Note (Signed)
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  He is mildly bradycardic on metoprolol but has no lightheadedness or dizziness.  We will continue to monitor.

## 2021-06-04 NOTE — Patient Instructions (Signed)
Medication Instructions:  Your physician recommends that you continue on your current medications as directed. Please refer to the Current Medication list given to you today.  *If you need a refill on your cardiac medications before your next appointment, please call your pharmacy*  Lab Work: FASTING LP/CMET IN 2-3 MONTHS   If you have labs (blood work) drawn today and your tests are completely normal, you will receive your results only by: [place] (if you have MyChart) OR A paper copy in the mail If you have any lab test that is abnormal or we need to change your treatment, we will call you to review the results.   Testing/Procedures: NONE  Follow-Up: At [facility], you and your health needs are our priority.  As part of our continuing mission to provide you with exceptional heart care, we have created designated Provider Care Teams.  These Care Teams include your primary Cardiologist (physician) and Advanced Practice Providers (APPs -  Physician Assistants and Nurse Practitioners) who all work together to provide you with the care you need, when you need it.  We recommend signing up for the patient portal called "MyChart".  Sign up information is provided on this After Visit Summary.  MyChart is used to connect with patients for Virtual Visits (Telemedicine).  Patients are able to view lab/test results, encounter notes, upcoming appointments, etc.  Non-urgent messages can be sent to your provider as well.   To learn more about what you can do with MyChart, go to [email or web address].    Your next appointment:   6 month(s)  The format for your next appointment:   In Person  Provider:   Skeet Latch, MD    Your physician recommends that you schedule a follow-up appointment in: [place]

## 2021-06-04 NOTE — Assessment & Plan Note (Signed)
Blood pressure is very well controlled on his current regimen.  Continue with regular exercise.  Continue metoprolol and olmesartan.

## 2021-06-04 NOTE — Assessment & Plan Note (Signed)
CTs have identified coronary and aortic atherosclerosis.  His LDL needs to be less than 70.  Continue with diet and exercise.  Continue rosuvastatin.  His most recent LDL was 75.  We will have him see our Pharm.D. to start PCSK9 inhibitor.  He is an avid advocate of prevention and I think this is the most aggressive strategy.

## 2021-06-04 NOTE — Assessment & Plan Note (Signed)
Lipids are reasonably controlled.  However given his diffuse atherosclerosis, his LDL really needs to be less than 70.  Continue rosuvastatin and adding PCSK9 inhibitor as above.

## 2021-07-02 ENCOUNTER — Ambulatory Visit (INDEPENDENT_AMBULATORY_CARE_PROVIDER_SITE_OTHER): Payer: Medicare Other | Admitting: Pharmacist Clinician (PhC)/ Clinical Pharmacy Specialist

## 2021-07-02 ENCOUNTER — Encounter: Payer: Self-pay | Admitting: Pharmacist Clinician (PhC)/ Clinical Pharmacy Specialist

## 2021-07-02 ENCOUNTER — Other Ambulatory Visit: Payer: Self-pay

## 2021-07-02 DIAGNOSIS — E7800 Pure hypercholesterolemia, unspecified: Secondary | ICD-10-CM | POA: Diagnosis not present

## 2021-07-02 NOTE — Patient Instructions (Addendum)
Your Results: ?           ? Your most recent labs Goal  ?Total Cholesterol 147 < 200  ?Triglycerides 134 < 150  ?HDL (happy/good cholesterol) 48 > 40  ?LDL (lousy/bad cholesterol 75 < 70   ? ?Medication changes: ? We will start the process to get Repatha covered by your insurance.  You will do one injection every 14 days.  Repeat labs after 4-6 doses (about 2-3 months.  ? ? ?Patient Assistance:  The [employer] foundation offers assistance to help pay for medication copays.  They will cover copays for all cholesterol lowering meds, including statins, fibrates, omega-3 oils, ezetimibe, Repatha, Praluent, Nexletol, Nexlizet.  The cards are usually good for $2,500 or 12 months, whichever comes first. ?Go to healthwellfoundation.org ?Click on ?Apply Now? ?Answer questions as to whom is applying (patient or representative) ?Your disease fund will be ?hypercholesterolemia - Medicare access? ?They will ask questions about finances and which medications you are taking for cholesterol ?When you submit, the approval is usually within minutes.  You will need to print the card information from the site ?You will need to show this information to your pharmacy, they will bill your Medicare Part D plan first -then bill [employer] --for the copay.   ?You can also call them at [phone], although the hold times can be quite long.  ? ?Thank you for choosing [facility]  ? ?

## 2021-07-02 NOTE — Assessment & Plan Note (Signed)
Patient with ASCVD by CT and LDL not to goal of < 70, on rosuvastatin 40 mg.  Reviewed options for lowering LDL cholesterol, including ezetimibe, PCSK-9 inhibitors, bempedoic acid and inclisiran.  Discussed mechanisms of action, dosing, side effects and potential decreases in LDL cholesterol.  Also reviewed cost information and potential options for patient assistance.  Answered all patient questions.  Based on this information, patient would prefer to start PCSK9 inhibitor.  Based on insurance will probably be Repatha - 140 mg q14d.  Will submit PA to insurance and have patient repeat labs after 2-3 months on medication.   ? ?

## 2021-07-02 NOTE — Progress Notes (Signed)
07/02/2021 ?Jona Zappone Clas[place] ?10/04/1945 ?[id number] ? ? ?HPI:  Johnny Peck is a 76 y.o. male patient of Dr Oval Linsey, who presents today for a lipid clinic evaluation.  See pertinent past medical history below.   He had a chest CT for evaluation of lung mass, and coronary calcium was noted.  He followed up with Dr. Oval Linsey, who would like to see his LDL now < 70.  He has chronic pain issues related to back and knee arthritis, for which he takes tramadol and diclofenac daily.   ? ? ?Past Medical History: ?hypertension Well controlled on olmesartan and metoporolol  ?CAD Coronary and aortic atherosclerosis by CT, LDL goal < 70  ?Right BBB Stable, chronic  ? ?Current Medications: rosuvastatin 40 ? ?Cholesterol Goals: LDL < 70 ? ?Intolerant/previously tried: none ? ?Family history: both parents with cholesterol, hypertension, died from MI, both in mid-69's; 2 brothers at least 29 with elevated cholesterol; son without cholesterol issues ? ?Diet: mostly home cooked meals; limits salt intake as much as possible, no fried foods; vegetables mostly fresh; mix of proteins ? ?Exercise:  does Architect around house - building deck now ? ?Labs: 10/22:  tC 147, TG 134, HDL 48, LDL 75 (on rosuvastatin 40) ? ? ?Current Outpatient Medications  ?Medication Sig Dispense Refill  ? aspirin EC 81 MG tablet Take 81 mg by mouth at bedtime.    ? Cholecalciferol (VITAMIN D3) 5000 units CAPS Take 5,000 Units by mouth daily.    ? Coenzyme Q10 (COQ10) 200 MG CAPS Take 200 mg by mouth every morning.    ? CRANBERRY PO Take 1 tablet by mouth 2 (two) times daily.    ? diclofenac (VOLTAREN) 75 MG EC tablet Take 75 mg by mouth 2 (two) times daily.    ? diphenhydramine-acetaminophen (TYLENOL PM) 25-500 MG TABS tablet Take 2 tablets by mouth at bedtime.    ? ferrous sulfate 325 (65 FE) MG tablet Take 325 mg by mouth daily.    ? metoprolol succinate (TOPROL-XL) 50 MG 24 hr tablet Take 50 mg by mouth every morning. Take with or immediately  following a meal.    ? mirabegron ER (MYRBETRIQ) 50 MG TB24 tablet Take 50 mg by mouth daily.    ? Misc Natural Products (GLUCOSAMINE CHOND CMP TRIPLE) TABS Take 2 tablets by mouth daily.    ? Misc Natural Products (PROSTATE HEALTH PO) Take 3 tablets by mouth daily.    ? Multiple Vitamins-Minerals (OCULAR VITAMINS) TABS Take 1 tablet by mouth daily.    ? OIL OF OREGANO PO Take 1 capsule by mouth 2 (two) times daily.     ? olmesartan (BENICAR) 20 MG tablet Take 20 mg by mouth at bedtime.    ? omeprazole (PRILOSEC) 20 MG capsule Take 20 mg by mouth every morning.    ? Probiotic CAPS Take 1 capsule by mouth daily.    ? psyllium (METAMUCIL) 58.6 % packet Take 1 packet by mouth daily as needed (constipation).     ? RESVERATROL PO Take 2 tablets by mouth daily.    ? rosuvastatin (CRESTOR) 40 MG tablet Take 40 mg by mouth at bedtime.     ? tadalafil (CIALIS) 5 MG tablet     ? tamsulosin (FLOMAX) 0.4 MG CAPS capsule Take 0.4 mg by mouth at bedtime.     ? traMADol (ULTRAM) 50 MG tablet Take 1 tablet (50 mg total) by mouth every 6 (six) hours as needed (mild pain). 28 tablet 0  ? ?No  current facility-administered medications for this visit.  ? ? ?No Known Allergies ? ?Past Medical History:  ?Diagnosis Date  ? Aortic atherosclerosis ([place]) 06/04/2021  ? Arthritis   ? CAD in native artery 06/04/2021  ? Detached retina   ? od  ? GERD (gastroesophageal reflux disease)   ? Hyperlipidemia   ? Hypertension   ? Lung nodule   ? Pure hypercholesterolemia 06/04/2021  ? Right bundle branch block 06/04/2021  ? Urinary incontinence   ? ? ?There were no vitals taken for this visit. ? ? ?Pure hypercholesterolemia ?Patient with ASCVD by CT and LDL not to goal of < 70, on rosuvastatin 40 mg.  Reviewed options for lowering LDL cholesterol, including ezetimibe, PCSK-9 inhibitors, bempedoic acid and inclisiran.  Discussed mechanisms of action, dosing, side effects and potential decreases in LDL cholesterol.  Also reviewed cost information and  potential options for patient assistance.  Answered all patient questions.  Based on this information, patient would prefer to start PCSK9 inhibitor.  Based on insurance will probably be Repatha - 140 mg q14d.  Will submit PA to insurance and have patient repeat labs after 2-3 months on medication.   ? ? ?Tommy Medal PharmD CPP [facility] ?[address] ?[place], [place] [address] ?[phone] ? ? ? ?

## 2021-07-03 ENCOUNTER — Telehealth: Payer: Self-pay

## 2021-07-03 ENCOUNTER — Telehealth (HOSPITAL_BASED_OUTPATIENT_CLINIC_OR_DEPARTMENT_OTHER): Payer: Self-pay

## 2021-07-03 ENCOUNTER — Telehealth (HOSPITAL_BASED_OUTPATIENT_CLINIC_OR_DEPARTMENT_OTHER): Payer: Self-pay | Admitting: *Deleted

## 2021-07-03 DIAGNOSIS — E7800 Pure hypercholesterolemia, unspecified: Secondary | ICD-10-CM

## 2021-07-03 MED ORDER — REPATHA SURECLICK 140 MG/ML ~~LOC~~ SOAJ: 140.0000 mg | SUBCUTANEOUS | 11 refills | Status: DC

## 2021-07-03 NOTE — Telephone Encounter (Signed)
Called and spoke w/pt and stated that they were approved for repatha, rx sent, pt instructed to call back if the med is unaffordable and to complete fasting labs post 4th dose ?

## 2021-07-03 NOTE — Telephone Encounter (Signed)
Clearance sent to preop pool 3/17 ?

## 2021-07-03 NOTE — Addendum Note (Signed)
Addended by: Allean Found on: 07/03/2021 01:34 PM ? ? Modules accepted: Orders ? ?

## 2021-07-03 NOTE — Telephone Encounter (Signed)
-----   Message from Rockne Menghini, RPH-CPP sent at 07/02/2021  9:52 AM EDT ----- ?Regarding: Repatha PA ?Please do PA for Reapatha 140 q14d ? ?

## 2021-07-03 NOTE — Telephone Encounter (Signed)
?  Johnny Peck (Johnny Peck) - [id number] ?Repatha SureClick '140MG'$ /ML auto-injectors ?Status: PA Request ?Created: March 17th, 2023 ?Sent: March 17th, 2023 ? ?Lipid/hepatic panel ordered and released. ? ?

## 2021-07-03 NOTE — Telephone Encounter (Signed)
? ?  Pre-operative Risk Assessment  ?  ?Patient Name: Johnny Peck  ?DOB: 03/30/1946 ?MRN: [id number]{ ?  ? ?  ? ?Request for Surgical Clearance   ? ?Procedure:   TKA ? ?Date of Surgery:  Clearance TBD                             { ? ?   ?Surgeon:  Dr Lara Mulch ?Surgeon's Group or Practice Name:  [facility] ?Phone number:  [phone] ?Fax number:  [phone] ?  ?Type of Clearance Requested:   ?- Medical  ?  ?Type of Anesthesia:   Unknown ?  ?Additional requests/questions:   ? ?Signed, ?Barbaraann Barthel   ?07/03/2021, 5:02 PM  ? ?

## 2021-07-06 NOTE — Telephone Encounter (Signed)
? ? ?  Name: Johnny Peck  ?DOB: 01-May-1945  ?MRN: [id number] ? ?Primary Cardiologist: Skeet Latch, MD ? ? ?Preoperative team, please contact this patient and set up a phone call appointment for further preoperative risk assessment. Please obtain consent and complete medication review. Thank you for your help. ? ? ?Ledora Bottcher, PA-C ?07/06/2021, 8:25 AM ?[phone] ?[facility] ?[address] ?[place], [place] [address] ? ? ?

## 2021-07-06 NOTE — Telephone Encounter (Signed)
?  Patient Consent for Virtual Visit  ? ? ?   ? ?Ethin Drummond Apollo has provided verbal consent on 07/06/2021 for a virtual visit (video or telephone). ? ? ?CONSENT FOR VIRTUAL VISIT FOR:  Johnny Peck  ?By participating in this virtual visit I agree to the following: ? ?I hereby voluntarily request, consent and authorize [facility] and its employed or contracted physicians, physician assistants, nurse practitioners or other licensed health care professionals (the Practitioner), to provide me with telemedicine health care services (the ?Services") as deemed necessary by the treating Practitioner. I acknowledge and consent to receive the Services by the Practitioner via telemedicine. I understand that the telemedicine visit will involve communicating with the Practitioner through live audiovisual communication technology and the disclosure of certain medical information by electronic transmission. I acknowledge that I have been given the opportunity to request an in-person assessment or other available alternative prior to the telemedicine visit and am voluntarily participating in the telemedicine visit. ? ?I understand that I have the right to withhold or withdraw my consent to the use of telemedicine in the course of my care at any time, without affecting my right to future care or treatment, and that the Practitioner or I may terminate the telemedicine visit at any time. I understand that I have the right to inspect all information obtained and/or recorded in the course of the telemedicine visit and may receive copies of available information for a reasonable fee.  I understand that some of the potential risks of receiving the Services via telemedicine include:  ?Delay or interruption in medical evaluation due to technological equipment failure or disruption; ?Information transmitted may not be sufficient (e.g. poor resolution of images) to allow for appropriate medical decision making by the  Practitioner; and/or  ?In rare instances, security protocols could fail, causing a breach of personal health information. ? ?Furthermore, I acknowledge that it is my responsibility to provide information about my medical history, conditions and care that is complete and accurate to the best of my ability. I acknowledge that Practitioner's advice, recommendations, and/or decision may be based on factors not within their control, such as incomplete or inaccurate data provided by me or distortions of diagnostic images or specimens that may result from electronic transmissions. I understand that the practice of medicine is not an exact science and that Practitioner makes no warranties or guarantees regarding treatment outcomes. I acknowledge that a copy of this consent can be made available to me via my patient portal ([facility]), or I can request a printed copy by calling the office of [facility].   ? ?I understand that my insurance will be billed for this visit.  ? ?I have read or had this consent read to me. ?I understand the contents of this consent, which adequately explains the benefits and risks of the Services being provided via telemedicine.  ?I have been provided ample opportunity to ask questions regarding this consent and the Services and have had my questions answered to my satisfaction. ?I give my informed consent for the services to be provided through the use of telemedicine in my medical care ? ?  ?

## 2021-07-07 ENCOUNTER — Encounter: Payer: Self-pay | Admitting: Physician Assistant

## 2021-07-07 ENCOUNTER — Other Ambulatory Visit: Payer: Self-pay

## 2021-07-07 ENCOUNTER — Ambulatory Visit (INDEPENDENT_AMBULATORY_CARE_PROVIDER_SITE_OTHER): Payer: Medicare Other | Admitting: Physician Assistant

## 2021-07-07 DIAGNOSIS — Z01810 Encounter for preprocedural cardiovascular examination: Secondary | ICD-10-CM

## 2021-07-07 NOTE — Progress Notes (Signed)
? ?Virtual Visit via Telephone Note  ? ?This visit type was conducted due to national recommendations for restrictions regarding the COVID-19 Pandemic (e.g. social distancing) in an effort to limit this patient's exposure and mitigate transmission in our community.  Due to his co-morbid illnesses, this patient is at least at moderate risk for complications without adequate follow up.  This format is felt to be most appropriate for this patient at this time.  The patient did not have access to video technology/had technical difficulties with video requiring transitioning to audio format only (telephone).  All issues noted in this document were discussed and addressed.  No physical exam could be performed with this format.  Please refer to the patient's chart for his  consent to telehealth for [facility]. ?Evaluation Performed:  Preoperative cardiovascular risk assessment ? ?This visit type was conducted due to national recommendations for restrictions regarding the COVID-19 Pandemic (e.g. social distancing).  This format is felt to be most appropriate for this patient at this time.  All issues noted in this document were discussed and addressed.  No physical exam was performed (except for noted visual exam findings with Video Visits).  Please refer to the patient's chart (MyChart message for video visits and phone note for telephone visits) for the patient's consent to telehealth for [facility]. ?_____________  ? ?Date:  07/07/2021  ? ?Patient ID:  Johnny Peck, DOB 21-Oct-1945, MRN [id number] ?Patient Location:  ?Home ?Provider location:   ?Office ? ?Primary Care Provider:  Nickola Major, MD ?Primary Cardiologist:  Skeet Latch, MD ? ?Chief Complaint  ?  ?76 y.o. y/o male with a h/o hypertension, hyperlipidemia, GERD, and arthritis with coronary artery calcifications noted on CT scan, who is pending TKA, and presents today for telephonic preoperative cardiovascular risk assessment. ? ?Past  Medical History  ?  ?Past Medical History:  ?Diagnosis Date  ? Aortic atherosclerosis ([place]) 06/04/2021  ? Arthritis   ? CAD in native artery 06/04/2021  ? Detached retina   ? od  ? GERD (gastroesophageal reflux disease)   ? Hyperlipidemia   ? Hypertension   ? Lung nodule   ? Pure hypercholesterolemia 06/04/2021  ? Right bundle branch block 06/04/2021  ? Urinary incontinence   ? ?Past Surgical History:  ?Procedure Laterality Date  ? CATARACT EXTRACTION Right   ? EYE SURGERY    ? od  ? HERNIA REPAIR  approx 1990  ? right inguinal hernia repair   ? HERNIA REPAIR Left 2016  ? INGUINAL HERNIA REPAIR Left 08/09/2014  ? Procedure: LAPAROSCOPIC REPAIR OF LEFT INGUINAL HERNIA WITH MESH;  Surgeon: Greer Pickerel, MD;  Location: [facility];  Service: General;  Laterality: Left;  ? INGUINAL HERNIA REPAIR Left 02/16/2018  ? Procedure: OPEN REPAIR RECURRENT LEFT INGUINAL HERNIA  ERAS PATHWAY;  Surgeon: Greer Pickerel, MD;  Location: [facility];  Service: General;  Laterality: Left;  ? INSERTION OF MESH Left 02/16/2018  ? Procedure: INSERTION OF MESH;  Surgeon: Greer Pickerel, MD;  [facility];  Service: General;  Laterality: Left;  ? IRRIGATION AND DEBRIDEMENT ABSCESS Left 03/06/2018  ? Procedure: IRRIGATION AND DEBRIDEMENT LEFT INGUINAL INCISION;  Surgeon: Greer Pickerel, MD;  [facility];  Service: General;  Laterality: Left;  ? RETINAL DETACHMENT SURGERY Right   ? EYE CENTER   ? THORACOSCOPY Left 07/05/2018  ?  VIDEO ASSISTED THORACOSCOPY (VATS)/LUNG RESECTION/ Parital Left Lower Lobectomy/ Lymph Node Dissection/ Intercostal Nerve Block (Left Chest  ? TONSILLECTOMY    ? URETHRAL  DILATION  10/14/2017  ? ALSO DID VASECTOMY   ? VIDEO ASSISTED THORACOSCOPY (VATS)/WEDGE RESECTION Left 07/05/2018  ? Procedure: VIDEO ASSISTED THORACOSCOPY (VATS)/LUNG RESECTION/ Parital Left Lower Lobectomy/ Lymph Node Dissection/ Intercostal Nerve Block;  Surgeon: [name], MD;  Location: [facility];  Service: Thoracic;  Laterality: Left;   ? ? ?Allergies ? ?No Known Allergies ? ?History of Present Illness  ?  ?Johnny Peck is a 76 y.o. male who presents via audio/video conferencing for a telehealth visit today.  Pt was last seen in cardiology clinic on 06/04/2021, by Dr. Oval Linsey.  At that time Johnny Peck was doing well.  he is now pending TKA.  Since his last visit, he has started a PCSK9i and is doing well with this. He is active with walking and is currently building a deck without angina.  ? ?CT scans related to his prior wedge resection have identified coronary and aortic atherosclerosis.  He is currently chest pain-free. ? ? ?Home Medications  ?  ?Prior to Admission medications   ?Medication Sig Start Date End Date Taking? Authorizing Provider  ?aspirin EC 81 MG tablet Take 81 mg by mouth at bedtime.    [provider]  ?Cholecalciferol (VITAMIN D3) 5000 units CAPS Take 5,000 Units by mouth daily.    [provider]  ?Coenzyme Q10 (COQ10) 200 MG CAPS Take 200 mg by mouth every morning. 07/09/18   John Giovanni, PA-C  ?CRANBERRY PO Take 1 tablet by mouth 2 (two) times daily.    [provider]  ?diclofenac (VOLTAREN) 75 MG EC tablet Take 75 mg by mouth 2 (two) times daily. 10/29/20 05/07/22  [provider]  ?diphenhydramine-acetaminophen (TYLENOL PM) 25-500 MG TABS tablet Take 2 tablets by mouth at bedtime.    [provider]  ?Evolocumab (REPATHA SURECLICK) 235 MG/ML SOAJ Inject 140 mg into the skin every 14 (fourteen) days. 07/03/21   Skeet Latch, MD  ?ferrous sulfate 325 (65 FE) MG tablet Take 325 mg by mouth daily. 04/14/18   [provider]  ?metoprolol succinate (TOPROL-XL) 50 MG 24 hr tablet Take 50 mg by mouth every morning. Take with or immediately following a meal.    [provider]  ?mirabegron ER (MYRBETRIQ) 50 MG TB24 tablet Take 50 mg by mouth daily.    [provider]  ?Misc Natural Products (GLUCOSAMINE CHOND CMP TRIPLE) TABS Take 2  tablets by mouth daily.    [provider]  ?Misc Natural Products (PROSTATE HEALTH PO) Take 3 tablets by mouth daily.    [provider]  ?Multiple Vitamins-Minerals (OCULAR VITAMINS) TABS Take 1 tablet by mouth daily.    [provider]  ?OIL OF OREGANO PO Take 1 capsule by mouth 2 (two) times daily.     [provider]  ?olmesartan (BENICAR) 20 MG tablet Take 20 mg by mouth at bedtime.    [provider]  ?omeprazole (PRILOSEC) 20 MG capsule Take 20 mg by mouth every morning.    [provider]  ?Probiotic CAPS Take 1 capsule by mouth daily.    [provider]  ?psyllium (METAMUCIL) 58.6 % packet Take 1 packet by mouth daily as needed (constipation).     [provider]  ?RESVERATROL PO Take 2 tablets by mouth daily.    [provider]  ?rosuvastatin (CRESTOR) 40 MG tablet Take 40 mg by mouth at bedtime.     [provider]  ?tadalafil (CIALIS) 5 MG tablet  07/31/18  [provider]  ?tamsulosin (FLOMAX) 0.4 MG CAPS capsule Take 0.4 mg by mouth at bedtime.     [provider]  ?traMADol (ULTRAM) 50 MG tablet Take 1 tablet (50 mg total) by mouth every 6 (six) hours as needed (mild pain). 07/09/18   John Giovanni, PA-C  ? ? ?Physical Exam  ?  ?Vital Signs:  Johnny Peck does not have vital signs available for review today. ? ?Given telephonic nature of communication, physical exam is limited. ?AAOx3. NAD. Normal affect.  Speech and respirations are unlabored. ? ?Accessory Clinical Findings  ?  ?None ? ?Assessment & Plan  ?  ?1.  Preoperative Cardiovascular Risk Assessment: ? ?He does not have a history of ischemic heart disease, PCI, or stroke. He understands he may have unrealized risk due to coronary calcification seen on CT scan.  He can complete more than 4.0 METS without angina.  He is willing to proceed.  Given disease seen on CT, would prefer to continue aspirin throughout the perioperative  period.  However, if doing so significantly increases morbidity or mortality, may hold 5 to 7 days. ? ?Therefore, based on ACC/AHA guidelines, the patient would be at acceptable risk for the planned procedure without further car

## 2021-11-14 LAB — LIPID PANEL
Chol/HDL Ratio: 1.7 ratio (ref 0.0–5.0)
Cholesterol, Total: 78 mg/dL — ABNORMAL LOW (ref 100–199)
HDL: 46 mg/dL (ref >39)
LDL Chol Calc (NIH): 13 mg/dL (ref 0–99)
Triglycerides: 96 mg/dL (ref 0–149)
VLDL Cholesterol Cal: 19 mg/dL (ref 5–40)

## 2021-11-14 LAB — COMPREHENSIVE METABOLIC PANEL
ALT: 32 IU/L (ref 0–44)
AST: 28 IU/L (ref 0–40)
Albumin/Globulin Ratio: 2.3 — ABNORMAL HIGH (ref 1.2–2.2)
Albumin: 4.5 g/dL (ref 3.8–4.8)
Alkaline Phosphatase: 52 IU/L (ref 44–121)
BUN/Creatinine Ratio: 12 (ref 10–24)
BUN: 17 mg/dL (ref 8–27)
Bilirubin Total: 0.5 mg/dL (ref 0.0–1.2)
CO2: 21 mmol/L (ref 20–29)
Calcium: 9.2 mg/dL (ref 8.6–10.2)
Chloride: 104 mmol/L (ref 96–106)
Creatinine, Ser: 1.37 mg/dL — ABNORMAL HIGH (ref 0.76–1.27)
Globulin, Total: 2 g/dL (ref 1.5–4.5)
Glucose: 124 mg/dL — ABNORMAL HIGH (ref 70–99)
Potassium: 4.6 mmol/L (ref 3.5–5.2)
Sodium: 139 mmol/L (ref 134–144)
Total Protein: 6.5 g/dL (ref 6.0–8.5)
eGFR: 54 mL/min/{1.73_m2} — ABNORMAL LOW (ref >59)

## 2021-12-08 ENCOUNTER — Other Ambulatory Visit (HOSPITAL_BASED_OUTPATIENT_CLINIC_OR_DEPARTMENT_OTHER): Payer: Self-pay | Admitting: *Deleted

## 2021-12-08 DIAGNOSIS — I10 Essential (primary) hypertension: Secondary | ICD-10-CM

## 2021-12-08 DIAGNOSIS — N289 Disorder of kidney and ureter, unspecified: Secondary | ICD-10-CM

## 2021-12-19 LAB — BASIC METABOLIC PANEL
BUN/Creatinine Ratio: 31 — ABNORMAL HIGH (ref 10–24)
BUN: 34 mg/dL — ABNORMAL HIGH (ref 8–27)
CO2: 23 mmol/L (ref 20–29)
Calcium: 9.1 mg/dL (ref 8.6–10.2)
Chloride: 107 mmol/L — ABNORMAL HIGH (ref 96–106)
Creatinine, Ser: 1.11 mg/dL (ref 0.76–1.27)
Glucose: 105 mg/dL — ABNORMAL HIGH (ref 70–99)
Potassium: 4.6 mmol/L (ref 3.5–5.2)
Sodium: 141 mmol/L (ref 134–144)
eGFR: 70 mL/min/{1.73_m2} (ref >59)

## 2022-07-29 ENCOUNTER — Other Ambulatory Visit: Payer: Self-pay | Admitting: Cardiovascular Disease

## 2022-07-30 NOTE — Telephone Encounter (Signed)
Please call pt to schedule overdue follow-up appointment with Dr. Duke Salvia for refills. Thank you!

## 2022-08-02 NOTE — Telephone Encounter (Signed)
Rx request sent to pharmacy.  

## 2022-08-02 NOTE — Telephone Encounter (Signed)
Scheduled Friday 08/06/22 at 2:45 pm with Gillian Shields, NP

## 2022-08-06 ENCOUNTER — Ambulatory Visit (INDEPENDENT_AMBULATORY_CARE_PROVIDER_SITE_OTHER): Payer: Medicare Other | Admitting: Family

## 2022-08-06 ENCOUNTER — Encounter (HOSPITAL_BASED_OUTPATIENT_CLINIC_OR_DEPARTMENT_OTHER): Payer: Self-pay | Admitting: Family

## 2022-08-06 VITALS — BP 110/70 | HR 53 | Ht 70.0 in | Wt 190.0 lb

## 2022-08-06 DIAGNOSIS — E785 Hyperlipidemia, unspecified: Secondary | ICD-10-CM | POA: Diagnosis not present

## 2022-08-06 DIAGNOSIS — I700 Atherosclerosis of aorta: Secondary | ICD-10-CM | POA: Diagnosis not present

## 2022-08-06 DIAGNOSIS — I10 Essential (primary) hypertension: Secondary | ICD-10-CM

## 2022-08-06 DIAGNOSIS — I25118 Atherosclerotic heart disease of native coronary artery with other forms of angina pectoris: Secondary | ICD-10-CM

## 2022-08-06 DIAGNOSIS — I4510 Unspecified right bundle-branch block: Secondary | ICD-10-CM

## 2022-08-06 MED ORDER — REPATHA SURECLICK 140 MG/ML ~~LOC~~ SOAJ
140.0000 mg | SUBCUTANEOUS | 3 refills | Status: DC
Start: 2022-08-06 — End: 2022-09-09

## 2022-08-06 NOTE — Progress Notes (Unsigned)
Office Visit    Patient Name: Johnny Peck Date of Encounter: 08/06/2022  PCP:  Gwenlyn Found, MD   [facility]  Cardiologist:  Chilton Si, MD  Advanced Practice Provider:  No care team member to display Electrophysiologist:  None      Chief Complaint    Johnny Peck is a 77 y.o. male presents today for follow up of coronary artery disease.    Past Medical History    Past Medical History:  Diagnosis Date   Aortic atherosclerosis 06/04/2021   Arthritis    CAD in native artery 06/04/2021   Detached retina    od   GERD (gastroesophageal reflux disease)    Hyperlipidemia    Hypertension    Lung nodule    Pure hypercholesterolemia 06/04/2021   Right bundle branch block 06/04/2021   Urinary incontinence    Past Surgical History:  Procedure Laterality Date   CATARACT EXTRACTION Right    EYE SURGERY     od   HERNIA REPAIR  approx 1990   right inguinal hernia repair    HERNIA REPAIR Left 2016   INGUINAL HERNIA REPAIR Left 08/09/2014   Procedure: LAPAROSCOPIC REPAIR OF LEFT INGUINAL HERNIA WITH MESH;  Surgeon: Gaynelle Adu, MD;  [facility];  Service: General;  Laterality: Left;   INGUINAL HERNIA REPAIR Left 02/16/2018   Procedure: OPEN REPAIR RECURRENT LEFT INGUINAL HERNIA  ERAS PATHWAY;  Surgeon: Gaynelle Adu, MD;  [facility];  Service: General;  Laterality: Left;   INSERTION OF MESH Left 02/16/2018   Procedure: INSERTION OF MESH;  Surgeon: Gaynelle Adu, MD;  [facility];  Service: General;  Laterality: Left;   IRRIGATION AND DEBRIDEMENT ABSCESS Left 03/06/2018   Procedure: IRRIGATION AND DEBRIDEMENT LEFT INGUINAL INCISION;  Surgeon: Gaynelle Adu, MD;  [facility];  Service: General;  Laterality: Left;   RETINAL DETACHMENT SURGERY Right    EYE CENTER    THORACOSCOPY Left 07/05/2018    VIDEO ASSISTED THORACOSCOPY (VATS)/LUNG RESECTION/ Parital Left Lower Lobectomy/ Lymph Node Dissection/ Intercostal  Nerve Block (Left Chest   TONSILLECTOMY     URETHRAL DILATION  10/14/2017   ALSO DID VASECTOMY    VIDEO ASSISTED THORACOSCOPY (VATS)/WEDGE RESECTION Left 07/05/2018   Procedure: VIDEO ASSISTED THORACOSCOPY (VATS)/LUNG RESECTION/ Parital Left Lower Lobectomy/ Lymph Node Dissection/ Intercostal Nerve Block;  Surgeon: Delight Ovens, MD;  Location: [facility];  Service: Thoracic;  Laterality: Left;    Allergies  No Known Allergies  History of Present Illness    Johnny Peck is a 77 y.o. male with a hx of GERD, HTN, HLD, arhtiritis, coronary calcification last seen 07/07/21.  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 CT inadvertently noted coronary and aortic atherosclerosis and has followed with Dr. Duke Salvia since that time.   Presents today for follow up independently. Recently enjoyed a trip to [place] and [place]. Reports no shortness of breath nor dyspnea on exertion. Reports no chest pain, pressure, or tightness. No edema, orthopnea, PND. Reports no palpitations.    EKGs/Labs/Other Studies Reviewed:   The following studies were reviewed today:       EKG:  EKG is  ordered today.  The ekg ordered today demonstrates SB 53 bpm with stable RBBB.  Recent Labs: 11/13/2021: ALT 32 12/18/2021: BUN 34; Creatinine, Ser 1.11; Potassium 4.6; Sodium 141  Recent Lipid Panel    Component Value Date/Time   CHOL  78 (L) 11/13/2021 0852   TRIG 96 11/13/2021 0852   HDL 46 11/13/2021 0852   CHOLHDL 1.7 11/13/2021 0852   LDLCALC 13 11/13/2021 0852     Home Medications   Current Meds  Medication Sig   amoxicillin-clavulanate (AUGMENTIN) 875-125 MG tablet Take 1 tablet by mouth 2 (two) times daily.   aspirin EC 81 MG tablet Take 81 mg by mouth at bedtime.   Cholecalciferol (VITAMIN D3) 5000 units CAPS Take 5,000 Units by mouth daily.   Coenzyme Q10 (COQ10) 200 MG CAPS Take 200 mg by mouth every morning.   CRANBERRY PO Take 1  tablet by mouth 2 (two) times daily.   diphenhydramine-acetaminophen (TYLENOL PM) 25-500 MG TABS tablet Take 2 tablets by mouth at bedtime.   Evolocumab (REPATHA SURECLICK) 140 MG/ML SOAJ Inject 140 mg into the skin every 14 (fourteen) days. Please keep your upcoming appointment for refills.   ferrous sulfate 325 (65 FE) MG tablet Take 325 mg by mouth daily.   metoprolol succinate (TOPROL-XL) 50 MG 24 hr tablet Take 50 mg by mouth every morning. Take with or immediately following a meal.   mirabegron ER (MYRBETRIQ) 50 MG TB24 tablet Take 50 mg by mouth daily.   Misc Natural Products (GLUCOSAMINE CHOND CMP TRIPLE) TABS Take 2 tablets by mouth daily.   Misc Natural Products (PROSTATE HEALTH PO) Take 3 tablets by mouth daily.   Multiple Vitamins-Minerals (OCULAR VITAMINS) TABS Take 1 tablet by mouth daily.   OIL OF OREGANO PO Take 1 capsule by mouth 2 (two) times daily.    olmesartan (BENICAR) 20 MG tablet Take 20 mg by mouth at bedtime.   omeprazole (PRILOSEC) 20 MG capsule Take 20 mg by mouth every morning.   Probiotic CAPS Take 1 capsule by mouth daily.   psyllium (METAMUCIL) 58.6 % packet Take 1 packet by mouth daily as needed (constipation).    RESVERATROL PO Take 2 tablets by mouth daily.   rosuvastatin (CRESTOR) 40 MG tablet Take 40 mg by mouth at bedtime.    tadalafil (CIALIS) 5 MG tablet    tamsulosin (FLOMAX) 0.4 MG CAPS capsule Take 0.4 mg by mouth at bedtime.    traMADol (ULTRAM) 50 MG tablet Take 1 tablet (50 mg total) by mouth every 6 (six) hours as needed (mild pain).     Review of Systems      All other systems reviewed and are otherwise negative except as noted above.  Physical Exam    VS:  BP 110/70   Pulse (!) 53   Ht 5\' 10"  (1.778 m)   Wt 190 lb (86.2 kg)   BMI 27.26 kg/m  , BMI Body mass index is 27.26 kg/m.  Wt Readings from Last 3 Encounters:  08/06/22 190 lb (86.2 kg)  06/04/21 198 lb 1.6 oz (89.9 kg)  08/24/18 177 lb (80.3 kg)    GEN: Well nourished, well  developed, in no acute distress. HEENT: normal. Neck: Supple, no JVD, carotid bruits, or masses. Cardiac: RRR, no murmurs, rubs, or gallops. No clubbing, cyanosis, edema.  Radials/PT 2+ and equal bilaterally.  Respiratory:  Respirations regular and unlabored, clear to auscultation bilaterally. GI: Soft, nontender, nondistended. MS: No deformity or atrophy. Skin: Warm and dry, no rash. Neuro:  Strength and sensation are intact. Psych: Normal affect.  Assessment & Plan    HTN - Relative hypotension today but asymptomatic. Continue present dose Olmesartan 20mg  daily and Toprol 50mg  daily.  CAD - Stable with no anginal symptoms. No indication for  ischemic evaluation.  GDMT aspirin, metoprolol, rosuvastatin, repatha. Heart healthy diet and regular cardiovascular exercise encouraged.    LDL goal <70 - 08/06/22 LDL 35. Continue Repatha and Crestor. Refills provided.   RBBB - Stable finding by EKG. No lightheadedness, dizziness. Continue to monitor with periodic EKG.  Bradycardia - EKG today SB 53 bpm. Asymptomatic with no lightheadedness, dizziness. No indication for further workup. May continue same dose Toprol.         Disposition: Follow up in 1 year(s) with Chilton Si, MD or APP.  Signed, Alver Sorrow, NP 08/06/2022, 2:59 PM [facility]

## 2022-08-06 NOTE — Patient Instructions (Signed)
Medication Instructions:  Your physician recommends that you continue on your current medications as directed. Please refer to the Current Medication list given to you today.  We have refilled your Repatha today!   *If you need a refill on your cardiac medications before your next appointment, please call your pharmacy*  Follow-Up: At [facility], you and your health needs are our priority.  As part of our continuing mission to provide you with exceptional heart care, we have created designated Provider Care Teams.  These Care Teams include your primary Cardiologist (physician) and Advanced Practice Providers (APPs -  Physician Assistants and Nurse Practitioners) who all work together to provide you with the care you need, when you need it.  We recommend signing up for the patient portal called "MyChart".  Sign up information is provided on this After Visit Summary.  MyChart is used to connect with patients for Virtual Visits (Telemedicine).  Patients are able to view lab/test results, encounter notes, upcoming appointments, etc.  Non-urgent messages can be sent to your provider as well.   To learn more about what you can do with MyChart, go to [email or web address].    Your next appointment:   1 year(s)  Provider:   Chilton Si, MD

## 2022-08-11 ENCOUNTER — Encounter (HOSPITAL_BASED_OUTPATIENT_CLINIC_OR_DEPARTMENT_OTHER): Payer: Self-pay | Admitting: Family

## 2022-09-07 ENCOUNTER — Encounter (HOSPITAL_BASED_OUTPATIENT_CLINIC_OR_DEPARTMENT_OTHER): Payer: Self-pay

## 2022-09-08 ENCOUNTER — Other Ambulatory Visit: Payer: Self-pay | Admitting: Cardiovascular Disease

## 2022-09-08 DIAGNOSIS — E785 Hyperlipidemia, unspecified: Secondary | ICD-10-CM

## 2023-08-06 ENCOUNTER — Other Ambulatory Visit: Payer: Self-pay | Admitting: Family

## 2023-08-06 DIAGNOSIS — E785 Hyperlipidemia, unspecified: Secondary | ICD-10-CM

## 2023-09-01 ENCOUNTER — Telehealth (HOSPITAL_BASED_OUTPATIENT_CLINIC_OR_DEPARTMENT_OTHER): Payer: Self-pay | Admitting: Family

## 2023-09-01 DIAGNOSIS — E875 Hyperkalemia: Secondary | ICD-10-CM

## 2023-09-01 MED ORDER — OLMESARTAN MEDOXOMIL 20 MG PO TABS: 10.0000 mg | ORAL_TABLET | Freq: Every day | ORAL | 3 refills | Status: DC

## 2023-09-01 NOTE — Telephone Encounter (Signed)
 Returned call to patient, provided updated recommendations.

## 2023-09-01 NOTE — Telephone Encounter (Signed)
 Recommend reduce Olmesartan to half tablet daily. This will help to lower potassium level. Repeat BMET in 7-10 days for monitoring. If BP routinely >130/80, let us  know.   Avaiyah Strubel S Maxine Fredman, NP

## 2023-09-01 NOTE — Telephone Encounter (Signed)
 Called pt, reviewed recommendations. Patient states his blood pressure has been very good, 110/120/50-60. Advised pt we will call back if changes are to be made.

## 2023-09-01 NOTE — Telephone Encounter (Signed)
 Labs received from PCP team. Potassium mildly elevated 5.3. Cholesterol at goal.   Will ask nursing team to reach out to Dr. Arman Berlin:  Ensure not taking potassium supplement Avoid salt substitute as often high in potassium Obtain recent home BP readings to reassess antihypertensive regimen (if not checking regularly, recommend check BP once per day and call back with readings in one week)  Clearnce Curia, NP

## 2023-09-01 NOTE — Addendum Note (Signed)
 Addended by: Guss Legacy on: 09/01/2023 01:27 PM   Modules accepted: Orders

## 2023-09-05 NOTE — Telephone Encounter (Signed)
 Olmesartan  doesn't come in a 10mg  tablet, is there an alternative he can take?

## 2023-09-05 NOTE — Telephone Encounter (Signed)
 Pt stated since being advised to "reduce Olmesartan  to half tablet daily." Its been very hard to cut medication in half so he'd like a callback to discuss if getting a new prescription in 10mg  instead would be an option due to the size of them being so tiny. Please advise

## 2023-09-06 MED ORDER — LOSARTAN POTASSIUM 25 MG PO TABS: 25.0000 mg | ORAL_TABLET | Freq: Every day | ORAL | 11 refills | Status: DC

## 2023-09-06 NOTE — Telephone Encounter (Signed)
 Returned call to pt, provided recommendations- pt is agreeable. Pt is currently in [place]  for work- Rx sent to [facility] in [place] . Pt will test new medication and once we know it works can send to mail in pharmacy. Advised pt of lab work.

## 2023-09-06 NOTE — Telephone Encounter (Signed)
 Stop olmesartan . Start losartan 25mg  daily. Ensure he gets repeat BMET 5-7 days after starting.   Natalee Tomkiewicz S Ozell Juhasz, NP

## 2023-09-06 NOTE — Addendum Note (Signed)
 Addended by: Guss Legacy on: 09/06/2023 08:41 AM   Modules accepted: Orders

## 2023-09-26 ENCOUNTER — Other Ambulatory Visit (HOSPITAL_COMMUNITY): Payer: Self-pay | Admitting: Family Medicine

## 2023-09-26 LAB — BASIC METABOLIC PANEL WITH GFR
BUN/Creatinine Ratio: 24 (ref 10–24)
BUN: 29 mg/dL — ABNORMAL HIGH (ref 8–27)
CO2: 20 mmol/L (ref 20–29)
Calcium: 9.2 mg/dL (ref 8.6–10.2)
Chloride: 105 mmol/L (ref 96–106)
Creatinine, Ser: 1.23 mg/dL (ref 0.76–1.27)
Glucose: 116 mg/dL — ABNORMAL HIGH (ref 70–99)
Potassium: 4.5 mmol/L (ref 3.5–5.2)
Sodium: 140 mmol/L (ref 134–144)
eGFR: 61 mL/min/{1.73_m2} (ref >59)

## 2023-09-27 ENCOUNTER — Ambulatory Visit (HOSPITAL_BASED_OUTPATIENT_CLINIC_OR_DEPARTMENT_OTHER): Payer: Self-pay | Admitting: Family

## 2024-01-13 ENCOUNTER — Encounter (HOSPITAL_BASED_OUTPATIENT_CLINIC_OR_DEPARTMENT_OTHER): Payer: Self-pay | Admitting: Cardiovascular Disease

## 2024-01-13 ENCOUNTER — Ambulatory Visit (HOSPITAL_BASED_OUTPATIENT_CLINIC_OR_DEPARTMENT_OTHER): Admitting: Cardiovascular Disease

## 2024-01-13 VITALS — BP 116/64 | HR 54 | Resp 17 | Ht 70.0 in | Wt 188.0 lb

## 2024-01-13 DIAGNOSIS — I2510 Atherosclerotic heart disease of native coronary artery without angina pectoris: Secondary | ICD-10-CM | POA: Diagnosis not present

## 2024-01-13 DIAGNOSIS — E7800 Pure hypercholesterolemia, unspecified: Secondary | ICD-10-CM

## 2024-01-13 DIAGNOSIS — I10 Essential (primary) hypertension: Secondary | ICD-10-CM

## 2024-01-13 DIAGNOSIS — I700 Atherosclerosis of aorta: Secondary | ICD-10-CM

## 2024-01-13 DIAGNOSIS — I4510 Unspecified right bundle-branch block: Secondary | ICD-10-CM | POA: Diagnosis not present

## 2024-01-13 MED ORDER — LOSARTAN POTASSIUM 25 MG PO TABS: 25.0000 mg | ORAL_TABLET | Freq: Every day | ORAL | 3 refills | Status: AC

## 2024-01-13 NOTE — Patient Instructions (Addendum)
 Medication Instructions:  Your physician recommends that you continue on your current medications as directed. Please refer to the Current Medication list given to you today.   *If you need a refill on your cardiac medications before your next appointment, please call your pharmacy*  Lab Work: NONE  Testing/Procedures: NONE  Follow-Up: At [facility], you and your health needs are our priority.  As part of our continuing mission to provide you with exceptional heart care, our providers are all part of one team.  This team includes your primary Cardiologist (physician) and Advanced Practice Providers or APPs (Physician Assistants and Nurse Practitioners) who all work together to provide you with the care you need, when you need it.  Your next appointment:   12 month(s)  Provider:   Annabella Scarce, MD, Rosaline Bane, NP, or Reche Finder, NP   We recommend signing up for the patient portal called MyChart.  Sign up information is provided on this After Visit Summary.  MyChart is used to connect with patients for Virtual Visits (Telemedicine).  Patients are able to view lab/test results, encounter notes, upcoming appointments, etc.  Non-urgent messages can be sent to your provider as well.   To learn more about what you can do with MyChart, go to [email or web address].           [email or web address] to look for an approved BP cuff.  Dr. Alusio for knee surgery.

## 2024-01-13 NOTE — Progress Notes (Signed)
 Cardiology Office Note:  .   Date:  01/13/2024  ID:  Johnny Peck, DOB 11/30/1945, MRN [id number] PCP: Anita Bernardino BROCKS, FNP  [facility] Providers Cardiologist:  Annabella Scarce, MD    History of Present Illness: .    Johnny Peck is a 78 y.o. male with a hx of nonobstructive CAD, hypertension, hyperlipidemia, GERD, and arthritis, here for follow up.  He was seen 05/2021 to establish care and for the evaluation of coronary artery calcification noted on CT scan. He saw Dr. Army for a left lower lobe lung mass. He underwent VATS with a mini-thoracotomy 06/2018. 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 CT scans incidentally noted coronary and aortic atherosclerosis so he was referred to cardiology.  He was previously seen by cardiologist in [place]  for hyperlipidemia.  He reported a remote history of having a calcium  score and his lipid goal was lowered at that time.  At his initial visit a PCSK9 inhibitor was added to his rosuvastatin  and aspirin .  His ARB was reduced in the setting of hyperkalemia.  Discussed the use of AI scribe software for clinical note transcription with the patient, who gave verbal consent to proceed.  History of Present Illness Johnny Peck has noted slightly elevated blood pressure readings at home, although today's reading was consistent with previous levels on his old medication. He is currently taking olmesartan  instead of losartan . He questions the accuracy of his home blood pressure monitor, which he purchased online.  He is on a regimen of rosuvastatin  and Repatha  for hyperlipidemia. No muscle aches or other symptoms related to his cholesterol medications are reported.  He experiences joint pain, which affects his ability to exercise. Despite this, he manages to walk between 6,000 and 10,000 steps daily and engages in chair yoga. No chest pain or breathing difficulties during physical activity are noted.  He wants to have knee replacement surgery,  which was previously scheduled but postponed due to the surgeon's relocation. He is seeking recommendations for a new orthopedic knee surgeon.  His past medical history includes a CT scan from 2021 showing plaque in his arteries and aorta. No new symptoms are reported.   ROS:  As per HPI  Studies Reviewed: SABRA   EKG Interpretation Date/Time:  Friday January 13 2024 08:10:49 EDT Ventricular Rate:  56 PR Interval:  304 QRS Duration:  156 QT Interval:  466 QTC Calculation: 449 R Axis:   -42  Text Interpretation: Sinus bradycardia with 1st degree A-V block Left axis deviation Right bundle branch block When compared with ECG of 03-Jul-2018 15:03, No significant change was found Confirmed by Scarce Annabella ([id number]) on 01/13/2024 8:16:05 AM   Chest CT 08/2019: IMPRESSION: 1. Status post wedge resection in the left lower lobe. No findings to suggest locally recurrent disease or metastatic disease in the thorax. 2. Aortic atherosclerosis, in addition to left main and 3 vessel coronary artery disease. Assessment for potential risk factor modification, dietary therapy or pharmacologic therapy may be warranted, if clinically indicated.    Risk Assessment/Calculations:             Physical Exam:   VS:  BP 116/64 (BP Location: Left Arm, Patient Position: Sitting, Cuff Size: Normal)   Pulse (!) 54   Resp 17   Ht 5' 10 (1.778 m)   Wt 188 lb (85.3 kg)   SpO2 97%   BMI 26.98 kg/m  , BMI Body mass index is 26.98 kg/m. GENERAL:  Well appearing HEENT: Pupils  equal round and reactive, fundi not visualized, oral mucosa unremarkable NECK:  No jugular venous distention, waveform within normal limits, carotid upstroke brisk and symmetric, no bruits, no thyromegaly LUNGS:  Clear to auscultation bilaterally HEART:  RRR.  PMI not displaced or sustained,S1 and S2 within normal limits, no S3, no S4, no clicks, no rubs, no murmurs ABD:  Flat, positive bowel sounds normal in frequency in pitch, no  bruits, no rebound, no guarding, no midline pulsatile mass, no hepatomegaly, no splenomegaly EXT:  2 plus pulses throughout, no edema, no cyanosis no clubbing SKIN:  No rashes no nodules NEURO:  Cranial nerves II through XII grossly intact, motor grossly intact throughout PSYCH:  Cognitively intact, oriented to person place and time   ASSESSMENT AND PLAN: .    Assessment & Plan # Atherosclerotic heart disease of native coronary artery without angina No angina symptoms. Good exercise tolerance. Old right bundle branch block on EKG not concerning. Current management effective. No additional cardiac testing needed before knee surgery. - Continue aspirin , rosuvastatin , and Repatha . - No additional cardiac testing required before knee surgery.  # Atherosclerosis of aorta Previously identified aortic plaque. Current management effective. - Continue aspirin  and cholesterol medications.  # Pure hypercholesterolemia Cholesterol well-controlled with rosuvastatin  and Repatha . High-intensity statins and Repatha  may aid plaque regression. - Continue rosuvastatin  and Repatha .  # Essential hypertension Home blood pressure slightly elevated. Current medication effective. Home monitor accuracy questionable. - Continue current antihypertensive medication. - Recommend checking the validity of the home blood pressure monitor on [email or web address]. - Send prescription to mail order for a 90-day supply.  # Right bundle-branch block Old right bundle branch block not concerning. EKG stable. - No specific intervention required.       Dispo: f/u 1 year  Signed, Annabella Scarce, MD
